# Patient Record
Sex: Female | Born: 1960 | Hispanic: Yes | Marital: Married | Smoking: Never smoker
Health system: Southern US, Community
[De-identification: ages and names within clinical notes are randomized; demographics above are authoritative.]

## PROBLEM LIST (undated history)

## (undated) DIAGNOSIS — I10 Essential (primary) hypertension: Secondary | ICD-10-CM

## (undated) DIAGNOSIS — C801 Malignant (primary) neoplasm, unspecified: Secondary | ICD-10-CM

---

## 2005-07-27 ENCOUNTER — Emergency Department (HOSPITAL_COMMUNITY): Admission: EM | Admit: 2005-07-27 | Discharge: 2005-07-27 | Payer: Self-pay | Admitting: Family Medicine

## 2006-09-06 ENCOUNTER — Emergency Department (HOSPITAL_COMMUNITY): Admission: EM | Admit: 2006-09-06 | Discharge: 2006-09-06 | Payer: Self-pay | Admitting: Family Medicine

## 2006-12-05 ENCOUNTER — Emergency Department (HOSPITAL_COMMUNITY): Admission: EM | Admit: 2006-12-05 | Discharge: 2006-12-05 | Payer: Self-pay | Admitting: Family Medicine

## 2010-02-27 ENCOUNTER — Ambulatory Visit: Payer: Self-pay | Admitting: Diagnostic Radiology

## 2010-02-27 ENCOUNTER — Emergency Department (HOSPITAL_BASED_OUTPATIENT_CLINIC_OR_DEPARTMENT_OTHER): Admission: EM | Admit: 2010-02-27 | Discharge: 2010-02-27 | Payer: Self-pay | Admitting: Emergency Medicine

## 2011-06-29 ENCOUNTER — Other Ambulatory Visit (HOSPITAL_COMMUNITY): Payer: Self-pay | Admitting: Family Medicine

## 2011-06-29 DIAGNOSIS — Z139 Encounter for screening, unspecified: Secondary | ICD-10-CM

## 2011-07-01 ENCOUNTER — Ambulatory Visit (HOSPITAL_COMMUNITY)
Admission: RE | Admit: 2011-07-01 | Discharge: 2011-07-01 | Disposition: A | Discharge status: Home / Self Care | Payer: Self-pay | Source: Ambulatory Visit | Attending: Family Medicine | Admitting: Family Medicine

## 2011-07-01 DIAGNOSIS — Z139 Encounter for screening, unspecified: Secondary | ICD-10-CM

## 2015-01-21 ENCOUNTER — Other Ambulatory Visit (HOSPITAL_COMMUNITY): Payer: Self-pay | Admitting: Family

## 2015-01-21 DIAGNOSIS — Z1231 Encounter for screening mammogram for malignant neoplasm of breast: Secondary | ICD-10-CM

## 2015-01-27 ENCOUNTER — Ambulatory Visit (HOSPITAL_COMMUNITY)
Admission: RE | Admit: 2015-01-27 | Discharge: 2015-01-27 | Disposition: A | Discharge status: Home / Self Care | Payer: Self-pay | Source: Ambulatory Visit | Attending: Family | Admitting: Family

## 2015-01-27 DIAGNOSIS — Z1231 Encounter for screening mammogram for malignant neoplasm of breast: Secondary | ICD-10-CM

## 2018-03-15 ENCOUNTER — Ambulatory Visit (INDEPENDENT_AMBULATORY_CARE_PROVIDER_SITE_OTHER): Payer: Worker's Compensation

## 2018-03-15 ENCOUNTER — Ambulatory Visit (INDEPENDENT_AMBULATORY_CARE_PROVIDER_SITE_OTHER): Payer: Worker's Compensation | Admitting: Orthopaedic Surgery

## 2018-03-15 ENCOUNTER — Encounter (INDEPENDENT_AMBULATORY_CARE_PROVIDER_SITE_OTHER): Payer: Self-pay | Admitting: Orthopaedic Surgery

## 2018-03-15 VITALS — BP 136/81 | HR 95 | Ht 61.0 in | Wt 164.0 lb

## 2018-03-15 DIAGNOSIS — M25511 Pain in right shoulder: Secondary | ICD-10-CM | POA: Diagnosis not present

## 2018-03-15 DIAGNOSIS — M545 Low back pain: Secondary | ICD-10-CM

## 2018-03-15 DIAGNOSIS — M25561 Pain in right knee: Secondary | ICD-10-CM

## 2018-03-15 DIAGNOSIS — M41126 Adolescent idiopathic scoliosis, lumbar region: Secondary | ICD-10-CM

## 2018-03-15 MED ORDER — DICLOFENAC SODIUM 75 MG PO TBEC: 75.0000 mg | DELAYED_RELEASE_TABLET | Freq: Two times a day (BID) | ORAL | 0 refills | Status: DC

## 2018-03-15 NOTE — Progress Notes (Signed)
Office Visit Note/Orthopedic consultation   Patient: Michele Arnold           Date of Birth: 14-Oct-1961           MRN: [id number] Visit Date: 03/15/2018              Requested by: Royce Macadamia D., PA-C [address] [place], [place] [address] PCP: Raiford Simmonds., PA-C   Assessment & Plan: Visit Diagnoses:  1. Acute pain of right shoulder   2. Acute pain of right knee   3. Acute right-sided low back pain, with sciatica presence unspecified   4. Adolescent idiopathic scoliosis of lumbar region     Plan: Patient can continue working as she was with some sitting and some standing.  I plan to check her back again in 1 month.  Voltaren 75 mg 1 p.o. twice daily with food prescribed.  She can stop the Flexeril.  She can continue to use some heat as she has been doing after work. Thank you for the opportunity to see her in consultation. I plan to recheck her in 1 month. We discussed through the interpreter that her pre-existing scoliosis and lumbar facet arthritis may take longer for her symptoms to resolve.   Follow-Up Instructions: Return in about 1 month (around 04/15/2018).   Orders:  Orders Placed This Encounter  Procedures  . XR Lumbar Spine 2-3 Views  . XR Knee 1-2 Views Right  . XR Shoulder Right   Meds ordered this encounter  Medications  . diclofenac (VOLTAREN) 75 MG EC tablet    Sig: Take 1 tablet (75 mg total) by mouth 2 (two) times daily.    Dispense:  60 tablet    Refill:  0    Workers comp injury      Procedures: No procedures performed   Clinical Data: No additional findings.   Subjective: Chief Complaint  Patient presents with  . Right Knee - Pain  . Spine - Pain    HPI 57 year old female injured on the job when she was hit by a pallet jack hitting her right knee causing her to fall backwards.  She states she has had pain all along the entire right side since the injury on 02/04/2018.  She has had pain in her right shoulder pain  anterolaterally over her right knee also pain in the lower back where she landed.  She denies any problems with any of these 3 areas before the accident.  [facility] anti-inflammatories.  She has been back at work and she works as a Radiation protection practitioner.  She states when she works she has increased pain.  She is applied some heat to her knee night.  She had ecchymosis over her knee but this is resolved.  She states the Flexeril made her sleepy.  She also has some Tylenol.  Review of Systems 14 point review of systems updated.  Negative as it pertains HPI.   Objective: Vital Signs: BP 136/81   Pulse 95   Ht 5\' 1"  (1.549 m)   Wt 164 lb (74.4 kg)   BMI 30.99 kg/m   Physical Exam  Constitutional: She is oriented to person, place, and time. She appears well-developed.  HENT:  Head: Normocephalic.  Right Ear: External ear normal.  Left Ear: External ear normal.  Eyes: Pupils are equal, round, and reactive to light.  Neck: No tracheal deviation present. No thyromegaly present.  Cardiovascular: Normal rate.  Pulmonary/Chest: Effort normal.  Abdominal: Soft.  Neurological: She is alert and oriented to person, place, and time.  Skin: Skin is warm and dry.  Psychiatric: She has a normal mood and affect. Her behavior is normal.    Ortho Exam patient can get a right arm overhead negative impingement.  No atrophy epic extremities.  Normal hip range of motion negative straight leg raising 90 degrees.  She is tender over [name]'s tubercle which is slightly more prominent on the right knee than left knee.  Normal patellar tracking knee reaches full extension good flexion no knee effusion.  ACL PCL collateral ligament exam is normal.  No rash over exposed skin.  She has tenderness with palpation of the lumbar spine no sciatic notch tenderness.  When she ambulates she tends to put her hand on her mid lumbar region and states this makes her feel better.  Lower extremity reflexes  are 2+ and symmetrical.  No isolated motor weakness of the lower extremities.  Specialty Comments:  No specialty comments available.  Imaging: Xr Knee 1-2 Views Right  Result Date: 03/15/2018 Standing AP x-rays both knees lateral right knee obtained and reviewed.  This shows minimal joint narrowing minimal spurring negative for acute changes. Impression right knee negative for acute changes post injury with mild joint narrowing.  Xr Lumbar Spine 2-3 Views  Result Date: 03/15/2018 AP lateral lumbar spine x-rays obtained and reviewed.  This shows significant scoliosis with approximately 40 degrees curve.  Multilevel endplate spurs.  Negative for acute fracture. Impression: Scoliosis with facet arthropathy and disc space narrowing.  Negative for acute fracture.  Xr Shoulder Right  Result Date: 03/15/2018 AP lateral right shoulder x-rays obtained and reviewed negative for acute changes.  Ribs are normal .  Negative for acute changes. Impression: Normal right shoulder x-rays.    PMFS History: There are no active problems to display for this patient.  History reviewed. No pertinent past medical history.  History reviewed. No pertinent family history.  History reviewed. No pertinent surgical history. Social History   Occupational History  . Not on file  Tobacco Use  . Smoking status: Never Smoker  . Smokeless tobacco: Never Used  Substance and Sexual Activity  . Alcohol use: Not Currently  . Drug use: Not on file  . Sexual activity: Not on file

## 2018-04-18 ENCOUNTER — Telehealth (INDEPENDENT_AMBULATORY_CARE_PROVIDER_SITE_OTHER): Payer: Self-pay

## 2018-04-18 ENCOUNTER — Encounter (INDEPENDENT_AMBULATORY_CARE_PROVIDER_SITE_OTHER): Payer: Self-pay | Admitting: Orthopaedic Surgery

## 2018-04-18 ENCOUNTER — Ambulatory Visit (INDEPENDENT_AMBULATORY_CARE_PROVIDER_SITE_OTHER): Payer: Worker's Compensation | Admitting: Orthopaedic Surgery

## 2018-04-18 VITALS — BP 173/94 | HR 84 | Ht 61.0 in | Wt 167.0 lb

## 2018-04-18 DIAGNOSIS — S8001XS Contusion of right knee, sequela: Secondary | ICD-10-CM | POA: Diagnosis not present

## 2018-04-18 DIAGNOSIS — M4156 Other secondary scoliosis, lumbar region: Secondary | ICD-10-CM | POA: Diagnosis not present

## 2018-04-18 NOTE — Telephone Encounter (Signed)
-----   Message from Marybelle Killings, MD sent at 04/18/2018  9:40 AM EDT ----- Cc to W/C

## 2018-04-18 NOTE — Progress Notes (Signed)
Office Visit Note   Patient: Michele Arnold           Date of Birth: [birth year]           MRN: [id number] Visit Date: 04/18/2018              Requested by: Michele Macadamia D., PA-C [address] [place], [place] [address] PCP: Michele Simmonds., PA-C   Assessment & Plan: Visit Diagnoses:  1. Contusion of right knee, sequela   2. 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  She is worked there for several years.  We will schedule her for some physical therapy and I plan to recheck her back in 8 weeks.  We discussed the enlargement of [name]'s tubercle where she fell and I discussed with her that x-rays show no fracture she does not have any evidence of ligament or meniscal injury to her knee.  She continues to have problems with her back where she has scoliosis, facet degenerative changes and endplate spurring and narrowing negative for acute fracture.  Recheck in 8 weeks.  She states the Voltaren has not really helped her.  Will try some ibuprofen 800 mg p.o. twice daily with meals.  60 tablets prescribed with refill x2.  Patient mentioned that she had seen a chiropractor on her own.  Work slip given for continue some sitting some standing work.  I will check her again in 8 weeks.  I discussed with interpreter the x-ray results for her back her knee and her shoulder which we reviewed again with her today.  Follow-Up Instructions: Return in about 8 weeks (around 06/13/2018).   Orders:  No orders of the defined types were placed in this encounter.  No orders of the defined types were placed in this encounter.     Procedures: No procedures performed   Clinical Data: No additional findings.   Subjective: Chief Complaint  Patient presents with  . Right Shoulder - Follow-up  . Lower Back - Follow-up  . Right Knee - Follow-up    HPI 57 year old  female returns post on-the-job injury on 02/04/2018 when she was hit by a pallet jack hitting her right knee causing her to fall backwards.  She had some pain in her shoulder particularly with outstretched reaching although she is able to get her arm up overhead.  She states she also has some pain in her neck.  She had been on some Voltaren and has 2 tablets left she states she is not sure it really helped her.  She has been doing the packing job since 2014.  Review of Systems updated unchanged from 03/15/2018.   Objective: Vital Signs: BP (!) 173/94   Pulse 84   Ht 5\' 1"  (1.549 m)   Wt 167 lb (75.8 kg)   BMI 31.55 kg/m   Physical Exam  Constitutional: She is oriented to person, place, and time. She appears well-developed.  HENT:  Head: Normocephalic.  Right Ear: External ear normal.  Left Ear: External ear normal.  Eyes: Pupils are equal, round, and reactive to light.  Neck: No tracheal deviation present. No thyromegaly present.  Cardiovascular: Normal rate.  Pulmonary/Chest: Effort normal.  Abdominal: Soft.  Neurological: She is alert and oriented to person, place, and time.  Skin: Skin is warm and dry.  Psychiatric: She has a normal mood and  affect. Her behavior is normal.    Ortho Exam patient complains of some pain with cervical compression she also complains of pain with cervical distraction no brachial plexus tenderness negative Spurling.  She can reach arm up overhead negative drop arm test negative impingement upper extremity reflexes are 2+.  She still has tenderness over the right [name]'s tubercle at her knee no knee effusion collateral ligaments are stable minimal crepitus with knee range of motion.  She has a slow stride gait and has slight right knee limp with ambulation.  She has tenderness over the lumbosacral junction also of the sacrum.  Patient states she is noticed the [name]'s tubercle and the right knee is more prominent than the left.  Iliotibial band is normal at  the femoral condyle.  Specialty Comments:  No specialty comments available.  Imaging: No results found.   PMFS History: There are no active problems to display for this patient.  No past medical history on file.  No family history on file.  No past surgical history on file. Social History   Occupational History  . Not on file  Tobacco Use  . Smoking status: Never Smoker  . Smokeless tobacco: Never Used  Substance and Sexual Activity  . Alcohol use: Not Currently  . Drug use: Not on file  . Sexual activity: Not on file

## 2018-04-18 NOTE — Telephone Encounter (Signed)
Office note faxed to [facility] @ [phone]

## 2018-05-31 ENCOUNTER — Telehealth (INDEPENDENT_AMBULATORY_CARE_PROVIDER_SITE_OTHER): Payer: Self-pay | Admitting: Orthopaedic Surgery

## 2018-05-31 NOTE — Telephone Encounter (Signed)
[facility] would like patients referral for physical therapy faxed to them so they can get her scheduled. Fax to [phone].  Reference # [id number]

## 2018-05-31 NOTE — Telephone Encounter (Signed)
faxed

## 2018-06-27 ENCOUNTER — Encounter (INDEPENDENT_AMBULATORY_CARE_PROVIDER_SITE_OTHER): Payer: Self-pay | Admitting: Orthopaedic Surgery

## 2018-06-27 ENCOUNTER — Ambulatory Visit (INDEPENDENT_AMBULATORY_CARE_PROVIDER_SITE_OTHER): Payer: Worker's Compensation | Admitting: Orthopaedic Surgery

## 2018-06-27 VITALS — BP 153/90 | HR 82 | Ht 61.0 in | Wt 167.0 lb

## 2018-06-27 DIAGNOSIS — S8002XD Contusion of left knee, subsequent encounter: Secondary | ICD-10-CM

## 2018-06-27 NOTE — Progress Notes (Signed)
Office Visit Note   Patient: Michele Arnold           Date of Birth: 1961-05-01           MRN: [id number] Visit Date: 06/27/2018              Requested by: Royce Macadamia D., PA-C [address] [place], [place] [address] PCP: Raiford Simmonds., PA-C   Assessment & Plan: Visit Diagnoses:  1. 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  With a standing job she is gained some weight.  She still has some symptoms in her knee from the knee contusion and I plan to recheck her back again in 4 weeks.  If she still has slight swelling in her lab work from her doctor comes back normal then we can consider intra-articular cortisone injection in her right knee.  We discussed working on dieting and awake loss plan to lose some weight which also should help her with some of her knee symptoms.  She has lab work coming up at her annual physical in 2 weeks and will make sure before she returns in 4 weeks that her sugar test was normal.  Follow-Up Instructions: Return in about 4 weeks (around 07/25/2018).   Orders:  No orders of the defined types were placed in this encounter.  No orders of the defined types were placed in this encounter.     Procedures: No procedures performed   Clinical Data: No additional findings.   Subjective: Chief Complaint  Patient presents with  . Right Knee - Follow-up    OTJI 02/04/18  . Lower Back - Follow-up    OTJI 02/04/18    HPI 57 year old female returns post on-the-job injury in March she is here with an interpreter.  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  She is made some improvement has been going to physical therapy is been taking ibuprofen.  Voltaren really did not work as well.  She is  noticed some swelling in her right knee at the end of the day no swelling in the left knee.  She states that she has upcoming appointment for yearly physical and for lab work coming up in a couple weeks.  She denies chills or fever no bowel bladder symptoms.  Review of Systems reviewed updated unchanged from last office visit 14 point systems updated.   Objective: Vital Signs: BP (!) 153/90   Pulse 82   Ht 5\' 1"  (1.549 m)   Wt 167 lb (75.8 kg)   BMI 31.55 kg/m   Physical Exam  Constitutional: She is oriented to person, place, and time. She appears well-developed.  HENT:  Head: Normocephalic.  Right Ear: External ear normal.  Left Ear: External ear normal.  Eyes: Pupils are equal, round, and reactive to light.  Neck: No tracheal deviation present. No thyromegaly present.  Cardiovascular: Normal rate.  Pulmonary/Chest: Effort normal.  Abdominal: Soft.  Neurological: She is alert and oriented to person, place, and time.  Skin: Skin is warm and dry.  Psychiatric: She has a normal mood and affect. Her behavior is normal.    Ortho Exam patient has trace knee effusion on the right slight crepitus with knee flexion extension  opposite left knee shows no swelling no instability.  She still has slight tenderness anteriorly.  Specialty Comments:  No specialty comments available.  Imaging: No results found.   PMFS History: There are no active problems to display for this patient.  No past medical history on file.  No family history on file.  No past surgical history on file. Social History   Occupational History  . Not on file  Tobacco Use  . Smoking status: Never Smoker  . Smokeless tobacco: Never Used  Substance and Sexual Activity  . Alcohol use: Not Currently  . Drug use: Not on file  . Sexual activity: Not on file

## 2018-06-29 ENCOUNTER — Telehealth (INDEPENDENT_AMBULATORY_CARE_PROVIDER_SITE_OTHER): Payer: Self-pay

## 2018-06-29 NOTE — Telephone Encounter (Signed)
Faxed the 06/27/18 office note and work note to [facility] per her request

## 2018-07-26 ENCOUNTER — Encounter (INDEPENDENT_AMBULATORY_CARE_PROVIDER_SITE_OTHER): Payer: Self-pay | Admitting: Orthopaedic Surgery

## 2018-07-26 ENCOUNTER — Ambulatory Visit (INDEPENDENT_AMBULATORY_CARE_PROVIDER_SITE_OTHER): Payer: Worker's Compensation | Admitting: Orthopaedic Surgery

## 2018-07-26 VITALS — BP 163/91 | HR 72 | Ht 61.0 in | Wt 167.0 lb

## 2018-07-26 DIAGNOSIS — S8002XD Contusion of left knee, subsequent encounter: Secondary | ICD-10-CM

## 2018-07-26 DIAGNOSIS — M545 Low back pain: Secondary | ICD-10-CM | POA: Diagnosis not present

## 2018-07-26 NOTE — Progress Notes (Signed)
Office Visit Note   Patient: Michele Arnold           Date of Birth: 1961-02-25           MRN: [id number] Visit Date: 07/26/2018              Requested by: Royce Macadamia D., PA-C [address] [place], [place] [address] PCP: Raiford Simmonds., PA-C   Assessment & Plan: Visit Diagnoses:  1. Contusion of left knee, subsequent encounter   2. Low back pain, unspecified back pain laterality, unspecified chronicity, with sciatica presence unspecified     Plan: She is back at work her knee x-rays demonstrate some mild degenerative changes.  She might get some improvement with intra-articular cortisone injection but we need to see her PCP first and she has reported history of diabetes with no treatment.  We reviewed previous x-rays of her knee as well as lumbar spine that shows the lumbar scoliosis with facet and endplate degenerative changes.  She understands this is a chronic problem that she will work on a walking program dieting, weight loss and control of diabetes.  Once her diabetes are evaluated and controlled she can return for an intra-articular injection if she so desired.  Otherwise office follow-up PRN.  Follow-Up Instructions: Return if symptoms worsen or fail to improve.   Orders:  No orders of the defined types were placed in this encounter.  No orders of the defined types were placed in this encounter.     Procedures: No procedures performed   Clinical Data: No additional findings.   Subjective: Chief Complaint  Patient presents with  . Left Knee - Follow-up    OTJI 02/04/18  . Lower Back - Follow-up    HPI 57 year old female returns for ongoing problems with her right knee and also pain in her back.  She had an on-the-job injury 02/04/2018.  She is used ibuprofen 800 mg twice daily has some discomfort after walking more than 5 minutes.  She has not been able to go see a doctor since she checked the payment was excessive.  We mentioned other providers  available to help check her blood sugar and make recommendations for care.  She asked about intra-articular injection today in her knee and we reviewed that she needs to make sure that her blood sugar is stable and needs to see a primary care provider first.  Review of Systems 14 point update unchanged from last office visit other than as mentioned in HPI.   Objective: Vital Signs: BP (!) 163/91   Pulse 72   Ht 5\' 1"  (1.549 m)   Wt 167 lb (75.8 kg)   BMI 31.55 kg/m   Physical Exam  Constitutional: She is oriented to person, place, and time. She appears well-developed.  HENT:  Head: Normocephalic.  Right Ear: External ear normal.  Left Ear: External ear normal.  Eyes: Pupils are equal, round, and reactive to light.  Neck: No tracheal deviation present. No thyromegaly present.  Cardiovascular: Normal rate.  Pulmonary/Chest: Effort normal.  Abdominal: Soft.  Neurological: She is alert and oriented to person, place, and time.  Skin: Skin is warm and dry.  Psychiatric: She has a normal mood and affect. Her behavior is normal.    Ortho Exam lumbar curvature patient ambulates without a limp.  She has mild crepitus with knee extension collateral ligaments are stable no pitting edema.  Pedal pulses are intact no cellulitis.  Normal hip range of motion.  Specialty  Comments:  No specialty comments available.  Imaging: No results found.   PMFS History: There are no active problems to display for this patient.  No past medical history on file.  No family history on file.  No past surgical history on file. Social History   Occupational History  . Not on file  Tobacco Use  . Smoking status: Never Smoker  . Smokeless tobacco: Never Used  Substance and Sexual Activity  . Alcohol use: Not Currently  . Drug use: Not on file  . Sexual activity: Not on file

## 2018-07-27 ENCOUNTER — Telehealth (INDEPENDENT_AMBULATORY_CARE_PROVIDER_SITE_OTHER): Payer: Self-pay

## 2018-07-27 NOTE — Telephone Encounter (Signed)
Faxed office note to [facility] @ [employer] [phone]

## 2018-07-27 NOTE — Telephone Encounter (Signed)
-----   Message from Marybelle Killings, MD sent at 07/26/2018  9:52 AM EDT ----- Cc to [employer]  Thank you

## 2018-07-28 ENCOUNTER — Telehealth (INDEPENDENT_AMBULATORY_CARE_PROVIDER_SITE_OTHER): Payer: Self-pay

## 2018-07-28 NOTE — Telephone Encounter (Signed)
Faxed the 07/25/18 office note to adj per her request [phone]

## 2018-08-01 ENCOUNTER — Telehealth (INDEPENDENT_AMBULATORY_CARE_PROVIDER_SITE_OTHER): Payer: Self-pay

## 2018-08-01 NOTE — Telephone Encounter (Signed)
Received another vm requesting the 07/25/18 office note which I faxed on 07/28/18 and got confirmation that it went through, Refaxed

## 2021-05-09 ENCOUNTER — Ambulatory Visit
Admission: EM | Admit: 2021-05-09 | Discharge: 2021-05-09 | Disposition: A | Discharge status: Home / Self Care | Payer: Self-pay | Attending: Emergency Medicine | Admitting: Emergency Medicine

## 2021-05-09 ENCOUNTER — Other Ambulatory Visit: Payer: Self-pay

## 2021-05-09 ENCOUNTER — Encounter: Payer: Self-pay | Admitting: Emergency Medicine

## 2021-05-09 DIAGNOSIS — M5441 Lumbago with sciatica, right side: Secondary | ICD-10-CM

## 2021-05-09 MED ORDER — KETOROLAC TROMETHAMINE 30 MG/ML IJ SOLN
30.0000 mg | Freq: Once | INTRAMUSCULAR | Status: AC
  Administered 2021-05-09: 30 mg via INTRAMUSCULAR

## 2021-05-09 MED ORDER — TIZANIDINE HCL 2 MG PO TABS: 2.0000 mg | ORAL_TABLET | Freq: Four times a day (QID) | ORAL | 0 refills | Status: DC | PRN

## 2021-05-09 MED ORDER — PREDNISONE 10 MG PO TABS: ORAL_TABLET | ORAL | 0 refills | Status: DC

## 2021-05-09 NOTE — Discharge Instructions (Addendum)
Da Ardelia Mems inyeccion de toradol Empice prednisone en la manana con comida- toma 6 pastillas dia 1, 5 pastillas dia 2, 4 pastillas dia 3, 3 pastillas dia 4, 2 pastillas dia 5, 1 pastilla dia 6 [place] tizanidine en la noche- es una relajante de musculos, causa suenos Continue a movar Regrese si no mejoran

## 2021-05-09 NOTE — ED Provider Notes (Signed)
[facility]    CSN: [id number] Arrival date & time: 05/09/21  1423      History   Chief Complaint Chief Complaint  Patient presents with   Back Pain    HPI Michele Arnold is a 60 y.o. female presenting today for evaluation of back pain.  Reports back pain x2 weeks.  Denies injury or trauma.  Pain is radiating into right groin and leg with a hot/burning sensation.  She reports remote falls, but no recent injury.  Was seen previously and given Soma and gabapentin without relief of symptoms.  Denies urinary symptoms.  HPI  History reviewed. No pertinent past medical history.  There are no problems to display for this patient.   History reviewed. No pertinent surgical history.  OB History   No obstetric history on file.      Home Medications    Prior to Admission medications   Medication Sig Start Date End Date Taking? Authorizing Provider  predniSONE (DELTASONE) 10 MG tablet Begin with 6 tabs on day 1, 5 tab on day 2, 4 tab on day 3, 3 tab on day 4, 2 tab on day 5, 1 tab on day 6-take with food 05/09/21  Yes Neesa Knapik C, PA-C  tiZANidine (ZANAFLEX) 2 MG tablet Take 1-2 tablets (2-4 mg total) by mouth every 6 (six) hours as needed for muscle spasms. 05/09/21  Yes Mane Consolo C, PA-C  ibuprofen (ADVIL,MOTRIN) 800 MG tablet Take 800 mg by mouth 2 (two) times daily with a meal. 06/22/18   [provider]    Family History History reviewed. No pertinent family history.  Social History Social History   Tobacco Use   Smoking status: Never   Smokeless tobacco: Never  Substance Use Topics   Alcohol use: Not Currently     Allergies   Patient has no known allergies.   Review of Systems Review of Systems  Constitutional:  Negative for fatigue and fever.  HENT:  Negative for mouth sores.   Eyes:  Negative for visual disturbance.  Respiratory:  Negative for shortness of breath.   Cardiovascular:  Negative for chest pain.   Gastrointestinal:  Negative for abdominal pain, nausea and vomiting.  Genitourinary:  Negative for genital sores.  Musculoskeletal:  Positive for back pain and myalgias. Negative for arthralgias and joint swelling.  Skin:  Negative for color change, rash and wound.  Neurological:  Negative for dizziness, weakness, light-headedness and headaches.    Physical Exam Triage Vital Signs ED Triage Vitals  Enc Vitals Group     BP      Pulse      Resp      Temp      Temp src      SpO2      Weight      Height      Head Circumference      Peak Flow      Pain Score      Pain Loc      Pain Edu?      Excl. in [place]?    No data found.  Updated Vital Signs BP 127/61 (BP Location: Left Arm)   Pulse 81   Temp 98.8 F (37.1 C) (Oral)   Resp 18   SpO2 96%   Visual Acuity Right Eye Distance:   Left Eye Distance:   Bilateral Distance:    Right Eye Near:   Left Eye Near:    Bilateral Near:     Physical Exam Vitals  and nursing note reviewed.  Constitutional:      Appearance: She is well-developed.     Comments: No acute distress  HENT:     Head: Normocephalic and atraumatic.     Nose: Nose normal.  Eyes:     Conjunctiva/sclera: Conjunctivae normal.  Cardiovascular:     Rate and Rhythm: Normal rate.  Pulmonary:     Effort: Pulmonary effort is normal. No respiratory distress.  Abdominal:     General: There is no distension.  Musculoskeletal:        General: Normal range of motion.     Cervical back: Neck supple.     Comments: Back: Tender to palpation to lower back, increased tenderness throughout right lumbar musculature extending into right glutes and, strength at hips and knees 5/5 and equal bilaterally, patellar reflex 2+ bilaterally  Skin:    General: Skin is warm and dry.  Neurological:     Mental Status: She is alert and oriented to person, place, and time.     UC Treatments / Results  Labs (all labs ordered are listed, but only abnormal results are  displayed) Labs Reviewed - No data to display  EKG   Radiology No results found.  Procedures Procedures (including critical care time)  Medications Ordered in UC Medications  ketorolac (TORADOL) 30 MG/ML injection 30 mg (30 mg Intramuscular Given 05/09/21 1512)    Initial Impression / Assessment and Plan / UC Course  I have reviewed the triage vital signs and the nursing notes.  Pertinent labs & imaging results that were available during my care of the patient were reviewed by me and considered in my medical decision making (see chart for details).     Right-sided lower back pain with right-sided radicular distribution-providing Toradol prior to discharge, prednisone taper x6 days, supplement tizanidine at home and bedtime, encourage patient to continue to move and gentle stretching, avoid heavy lifting, ice and heat.  Sports medicine follow-up if not improving.  Discussed strict return precautions. Patient verbalized understanding and is agreeable with plan.  Final Clinical Impressions(s) / UC Diagnoses   Final diagnoses:  Acute right-sided low back pain with right-sided sciatica     Discharge Instructions      Da Ardelia Mems inyeccion de toradol [place] prednisone en la manana con comida- toma 6 pastillas dia 1, 5 pastillas dia 2, 4 pastillas dia 3, 3 pastillas dia 4, 2 pastillas dia 5, 1 pastilla dia 6 [place] tizanidine en la noche- es una relajante de musculos, causa suenos Continue a movar Regrese si no mejoran     ED Prescriptions     Medication Sig Dispense Auth. Provider   predniSONE (DELTASONE) 10 MG tablet Begin with 6 tabs on day 1, 5 tab on day 2, 4 tab on day 3, 3 tab on day 4, 2 tab on day 5, 1 tab on day 6-take with food 21 tablet Viraaj Vorndran C, PA-C   tiZANidine (ZANAFLEX) 2 MG tablet Take 1-2 tablets (2-4 mg total) by mouth every 6 (six) hours as needed for muscle spasms. 30 tablet Adalia Pettis, [place] C, PA-C      PDMP not reviewed this encounter.    Janith Lima, [place] 05/09/21 1517

## 2021-05-09 NOTE — ED Triage Notes (Signed)
Pt here for lower right sided back pain with radiation down right leg x 2 weeks

## 2021-07-21 ENCOUNTER — Inpatient Hospital Stay (HOSPITAL_COMMUNITY): Payer: Self-pay

## 2021-07-21 ENCOUNTER — Emergency Department (HOSPITAL_COMMUNITY): Payer: Self-pay

## 2021-07-21 ENCOUNTER — Inpatient Hospital Stay (HOSPITAL_COMMUNITY)
Admission: EM | Admit: 2021-07-21 | Discharge: 2021-07-24 | DRG: 418 | Disposition: A | Discharge status: Home / Self Care | Payer: Self-pay | Attending: Internal Medicine | Admitting: Internal Medicine

## 2021-07-21 ENCOUNTER — Other Ambulatory Visit: Payer: Self-pay

## 2021-07-21 ENCOUNTER — Encounter (HOSPITAL_COMMUNITY): Payer: Self-pay

## 2021-07-21 DIAGNOSIS — K8050 Calculus of bile duct without cholangitis or cholecystitis without obstruction: Secondary | ICD-10-CM

## 2021-07-21 DIAGNOSIS — R1010 Upper abdominal pain, unspecified: Secondary | ICD-10-CM

## 2021-07-21 DIAGNOSIS — Z20822 Contact with and (suspected) exposure to covid-19: Secondary | ICD-10-CM | POA: Diagnosis present

## 2021-07-21 DIAGNOSIS — E876 Hypokalemia: Secondary | ICD-10-CM | POA: Diagnosis present

## 2021-07-21 DIAGNOSIS — Z6831 Body mass index (BMI) 31.0-31.9, adult: Secondary | ICD-10-CM

## 2021-07-21 DIAGNOSIS — R310 Gross hematuria: Secondary | ICD-10-CM

## 2021-07-21 DIAGNOSIS — K8001 Calculus of gallbladder with acute cholecystitis with obstruction: Secondary | ICD-10-CM | POA: Diagnosis present

## 2021-07-21 DIAGNOSIS — K851 Biliary acute pancreatitis without necrosis or infection: Principal | ICD-10-CM | POA: Diagnosis present

## 2021-07-21 DIAGNOSIS — R739 Hyperglycemia, unspecified: Secondary | ICD-10-CM | POA: Diagnosis present

## 2021-07-21 DIAGNOSIS — K8020 Calculus of gallbladder without cholecystitis without obstruction: Secondary | ICD-10-CM

## 2021-07-21 DIAGNOSIS — R7989 Other specified abnormal findings of blood chemistry: Secondary | ICD-10-CM

## 2021-07-21 DIAGNOSIS — R109 Unspecified abdominal pain: Secondary | ICD-10-CM | POA: Diagnosis present

## 2021-07-21 DIAGNOSIS — I10 Essential (primary) hypertension: Secondary | ICD-10-CM | POA: Diagnosis present

## 2021-07-21 DIAGNOSIS — E669 Obesity, unspecified: Secondary | ICD-10-CM | POA: Diagnosis present

## 2021-07-21 LAB — URINALYSIS, ROUTINE W REFLEX MICROSCOPIC
Glucose, UA: NEGATIVE mg/dL
Ketones, ur: 5 mg/dL — AB
Nitrite: NEGATIVE
Protein, ur: 30 mg/dL — AB
RBC / HPF: >50 RBC/hpf — ABNORMAL HIGH (ref 0–5)
Specific Gravity, Urine: 1.025 (ref 1.005–1.030)
pH: 6 (ref 5.0–8.0)

## 2021-07-21 LAB — HEMOGLOBIN A1C
Hgb A1c MFr Bld: 5.6 % (ref 4.8–5.6)
Mean Plasma Glucose: 114.02 mg/dL

## 2021-07-21 LAB — CBC WITH DIFFERENTIAL/PLATELET
Abs Immature Granulocytes: 0.01 10*3/uL (ref 0.00–0.07)
Basophils Absolute: 0 10*3/uL (ref 0.0–0.1)
Basophils Relative: 0 %
Eosinophils Absolute: 0.1 10*3/uL (ref 0.0–0.5)
Eosinophils Relative: 1 %
HCT: 43.2 % (ref 36.0–46.0)
Hemoglobin: 14.3 g/dL (ref 12.0–15.0)
Immature Granulocytes: 0 %
Lymphocytes Relative: 34 %
Lymphs Abs: 2.1 10*3/uL (ref 0.7–4.0)
MCH: 31.6 pg (ref 26.0–34.0)
MCHC: 33.1 g/dL (ref 30.0–36.0)
MCV: 95.6 fL (ref 80.0–100.0)
Monocytes Absolute: 0.6 10*3/uL (ref 0.1–1.0)
Monocytes Relative: 9 %
Neutro Abs: 3.4 10*3/uL (ref 1.7–7.7)
Neutrophils Relative %: 56 %
Platelets: 446 10*3/uL — ABNORMAL HIGH (ref 150–400)
RBC: 4.52 MIL/uL (ref 3.87–5.11)
RDW: 12.6 % (ref 11.5–15.5)
WBC: 6.1 10*3/uL (ref 4.0–10.5)
nRBC: 0 % (ref 0.0–0.2)

## 2021-07-21 LAB — LIPASE, BLOOD: Lipase: 33 U/L (ref 11–51)

## 2021-07-21 LAB — COMPREHENSIVE METABOLIC PANEL
ALT: 530 U/L — ABNORMAL HIGH (ref 0–44)
AST: 1006 U/L — ABNORMAL HIGH (ref 15–41)
Albumin: 4.3 g/dL (ref 3.5–5.0)
Alkaline Phosphatase: 138 U/L — ABNORMAL HIGH (ref 38–126)
Anion gap: 10 (ref 5–15)
BUN: 14 mg/dL (ref 6–20)
CO2: 25 mmol/L (ref 22–32)
Calcium: 9.5 mg/dL (ref 8.9–10.3)
Chloride: 102 mmol/L (ref 98–111)
Creatinine, Ser: 0.61 mg/dL (ref 0.44–1.00)
GFR, Estimated: >60 mL/min (ref >60)
Glucose, Bld: 179 mg/dL — ABNORMAL HIGH (ref 70–99)
Potassium: 3.4 mmol/L — ABNORMAL LOW (ref 3.5–5.1)
Sodium: 137 mmol/L (ref 135–145)
Total Bilirubin: 3.4 mg/dL — ABNORMAL HIGH (ref 0.3–1.2)
Total Protein: 8 g/dL (ref 6.5–8.1)

## 2021-07-21 LAB — IRON AND TIBC
Iron: 197 ug/dL — ABNORMAL HIGH (ref 28–170)
Saturation Ratios: 52 % — ABNORMAL HIGH (ref 10.4–31.8)
TIBC: 378 ug/dL (ref 250–450)
UIBC: 181 ug/dL

## 2021-07-21 LAB — HEPATITIS PANEL, ACUTE
HCV Ab: NONREACTIVE
Hep A IgM: NONREACTIVE
Hep B C IgM: NONREACTIVE
Hepatitis B Surface Ag: NONREACTIVE

## 2021-07-21 LAB — MAGNESIUM: Magnesium: 2.3 mg/dL (ref 1.7–2.4)

## 2021-07-21 LAB — RESP PANEL BY RT-PCR (FLU A&B, COVID) ARPGX2
Influenza A by PCR: NEGATIVE
Influenza B by PCR: NEGATIVE
SARS Coronavirus 2 by RT PCR: NEGATIVE

## 2021-07-21 LAB — FERRITIN: Ferritin: 461 ng/mL — ABNORMAL HIGH (ref 11–307)

## 2021-07-21 LAB — ACETAMINOPHEN LEVEL: Acetaminophen (Tylenol), Serum: <10 ug/mL — ABNORMAL LOW (ref 10–30)

## 2021-07-21 MED ORDER — POTASSIUM CHLORIDE 10 MEQ/100ML IV SOLN
10.0000 meq | INTRAVENOUS | Status: AC
  Administered 2021-07-21 (×3): 10 meq via INTRAVENOUS
  Filled 2021-07-21 (×3): qty 100

## 2021-07-21 MED ORDER — FENTANYL CITRATE PF 50 MCG/ML IJ SOSY
100.0000 ug | PREFILLED_SYRINGE | Freq: Once | INTRAMUSCULAR | Status: AC
  Administered 2021-07-21: 100 ug via INTRAVENOUS
  Filled 2021-07-21: qty 2

## 2021-07-21 MED ORDER — GADOBUTROL 1 MMOL/ML IV SOLN
8.0000 mL | Freq: Once | INTRAVENOUS | Status: AC | PRN
  Administered 2021-07-21: 8 mL via INTRAVENOUS

## 2021-07-21 MED ORDER — ONDANSETRON HCL 4 MG/2ML IJ SOLN
4.0000 mg | Freq: Once | INTRAMUSCULAR | Status: AC
  Administered 2021-07-21: 4 mg via INTRAVENOUS
  Filled 2021-07-21: qty 2

## 2021-07-21 MED ORDER — IOHEXOL 350 MG/ML SOLN
80.0000 mL | Freq: Once | INTRAVENOUS | Status: AC | PRN
  Administered 2021-07-21: 80 mL via INTRAVENOUS

## 2021-07-21 MED ORDER — SODIUM CHLORIDE 0.9 % IV SOLN: Freq: Once | INTRAVENOUS | Status: AC

## 2021-07-21 MED ORDER — ONDANSETRON HCL 4 MG/2ML IJ SOLN
4.0000 mg | Freq: Four times a day (QID) | INTRAMUSCULAR | Status: DC | PRN
  Administered 2021-07-21 – 2021-07-23 (×2): 4 mg via INTRAVENOUS
  Filled 2021-07-21 (×3): qty 2

## 2021-07-21 MED ORDER — LISINOPRIL 10 MG PO TABS
10.0000 mg | ORAL_TABLET | Freq: Every day | ORAL | Status: DC
  Administered 2021-07-22: 10 mg via ORAL
  Filled 2021-07-21: qty 1

## 2021-07-21 MED ORDER — FENTANYL CITRATE PF 50 MCG/ML IJ SOSY
25.0000 ug | PREFILLED_SYRINGE | Freq: Once | INTRAMUSCULAR | Status: AC
  Administered 2021-07-21: 25 ug via INTRAVENOUS
  Filled 2021-07-21: qty 1

## 2021-07-21 MED ORDER — ONDANSETRON HCL 4 MG PO TABS
4.0000 mg | ORAL_TABLET | Freq: Four times a day (QID) | ORAL | Status: DC | PRN
  Administered 2021-07-23: 4 mg via ORAL

## 2021-07-21 MED ORDER — SODIUM CHLORIDE 0.9 % IV SOLN
Freq: Once | INTRAVENOUS | Status: AC
Start: 2021-07-21 — End: 2021-07-21

## 2021-07-21 MED ORDER — OXYCODONE HCL 5 MG PO TABS
5.0000 mg | ORAL_TABLET | Freq: Once | ORAL | Status: AC
  Administered 2021-07-21: 5 mg via ORAL
  Filled 2021-07-21: qty 1

## 2021-07-21 NOTE — ED Triage Notes (Signed)
Per pt use family member to translate.   Reports generalized upper abdominal pain since 1700 last night after eating dinner. Pt guarded. Attempted to force vomit with no improvement.

## 2021-07-21 NOTE — Consult Note (Addendum)
Referring Provider: ED Primary Care Physician:  Pcp, No Primary Gastroenterologist:  Althia Forts  Reason for Consultation:  Abnormal LFTs, abdominal pain  HPI: Michele Arnold is a 60 y.o. female presenting for consultation of epigastric abdominal pain and abnormal LFTs.  Patient states after eating dinner last night she started having upper abdominal pain.  She felt nauseated and bloated and felt like she needed to vomit, though she could not.  She states she then used her fingers to force herself to vomit.  Denies any hematemesis or coffee-ground emesis.  Reports that she has had upper abdominal pain intermittently after eating for the past 5 years.  No changes in stool.  No recent illness of fevers.  She denies any skin tattoos, new medications including OTC and herbal medications, or any recent alcohol use.  States she has not had alcohol in over 1 month.  Last drink was approximately 3 months ago, and she only has 1 or 2 drinks on occasion.  Takes ibuprofen as needed.  Takes Alka-Seltzer (aspirin-containing) as needed. No blood thinner use.  Denies family history of liver disease, colon cancer, or other gastrointestinal disorders or malignancy.  CT 07/21/21: Cholelithiasis with distended gallbladder. No CT evidence of acute cholecystitis. Fundal adenomyomatosis. No biliary dilatation.  [place] 07/21/21: Cholelithiasis without evidence of acute cholecystitis. Mild hepatic steatosis.  [place] (phone [phone]) utilized for this encounter, Spanish interpreter Barry Brunner [phone]  History reviewed. No pertinent past medical history.  History reviewed. No pertinent surgical history.  Prior to Admission medications   Medication Sig Start Date End Date Taking? Authorizing Provider  ibuprofen (ADVIL,MOTRIN) 800 MG tablet Take 800 mg by mouth 2 (two) times daily with a meal. 06/22/18   [provider]  predniSONE (DELTASONE) 10 MG tablet Begin with 6 tabs on day 1, 5 tab  on day 2, 4 tab on day 3, 3 tab on day 4, 2 tab on day 5, 1 tab on day 6-take with food 05/09/21   Wieters, Hallie C, PA-C  tiZANidine (ZANAFLEX) 2 MG tablet Take 1-2 tablets (2-4 mg total) by mouth every 6 (six) hours as needed for muscle spasms. 05/09/21   Wieters, Hallie C, PA-C    Scheduled Meds: Continuous Infusions: PRN Meds:.  Allergies as of 07/21/2021   (No Known Allergies)    No family history on file.  Social History   Socioeconomic History   Marital status: Single    Spouse name: Not on file   Number of children: Not on file   Years of education: Not on file   Highest education level: Not on file  Occupational History   Not on file  Tobacco Use   Smoking status: Never   Smokeless tobacco: Never  Substance and Sexual Activity   Alcohol use: Not Currently   Drug use: Not on file   Sexual activity: Not on file  Other Topics Concern   Not on file  Social History Narrative   Not on file   Social Determinants of Health   Financial Resource Strain: Not on file  Food Insecurity: Not on file  Transportation Needs: Not on file  Physical Activity: Not on file  Stress: Not on file  Social Connections: Not on file  Intimate Partner Violence: Not on file    Review of Systems: Review of Systems  Constitutional:  Negative for chills and fever.  HENT:  Negative for hearing loss and tinnitus.   Eyes:  Negative for pain and redness.  Respiratory:  Negative for cough and  shortness of breath.   Cardiovascular:  Negative for chest pain and palpitations.  Genitourinary:  Positive for hematuria. Negative for dysuria.  Skin:  Negative for itching and rash.  Neurological:  Negative for seizures and loss of consciousness.  Endo/Heme/Allergies:  Negative for polydipsia. Does not bruise/bleed easily.  Psychiatric/Behavioral:  Negative for substance abuse. The patient is not nervous/anxious.     Physical Exam: Vital signs: Vitals:   07/21/21 1100 07/21/21 1230  BP: 115/74  (!) 129/108  Pulse: 63 74  Resp: 18 18  Temp:    SpO2: 98% 99%     Physical Exam Vitals reviewed.  Constitutional:      General: She is not in acute distress. HENT:     Head: Normocephalic and atraumatic.     Nose: Nose normal. No congestion.     Mouth/Throat:     Mouth: Mucous membranes are moist.     Pharynx: Oropharynx is clear.  Eyes:     General: Scleral icterus present.     Extraocular Movements: Extraocular movements intact.  Cardiovascular:     Rate and Rhythm: Normal rate and regular rhythm.  Pulmonary:     Effort: Pulmonary effort is normal. No respiratory distress.  Abdominal:     General: Bowel sounds are normal. There is no distension.     Palpations: Abdomen is soft. There is no mass.     Tenderness: There is no abdominal tenderness. There is no guarding or rebound.  Musculoskeletal:        General: No swelling or tenderness.     Cervical back: Normal range of motion and neck supple.  Skin:    General: Skin is warm and dry.  Neurological:     General: No focal deficit present.     Mental Status: She is oriented to person, place, and time. She is lethargic.  Psychiatric:        Mood and Affect: Mood normal.        Behavior: Behavior normal. Behavior is cooperative.     GI:  Lab Results: Recent Labs    07/21/21 0532  WBC 6.1  HGB 14.3  HCT 43.2  PLT 446*   BMET Recent Labs    07/21/21 0531  NA 137  K 3.4*  CL 102  CO2 25  GLUCOSE 179*  BUN 14  CREATININE 0.61  CALCIUM 9.5   LFT Recent Labs    07/21/21 0531  PROT 8.0  ALBUMIN 4.3  AST 1,006*  ALT 530*  ALKPHOS 138*  BILITOT 3.4*   PT/INR No results for input(s): LABPROT, INR in the last 72 hours.   Studies/Results: CT ABDOMEN PELVIS W CONTRAST  Result Date: 07/21/2021 CLINICAL DATA:  Epigastric pain. EXAM: CT ABDOMEN AND PELVIS WITH CONTRAST TECHNIQUE: Multidetector CT imaging of the abdomen and pelvis was performed using the standard protocol following bolus administration  of intravenous contrast. CONTRAST:  44m OMNIPAQUE IOHEXOL 350 MG/ML SOLN COMPARISON:  None. FINDINGS: Lower chest: No acute abnormality. Subsegmental atelectasis at the lung bases. Hepatobiliary: No focal liver abnormality. Distended gallbladder with multiple gallstones. No gallbladder wall thickening or pericholecystic inflammatory change. Fundal adenomyomatosis. No biliary dilatation. Pancreas: Unremarkable. No pancreatic ductal dilatation or surrounding inflammatory changes. Spleen: Normal in size without focal abnormality. Adrenals/Urinary Tract: The adrenal glands are unremarkable. 2.2 cm simple cyst in the upper pole of the right kidney. Few punctate left renal calculi. No hydronephrosis. Bladder is decompressed. Stomach/Bowel: Stomach is within normal limits. Appendix appears normal. No evidence of bowel wall thickening,  distention, or inflammatory changes. Moderate left-sided colonic diverticulosis. Vascular/Lymphatic: Aortic atherosclerosis. No enlarged abdominal or pelvic lymph nodes. Reproductive: Uterus and bilateral adnexa are unremarkable. Other: Small fat containing umbilical hernia. No free fluid or pneumoperitoneum. Musculoskeletal: No acute or significant osseous findings. IMPRESSION: 1. Cholelithiasis with distended gallbladder. No CT evidence of acute cholecystitis. Consider further evaluation with right upper quadrant ultrasound as clinically indicated. 2. Punctate nonobstructive left nephrolithiasis. 3. Aortic Atherosclerosis (ICD10-I70.0). Electronically Signed   By: Titus Dubin M.D.   On: 07/21/2021 07:19   US Abdomen Limited RUQ (LIVER/GB)  Result Date: 07/21/2021 CLINICAL DATA:  Gallstones EXAM: ULTRASOUND ABDOMEN LIMITED RIGHT UPPER QUADRANT COMPARISON:  Same day CT abdomen/pelvis FINDINGS: Gallbladder: There are shadowing stones in the gallbladder measuring up to 1.1 cm. There is no gallbladder wall thickening or pericholecystic fluid. There was no sonographic Murphy's sign  reported. Common bile duct: Diameter: 4 mm Liver: No focal lesion identified. Parenchymal echogenicity is mildly increased. Portal vein is patent on color Doppler imaging with normal direction of blood flow towards the liver. Other: None. IMPRESSION: 1. Cholelithiasis without evidence of acute cholecystitis. 2. Mild hepatic steatosis. Electronically Signed   By: Valetta Mole M.D.   On: 07/21/2021 08:23    Impression: Abnormal LFTs, abdominal pain: Imaging showed cholelithiasis without cholecystitis, no biliary dilation. -T. Bili 3.4/ AST 1006/ ALT 530/ ALP 138 -Negative acute hepatitis panel -No leukocytosis -Platelets 446K/uL -Acetaminophen level <10 -Normal lipase  Plan: EBV, HSV, and CMV ordered to rule out infectious etiology of transaminitis.  AMA, ASMA, ANA, ceruloplasmin, ferritin/iron panel, and alpha-1 anti-trypsin ordered to rule out underlying chronic liver disease.  MRI/MRCP to rule out CBD stones, given elevated bilirubin.  NPO until post-MRCP, then clear liquids OK.  Continue supportive care. Continue to trend LFTs.  Patient also needs outpatient screening colonoscopy once acute issues have resolved.  [facility] will follow.    LOS: 0 days   [name]  PA-C 07/21/2021, 12:58 PM  Contact #  [phone]

## 2021-07-21 NOTE — H&P (Addendum)
History and Physical    Michele Arnold [id number] DOB: 09-Jul-1961 DOA: 07/21/2021  PCP: Pcp, No  Patient coming from: Home  Chief Complaint: stomach pain  HPI: Michele Arnold is a 60 y.o. female with past medical history significant for HTN. She presents with upper abdominal pain. It started yesterday. Her pain was sharp in the RU/LUQs. It came in waves. It was accompanied by nausea. She was initially unable to vomit, but she gagged herself to try to provide some relief from the bloating and pain. It did not work. Her symptoms continued through this morning, so she decided to come to the ED. She denies any other aggravating or alleviating factors.   ED Course: Her LFTs were elevated. CT ab/pelvis showed gallstones. RUQ [place] was negative for obstruction, acute chole. [facility] was consulted. [facility] was called for admission.   Review of Systems:  Denies CP, dyspnea, palpitations, diarrhea, fevers. Reports some lightheadedness, N/V, ab pain. Review of systems is otherwise negative for all not mentioned in HPI.   PMHx HTN  PSHx History reviewed. No pertinent surgical history.  SocHx  reports that she has never smoked. She has never used smokeless tobacco. She reports that she does not currently use alcohol. No history on file for drug use.  No Known Allergies  FamHx Reviewed. Non-contributory  Prior to Admission medications   Medication Sig Start Date End Date Taking? Authorizing Provider  aspirin-sod bicarb-citric acid (ALKA-SELTZER) 325 MG TBEF tablet Take 325 mg by mouth every 6 (six) hours as needed (indigestion).   Yes [provider]  bismuth subsalicylate (PEPTO BISMOL) 262 MG/15ML suspension Take 30 mLs by mouth every 6 (six) hours as needed for indigestion.   Yes [provider]  calcium carbonate (TUMS - DOSED IN MG ELEMENTAL CALCIUM) 500 MG chewable tablet Chew 2 tablets by mouth daily as needed for indigestion or heartburn.   Yes [provider]  hydrochlorothiazide (HYDRODIURIL) 12.5 MG tablet Take 12.5 mg by mouth daily.   Yes [provider]  ibuprofen (ADVIL) 200 MG tablet Take 800 mg by mouth every 8 (eight) hours as needed for mild pain. 06/22/18  Yes [provider]  lisinopril (ZESTRIL) 10 MG tablet Take 10 mg by mouth daily.   Yes [provider]  tiZANidine (ZANAFLEX) 2 MG tablet Take 1-2 tablets (2-4 mg total) by mouth every 6 (six) hours as needed for muscle spasms. Patient not taking: No sig reported 05/09/21   Janith Lima, PA-C    Physical Exam: Vitals:   07/21/21 1015 07/21/21 1100 07/21/21 1230 07/21/21 1330  BP: 122/70 115/74 (!) 129/108 139/81  Pulse: 69 63 74 65  Resp: '18 18 18 18  '$ Temp:      TempSrc:      SpO2: 99% 98% 99% 96%  Weight:      Height:        General: 60 y.o. female resting in bed in NAD Eyes: PERRL, sclera icteric ENMT: Nares patent w/o discharge, orophaynx clear, dentition normal, ears w/o discharge/lesions/ulcers Neck: Supple, trachea midline Cardiovascular: RRR, +S1, S2, no m/g/r, equal pulses throughout Respiratory: CTABL, no w/r/r, normal WOB GI: BS+, ND, RUQ/LUQ TTP, no masses noted, no organomegaly noted MSK: No e/c/c Skin: No rashes, bruises, ulcerations noted Neuro: A&O x 3, no focal deficits Psyc: Appropriate interaction and affect, calm/cooperative  Labs on Admission: I have personally reviewed following labs and imaging studies  CBC: Recent Labs  Lab 07/21/21 0532  WBC 6.1  NEUTROABS 3.4  HGB  14.3  HCT 43.2  MCV 95.6  PLT 123XX123*   Basic Metabolic Panel: Recent Labs  Lab 07/21/21 0531  NA 137  K 3.4*  CL 102  CO2 25  GLUCOSE 179*  BUN 14  CREATININE 0.61  CALCIUM 9.5   GFR: Estimated Creatinine Clearance: 74 mL/min (by C-G formula based on SCr of 0.61 mg/dL). Liver Function Tests: Recent Labs  Lab 07/21/21 0531  AST 1,006*  ALT 530*  ALKPHOS 138*  BILITOT 3.4*  PROT 8.0  ALBUMIN 4.3   Recent Labs   Lab 07/21/21 0531  LIPASE 33   No results for input(s): AMMONIA in the last 168 hours. Coagulation Profile: No results for input(s): INR, PROTIME in the last 168 hours. Cardiac Enzymes: No results for input(s): CKTOTAL, CKMB, CKMBINDEX, TROPONINI in the last 168 hours. BNP (last 3 results) No results for input(s): PROBNP in the last 8760 hours. HbA1C: No results for input(s): HGBA1C in the last 72 hours. CBG: No results for input(s): GLUCAP in the last 168 hours. Lipid Profile: No results for input(s): CHOL, HDL, LDLCALC, TRIG, CHOLHDL, LDLDIRECT in the last 72 hours. Thyroid Function Tests: No results for input(s): TSH, T4TOTAL, FREET4, T3FREE, THYROIDAB in the last 72 hours. Anemia Panel: No results for input(s): VITAMINB12, FOLATE, FERRITIN, TIBC, IRON, RETICCTPCT in the last 72 hours. Urine analysis:    Component Value Date/Time   COLORURINE AMBER (A) 07/21/2021 0531   APPEARANCEUR CLOUDY (A) 07/21/2021 0531   LABSPEC 1.025 07/21/2021 0531   PHURINE 6.0 07/21/2021 0531   GLUCOSEU NEGATIVE 07/21/2021 0531   HGBUR LARGE (A) 07/21/2021 0531   BILIRUBINUR MODERATE (A) 07/21/2021 0531   KETONESUR 5 (A) 07/21/2021 0531   PROTEINUR 30 (A) 07/21/2021 0531   NITRITE NEGATIVE 07/21/2021 0531   LEUKOCYTESUR TRACE (A) 07/21/2021 0531    Radiological Exams on Admission: CT ABDOMEN PELVIS W CONTRAST  Result Date: 07/21/2021 CLINICAL DATA:  Epigastric pain. EXAM: CT ABDOMEN AND PELVIS WITH CONTRAST TECHNIQUE: Multidetector CT imaging of the abdomen and pelvis was performed using the standard protocol following bolus administration of intravenous contrast. CONTRAST:  58m OMNIPAQUE IOHEXOL 350 MG/ML SOLN COMPARISON:  None. FINDINGS: Lower chest: No acute abnormality. Subsegmental atelectasis at the lung bases. Hepatobiliary: No focal liver abnormality. Distended gallbladder with multiple gallstones. No gallbladder wall thickening or pericholecystic inflammatory change. Fundal  adenomyomatosis. No biliary dilatation. Pancreas: Unremarkable. No pancreatic ductal dilatation or surrounding inflammatory changes. Spleen: Normal in size without focal abnormality. Adrenals/Urinary Tract: The adrenal glands are unremarkable. 2.2 cm simple cyst in the upper pole of the right kidney. Few punctate left renal calculi. No hydronephrosis. Bladder is decompressed. Stomach/Bowel: Stomach is within normal limits. Appendix appears normal. No evidence of bowel wall thickening, distention, or inflammatory changes. Moderate left-sided colonic diverticulosis. Vascular/Lymphatic: Aortic atherosclerosis. No enlarged abdominal or pelvic lymph nodes. Reproductive: Uterus and bilateral adnexa are unremarkable. Other: Small fat containing umbilical hernia. No free fluid or pneumoperitoneum. Musculoskeletal: No acute or significant osseous findings. IMPRESSION: 1. Cholelithiasis with distended gallbladder. No CT evidence of acute cholecystitis. Consider further evaluation with right upper quadrant ultrasound as clinically indicated. 2. Punctate nonobstructive left nephrolithiasis. 3. Aortic Atherosclerosis (ICD10-I70.0). Electronically Signed   By: WTitus DubinM.D.   On: 07/21/2021 07:19   [place]Abdomen Limited RUQ (LIVER/GB)  Result Date: 07/21/2021 CLINICAL DATA:  Gallstones EXAM: ULTRASOUND ABDOMEN LIMITED RIGHT UPPER QUADRANT COMPARISON:  Same day CT abdomen/pelvis FINDINGS: Gallbladder: There are shadowing stones in the gallbladder measuring up to 1.1 cm. There is no  gallbladder wall thickening or pericholecystic fluid. There was no sonographic Murphy's sign reported. Common bile duct: Diameter: 4 mm Liver: No focal lesion identified. Parenchymal echogenicity is mildly increased. Portal vein is patent on color Doppler imaging with normal direction of blood flow towards the liver. Other: None. IMPRESSION: 1. Cholelithiasis without evidence of acute cholecystitis. 2. Mild hepatic steatosis. Electronically  Signed   By: Valetta Mole M.D.   On: 07/21/2021 08:23    EKG: None obtained in ED.  Assessment/Plan Abdominal pain Elevated LFTs     - admit to inpt, tele     - [facility] onboard; appreciate assistance     - hepatitis panel is negative     - MRCP ordered     - checking APAP lvl, anti smooth muscle ab, mitochondiral ab, alpha-1-antitrypsin, ANA, iron studies, HSV, CMV, EBV, ceruloplasmin  Hypokalemia     - replace K+, check Mg2+  Hematuria     - rpt UA     - she denies any dysuria, change in urine  Hx of HTN     - she is not complaint on her medications     - resume lisinopril  Hyperglycemia     - check A1c; no history of DM  DVT prophylaxis: SCDs  Code Status: FULL  Family Communication: w/ son at bedside  Consults called: [facility]   Status is: Inpatient  Remains inpatient appropriate because:Inpatient level of care appropriate due to severity of illness  Dispo: The patient is from: Home              Anticipated d/c is to: Home              Patient currently is not medically stable to d/c.   Difficult to place patient No  Time spent coordinating admission: 70 minutes  [facility]  If 7PM-7AM, please contact night-coverage [email or web address]  07/21/2021, 3:12 PM

## 2021-07-21 NOTE — ED Provider Notes (Signed)
[facility] DEPT Provider Note: Georgena Spurling, MD, FACEP  CSN: [id number] MRN: [id number] ARRIVAL: 07/21/21 at [place] ROOM: [place]  Abdominal Pain  Patient's son used as interpreter at patient's request. HISTORY OF PRESENT ILLNESS  07/21/21 5:40 AM Michele Arnold is a 60 y.o. female who had the sudden onset of epigastric pain yesterday evening about 5 PM.  This was about an hour after eating.  The pain radiates around to the left into the right into the flanks bilaterally.  The pain is severe and worse with palpation or movement.  She tried to get relief by forcing herself to vomit but this did not help.  She has not had vomiting otherwise.  She has not had diarrhea with this.  She has had no shortness of breath and the pain is not worse with breathing.  She has not had a fever.  She does not drink alcohol.  She has never had pain like this in the past.   History reviewed. No pertinent past medical history.  History reviewed. No pertinent surgical history.  No family history on file.  Social History   Tobacco Use   Smoking status: Never   Smokeless tobacco: Never  Substance Use Topics   Alcohol use: Not Currently    Prior to Admission medications   Medication Sig Start Date End Date Taking? Authorizing Provider  ibuprofen (ADVIL,MOTRIN) 800 MG tablet Take 800 mg by mouth 2 (two) times daily with a meal. 06/22/18   [provider]  predniSONE (DELTASONE) 10 MG tablet Begin with 6 tabs on day 1, 5 tab on day 2, 4 tab on day 3, 3 tab on day 4, 2 tab on day 5, 1 tab on day 6-take with food 05/09/21   Wieters, Hallie C, PA-C  tiZANidine (ZANAFLEX) 2 MG tablet Take 1-2 tablets (2-4 mg total) by mouth every 6 (six) hours as needed for muscle spasms. 05/09/21   Wieters, Hallie C, PA-C    Allergies Patient has no known allergies.   REVIEW OF SYSTEMS  Negative except as noted here or in the History of Present Illness.   PHYSICAL EXAMINATION   Initial Vital Signs Blood pressure 120/66, pulse 88, temperature 97.6 F (36.4 C), temperature source Oral, resp. rate 16, height '5\' 1"'$  (1.549 m), weight 79.4 kg, SpO2 100 %.  Examination General: Well-developed, well-nourished female in no acute distress; appearance consistent with age of record HENT: normocephalic; atraumatic Eyes: pupils equal, round and reactive to light; extraocular muscles intact Neck: supple Heart: regular rate and rhythm Lungs: clear to auscultation bilaterally Abdomen: soft; nondistended; epigastric tenderness with lesser right upper quadrant and left upper quadrant tenderness; bowel sounds present Extremities: No deformity; full range of motion; pulses normal Neurologic: Awake, alert; motor function intact in all extremities and symmetric; no facial droop Skin: Warm and dry Psychiatric: Normal mood and affect   RESULTS  Summary of this visit's results, reviewed and interpreted by myself:   EKG Interpretation  Date/Time:    Ventricular Rate:    PR Interval:    QRS Duration:   QT Interval:    QTC Calculation:   R Axis:     Text Interpretation:         Laboratory Studies: Results for orders placed or performed during the hospital encounter of 07/21/21 (from the past 24 hour(s))  Lipase, blood     Status: None   Collection Time: 07/21/21  5:31 AM  Result Value Ref Range   Lipase 33  11 - 51 U/L  Comprehensive metabolic panel     Status: Abnormal   Collection Time: 07/21/21  5:31 AM  Result Value Ref Range   Sodium 137 135 - 145 mmol/L   Potassium 3.4 (L) 3.5 - 5.1 mmol/L   Chloride 102 98 - 111 mmol/L   CO2 25 22 - 32 mmol/L   Glucose, Bld 179 (H) 70 - 99 mg/dL   BUN 14 6 - 20 mg/dL   Creatinine, Ser 0.61 0.44 - 1.00 mg/dL   Calcium 9.5 8.9 - 10.3 mg/dL   Total Protein 8.0 6.5 - 8.1 g/dL   Albumin 4.3 3.5 - 5.0 g/dL   AST 1,006 (H) 15 - 41 U/L   ALT 530 (H) 0 - 44 U/L   Alkaline Phosphatase 138 (H) 38 - 126 U/L   Total Bilirubin 3.4 (H)  0.3 - 1.2 mg/dL   GFR, Estimated >60 >60 mL/min   Anion gap 10 5 - 15  Urinalysis, Routine w reflex microscopic     Status: Abnormal   Collection Time: 07/21/21  5:31 AM  Result Value Ref Range   Color, Urine AMBER (A) YELLOW   APPearance CLOUDY (A) CLEAR   Specific Gravity, Urine 1.025 1.005 - 1.030   pH 6.0 5.0 - 8.0   Glucose, UA NEGATIVE NEGATIVE mg/dL   Hgb urine dipstick LARGE (A) NEGATIVE   Bilirubin Urine MODERATE (A) NEGATIVE   Ketones, ur 5 (A) NEGATIVE mg/dL   Protein, ur 30 (A) NEGATIVE mg/dL   Nitrite NEGATIVE NEGATIVE   Leukocytes,Ua TRACE (A) NEGATIVE   RBC / HPF >50 (H) 0 - 5 RBC/hpf   WBC, UA 11-20 0 - 5 WBC/hpf   Bacteria, UA FEW (A) NONE SEEN   Squamous Epithelial / LPF 21-50 0 - 5   Mucus PRESENT   CBC with Differential     Status: Abnormal   Collection Time: 07/21/21  5:32 AM  Result Value Ref Range   WBC 6.1 4.0 - 10.5 K/uL   RBC 4.52 3.87 - 5.11 MIL/uL   Hemoglobin 14.3 12.0 - 15.0 g/dL   HCT 43.2 36.0 - 46.0 %   MCV 95.6 80.0 - 100.0 fL   MCH 31.6 26.0 - 34.0 pg   MCHC 33.1 30.0 - 36.0 g/dL   RDW 12.6 11.5 - 15.5 %   Platelets 446 (H) 150 - 400 K/uL   nRBC 0.0 0.0 - 0.2 %   Neutrophils Relative % 56 %   Neutro Abs 3.4 1.7 - 7.7 K/uL   Lymphocytes Relative 34 %   Lymphs Abs 2.1 0.7 - 4.0 K/uL   Monocytes Relative 9 %   Monocytes Absolute 0.6 0.1 - 1.0 K/uL   Eosinophils Relative 1 %   Eosinophils Absolute 0.1 0.0 - 0.5 K/uL   Basophils Relative 0 %   Basophils Absolute 0.0 0.0 - 0.1 K/uL   Immature Granulocytes 0 %   Abs Immature Granulocytes 0.01 0.00 - 0.07 K/uL  Resp Panel by RT-PCR (Flu A&B, Covid) Nasopharyngeal Swab     Status: None   Collection Time: 07/21/21  6:36 AM   Specimen: Nasopharyngeal Swab; Nasopharyngeal(NP) swabs in vial transport medium  Result Value Ref Range   SARS Coronavirus 2 by RT PCR NEGATIVE NEGATIVE   Influenza A by PCR NEGATIVE NEGATIVE   Influenza B by PCR NEGATIVE NEGATIVE  Acetaminophen level     Status:  Abnormal   Collection Time: 07/21/21  7:44 AM  Result Value Ref Range   Acetaminophen (  Tylenol), Serum <10 (L) 10 - 30 ug/mL   Imaging Studies: CT ABDOMEN PELVIS W CONTRAST  Result Date: 07/21/2021 CLINICAL DATA:  Epigastric pain. EXAM: CT ABDOMEN AND PELVIS WITH CONTRAST TECHNIQUE: Multidetector CT imaging of the abdomen and pelvis was performed using the standard protocol following bolus administration of intravenous contrast. CONTRAST:  75m OMNIPAQUE IOHEXOL 350 MG/ML SOLN COMPARISON:  None. FINDINGS: Lower chest: No acute abnormality. Subsegmental atelectasis at the lung bases. Hepatobiliary: No focal liver abnormality. Distended gallbladder with multiple gallstones. No gallbladder wall thickening or pericholecystic inflammatory change. Fundal adenomyomatosis. No biliary dilatation. Pancreas: Unremarkable. No pancreatic ductal dilatation or surrounding inflammatory changes. Spleen: Normal in size without focal abnormality. Adrenals/Urinary Tract: The adrenal glands are unremarkable. 2.2 cm simple cyst in the upper pole of the right kidney. Few punctate left renal calculi. No hydronephrosis. Bladder is decompressed. Stomach/Bowel: Stomach is within normal limits. Appendix appears normal. No evidence of bowel wall thickening, distention, or inflammatory changes. Moderate left-sided colonic diverticulosis. Vascular/Lymphatic: Aortic atherosclerosis. No enlarged abdominal or pelvic lymph nodes. Reproductive: Uterus and bilateral adnexa are unremarkable. Other: Small fat containing umbilical hernia. No free fluid or pneumoperitoneum. Musculoskeletal: No acute or significant osseous findings. IMPRESSION: 1. Cholelithiasis with distended gallbladder. No CT evidence of acute cholecystitis. Consider further evaluation with right upper quadrant ultrasound as clinically indicated. 2. Punctate nonobstructive left nephrolithiasis. 3. Aortic Atherosclerosis (ICD10-I70.0). Electronically Signed   By: WTitus Dubin M.D.   On: 07/21/2021 07:19   [place]domen Limited RUQ (LIVER/GB)  Result Date: 07/21/2021 CLINICAL DATA:  Gallstones EXAM: ULTRASOUND ABDOMEN LIMITED RIGHT UPPER QUADRANT COMPARISON:  Same day CT abdomen/pelvis FINDINGS: Gallbladder: There are shadowing stones in the gallbladder measuring up to 1.1 cm. There is no gallbladder wall thickening or pericholecystic fluid. There was no sonographic Murphy's sign reported. Common bile duct: Diameter: 4 mm Liver: No focal lesion identified. Parenchymal echogenicity is mildly increased. Portal vein is patent on color Doppler imaging with normal direction of blood flow towards the liver. Other: None. IMPRESSION: 1. Cholelithiasis without evidence of acute cholecystitis. 2. Mild hepatic steatosis. Electronically Signed   By: PValetta MoleM.D.   On: 07/21/2021 08:23    ED COURSE and MDM  Nursing notes, initial and subsequent vitals signs, including pulse oximetry, reviewed and interpreted by myself.  Vitals:   07/21/21 0528 07/21/21 0530 07/21/21 0615 07/21/21 0630  BP: 120/66  132/66 119/65  Pulse: 88  (!) 57 62  Resp: 16  (!) 23 16  Temp: 97.6 F (36.4 C)     TempSrc: Oral     SpO2: 100%  96% 98%  Weight:  79.4 kg    Height:  '5\' 1"'$  (1.549 m)     Medications  ondansetron (ZOFRAN) injection 4 mg (4 mg Intravenous Given 07/21/21 0604)  fentaNYL (SUBLIMAZE) injection 100 mcg (100 mcg Intravenous Given 07/21/21 0559)  0.9 %  sodium chloride infusion ( Intravenous New Bag/Given 07/21/21 0604)  iohexol (OMNIPAQUE) 350 MG/ML injection 80 mL (80 mLs Intravenous Contrast Given 07/21/21 0646)   7:24 AM Given equivocal results on CT scan we will obtain a right upper quadrant ultrasound.  I suspect an impacted stone in the gallbladder outlet. Signed out to Dr. DDoren Custard   PROCEDURES  Procedures   ED DIAGNOSES     ICD-10-CM   1. Biliary colic  [id number]    2. Gallstones  K80.20 [place]oreaAbdomen Limited RUQ (LIVER/GB)    [place]domen Limited RUQ (LIVER/GB)    3. Gross  hematuria  R31.0     4. Elevated LFTs  R79.89          Trevonn Hallum, Jenny Reichmann, MD 07/21/21 [phone]

## 2021-07-21 NOTE — ED Provider Notes (Signed)
60 year old female presenting for epigastric pain since yesterday evening.  Work-up notable for elevation in bilirubin, transaminases.  CT scan shows distended gallbladder with presence of stones.  Currently waiting on right upper quadrant ultrasound.  Consult surgery as needed.  She also has unexplained hematuria. Physical Exam  BP 119/65   Pulse 62   Temp 97.6 F (36.4 C) (Oral)   Resp 16   Ht '5\' 1"'$  (1.549 m)   Wt 79.4 kg   SpO2 98%   BMI 33.07 kg/m   Physical Exam Constitutional:      General: She is not in acute distress.    Appearance: She is not ill-appearing, toxic-appearing or diaphoretic.  HENT:     Head: Normocephalic and atraumatic.  Abdominal:     Palpations: Abdomen is soft.  Neurological:     General: No focal deficit present.     Mental Status: She is alert and oriented to person, place, and time.  Psychiatric:        Mood and Affect: Mood normal.        Behavior: Behavior normal.    ED Course/Procedures     Procedures  MDM  Similar to CT scan, ultrasound showed cholelithiasis without evidence of cholecystitis.  Liver echotexture was consistent with mild hepatic steatosis.  On assessment, patient is well-appearing.  She does endorse continued right upper quadrant abdominal pain.  Repeat dose of fentanyl was ordered.  She denies any current nausea.  On review of lab work, patient's presentation is consistent with a hepatitis of unknown etiology.  Hepatobiliary enzymes consistent with a hepatocellular process.  Hepatitis panel and Tylenol level were ordered.  Both were unremarkable.  When speaking with the patient, she denies any history of known liver disease.  She states that she does not drink alcohol.  She has not been utilizing any new nutritional supplements.  She states that her onset of symptoms was postprandial and occurred yesterday evening.  She states that she has had similar right upper quadrant/epigastric pain in the past following spicy meals.  Patient  does not have a primary care doctor.  Limited history is available in EMR.  No prior lab work is available.  I spoke with GI regarding the patient's unexplained hepatitis.  On-call GI provider did come in evaluate the patient in the ED.  Recommendations included further laboratory analysis, MRCP, and trending of hepatobiliary enzymes.  Patient was admitted to hospitalist for ongoing care.       Godfrey Pick, MD 07/21/21 1728

## 2021-07-21 NOTE — Plan of Care (Signed)
  Problem: Health Behavior/Discharge Planning: Goal: Ability to manage health-related needs will improve Outcome: Progressing   Problem: Clinical Measurements: Goal: Will remain free from infection Outcome: Progressing Goal: Respiratory complications will improve Outcome: Progressing Goal: Cardiovascular complication will be avoided Outcome: Progressing   Problem: Activity: Goal: Risk for activity intolerance will decrease Outcome: Progressing

## 2021-07-22 ENCOUNTER — Inpatient Hospital Stay (HOSPITAL_COMMUNITY): Payer: Self-pay

## 2021-07-22 DIAGNOSIS — R7989 Other specified abnormal findings of blood chemistry: Secondary | ICD-10-CM

## 2021-07-22 DIAGNOSIS — R310 Gross hematuria: Secondary | ICD-10-CM

## 2021-07-22 LAB — CBC
HCT: 38.3 % (ref 36.0–46.0)
Hemoglobin: 12.8 g/dL (ref 12.0–15.0)
MCH: 32.2 pg (ref 26.0–34.0)
MCHC: 33.4 g/dL (ref 30.0–36.0)
MCV: 96.2 fL (ref 80.0–100.0)
Platelets: 355 10*3/uL (ref 150–400)
RBC: 3.98 MIL/uL (ref 3.87–5.11)
RDW: 13 % (ref 11.5–15.5)
WBC: 5.9 10*3/uL (ref 4.0–10.5)
nRBC: 0 % (ref 0.0–0.2)

## 2021-07-22 LAB — COMPREHENSIVE METABOLIC PANEL
ALT: 423 U/L — ABNORMAL HIGH (ref 0–44)
AST: 336 U/L — ABNORMAL HIGH (ref 15–41)
Albumin: 3.7 g/dL (ref 3.5–5.0)
Alkaline Phosphatase: 155 U/L — ABNORMAL HIGH (ref 38–126)
Anion gap: 5 (ref 5–15)
BUN: 11 mg/dL (ref 6–20)
CO2: 23 mmol/L (ref 22–32)
Calcium: 9 mg/dL (ref 8.9–10.3)
Chloride: 114 mmol/L — ABNORMAL HIGH (ref 98–111)
Creatinine, Ser: 0.74 mg/dL (ref 0.44–1.00)
GFR, Estimated: >60 mL/min (ref >60)
Glucose, Bld: 98 mg/dL (ref 70–99)
Potassium: 3.8 mmol/L (ref 3.5–5.1)
Sodium: 142 mmol/L (ref 135–145)
Total Bilirubin: 3.5 mg/dL — ABNORMAL HIGH (ref 0.3–1.2)
Total Protein: 7.1 g/dL (ref 6.5–8.1)

## 2021-07-22 LAB — CMV IGM: CMV IgM: <30 AU/mL (ref 0.0–29.9)

## 2021-07-22 LAB — HSV(HERPES SIMPLEX VRS) I + II AB-IGM: HSVI/II Comb IgM: <0.91 Ratio (ref 0.00–0.90)

## 2021-07-22 LAB — ALPHA-1-ANTITRYPSIN: A-1 Antitrypsin, Ser: 139 mg/dL (ref 101–187)

## 2021-07-22 LAB — PROTIME-INR
INR: 0.9 (ref 0.8–1.2)
Prothrombin Time: 12.4 seconds (ref 11.4–15.2)

## 2021-07-22 LAB — EPSTEIN-BARR VIRUS VCA, IGM: EBV VCA IgM: <36 U/mL (ref 0.0–35.9)

## 2021-07-22 LAB — MITOCHONDRIAL ANTIBODIES: Mitochondrial M2 Ab, IgG: <20 Units (ref 0.0–20.0)

## 2021-07-22 LAB — ANTI-SMOOTH MUSCLE ANTIBODY, IGG: F-Actin IgG: 14 Units (ref 0–19)

## 2021-07-22 LAB — HIV ANTIBODY (ROUTINE TESTING W REFLEX): HIV Screen 4th Generation wRfx: NONREACTIVE

## 2021-07-22 LAB — CERULOPLASMIN: Ceruloplasmin: 26.8 mg/dL (ref 19.0–39.0)

## 2021-07-22 MED ORDER — MORPHINE SULFATE (PF) 4 MG/ML IV SOLN: 3.0000 mg | Freq: Once | INTRAVENOUS | Status: AC

## 2021-07-22 MED ORDER — TRAMADOL HCL 50 MG PO TABS: 50.0000 mg | ORAL_TABLET | Freq: Four times a day (QID) | ORAL | Status: DC | PRN

## 2021-07-22 MED ORDER — MORPHINE SULFATE (PF) 4 MG/ML IV SOLN
INTRAVENOUS | Status: AC
  Administered 2021-07-22: 3 mg via INTRAVENOUS
  Filled 2021-07-22: qty 1

## 2021-07-22 MED ORDER — MORPHINE SULFATE (PF) 2 MG/ML IV SOLN: 2.0000 mg | INTRAVENOUS | Status: DC | PRN

## 2021-07-22 MED ORDER — SODIUM CHLORIDE 0.9 % IV SOLN
2.0000 g | INTRAVENOUS | Status: DC
  Administered 2021-07-22 – 2021-07-23 (×2): 2 g via INTRAVENOUS
  Filled 2021-07-22 (×3): qty 20

## 2021-07-22 MED ORDER — ACETAMINOPHEN 325 MG PO TABS: 650.0000 mg | ORAL_TABLET | Freq: Four times a day (QID) | ORAL | Status: DC | PRN

## 2021-07-22 MED ORDER — MORPHINE BOLUS VIA INFUSION
3.0000 mg | Freq: Once | INTRAVENOUS | Status: DC
Start: 2021-07-22 — End: 2021-07-22

## 2021-07-22 MED ORDER — TECHNETIUM TC 99M MEBROFENIN IV KIT
7.6000 | PACK | Freq: Once | INTRAVENOUS | Status: AC
  Administered 2021-07-22: 7.6 via INTRAVENOUS

## 2021-07-22 NOTE — Consult Note (Signed)
Consult Note  Michele Arnold Dec 12, 1960  [id number].    Requesting MD: Dr. Eulogio Bear Chief Complaint/Reason for Consult: Cholecystitis  HPI:  Patient is a 60 year old female who presented to [facility] 9/6 with abdominal pain since around 5PM 9/5. Patient reports abdominal pain was sudden onset in epigastrium  with radiation across upper abdomen and to right back. Pain was severe and she tried to relieve it by forcing herself to vomit but this did not help. She reports darkened urine as well. She denies fever, chills, chest pain, SOB, diarrhea. She reports currently the pain in epigastrium is some better but pain in right flank is worse. She denies nausea or vomiting today. She otherwise has PMH significant for HTN, and obesity. She has had a cesarean section previously. NKDA and no blood thinning medications. 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 Her husband and son were at bedside.   Patient is spanish speaking and video interpreter was used for history and physical exam.   ROS: Review of Systems  Constitutional:  Negative for chills and fever.  Respiratory:  Negative for shortness of breath and wheezing.   Cardiovascular:  Negative for chest pain and palpitations.  Gastrointestinal:  Positive for abdominal pain, nausea and vomiting. Negative for blood in stool, constipation and diarrhea.  Genitourinary:  Positive for hematuria.  Musculoskeletal:  Positive for back pain.  All other systems reviewed and are negative.  No family history on file.  History reviewed. No pertinent past medical history.  History reviewed. No pertinent surgical history.  Social History:  reports that she has never smoked. She has never used smokeless tobacco. She reports that she does not currently use alcohol. No history on file for drug use.  Allergies: No Known Allergies  Medications Prior to Admission  Medication Sig Dispense Refill   aspirin-sod bicarb-citric  acid (ALKA-SELTZER) 325 MG TBEF tablet Take 325 mg by mouth every 6 (six) hours as needed (indigestion).     bismuth subsalicylate (PEPTO BISMOL) 262 MG/15ML suspension Take 30 mLs by mouth every 6 (six) hours as needed for indigestion.     calcium carbonate (TUMS - DOSED IN MG ELEMENTAL CALCIUM) 500 MG chewable tablet Chew 2 tablets by mouth daily as needed for indigestion or heartburn.     hydrochlorothiazide (HYDRODIURIL) 12.5 MG tablet Take 12.5 mg by mouth daily.     ibuprofen (ADVIL) 200 MG tablet Take 800 mg by mouth every 8 (eight) hours as needed for mild pain.  2   lisinopril (ZESTRIL) 10 MG tablet Take 10 mg by mouth daily.     tiZANidine (ZANAFLEX) 2 MG tablet Take 1-2 tablets (2-4 mg total) by mouth every 6 (six) hours as needed for muscle spasms. (Patient not taking: No sig reported) 30 tablet 0    Blood pressure (!) 159/66, pulse 68, temperature 97.9 F (36.6 C), temperature source Oral, resp. rate 18, height '5\' 1"'$  (1.549 m), weight 76.8 kg, SpO2 96 %. Physical Exam:  General: pleasant, WD, obese female who is laying in bed in NAD HEENT: head is normocephalic, atraumatic.  Sclera are anicteric. Ears and nose without any masses or lesions.  Mouth is pink and moist Heart: regular, rate, and rhythm.  Normal s1,s2. No obvious murmurs, gallops, or rubs noted.  Palpable radial and pedal pulses bilaterally Lungs: CTAB, no wheezes, rhonchi, or rales noted.  Respiratory effort nonlabored Abd: soft, ttp in RUQ with positive murphy sign, ND, +BS, no masses, hernias, or  organomegaly MS: all 4 extremities are symmetrical with no cyanosis, clubbing, or edema. Skin: warm and dry with no masses, lesions, or rashes Neuro: Cranial nerves 2-12 grossly intact, sensation is normal throughout Psych: A&Ox3 with an appropriate affect.   Results for orders placed or performed during the hospital encounter of 07/21/21 (from the past 48 hour(s))  Lipase, blood     Status: None   Collection Time:  07/21/21  5:31 AM  Result Value Ref Range   Lipase 33 11 - 51 U/L    Comment: Performed at [facility], [place] [address], [place], [place] [address]  Comprehensive metabolic panel     Status: Abnormal   Collection Time: 07/21/21  5:31 AM  Result Value Ref Range   Sodium 137 135 - 145 mmol/L   Potassium 3.4 (L) 3.5 - 5.1 mmol/L   Chloride 102 98 - 111 mmol/L   CO2 25 22 - 32 mmol/L   Glucose, Bld 179 (H) 70 - 99 mg/dL    Comment: Glucose reference range applies only to samples taken after fasting for at least 8 hours.   BUN 14 6 - 20 mg/dL   Creatinine, Ser 0.61 0.44 - 1.00 mg/dL   Calcium 9.5 8.9 - 10.3 mg/dL   Total Protein 8.0 6.5 - 8.1 g/dL   Albumin 4.3 3.5 - 5.0 g/dL   AST 1,006 (H) 15 - 41 U/L   ALT 530 (H) 0 - 44 U/L   Alkaline Phosphatase 138 (H) 38 - 126 U/L   Total Bilirubin 3.4 (H) 0.3 - 1.2 mg/dL   GFR, Estimated >60 >60 mL/min    Comment: (NOTE) Calculated using the CKD-EPI Creatinine Equation (2021)    Anion gap 10 5 - 15    Comment: Performed at [facility], [place] [address] [place], [place] [address]  Urinalysis, Routine w reflex microscopic     Status: Abnormal   Collection Time: 07/21/21  5:31 AM  Result Value Ref Range   Color, Urine AMBER (A) YELLOW    Comment: BIOCHEMICALS MAY BE AFFECTED BY COLOR   APPearance CLOUDY (A) CLEAR   Specific Gravity, Urine 1.025 1.005 - 1.030   pH 6.0 5.0 - 8.0   Glucose, UA NEGATIVE NEGATIVE mg/dL   Hgb urine dipstick LARGE (A) NEGATIVE   Bilirubin Urine MODERATE (A) NEGATIVE   Ketones, ur 5 (A) NEGATIVE mg/dL   Protein, ur 30 (A) NEGATIVE mg/dL   Nitrite NEGATIVE NEGATIVE   Leukocytes,Ua TRACE (A) NEGATIVE   RBC / HPF >50 (H) 0 - 5 RBC/hpf   WBC, UA 11-20 0 - 5 WBC/hpf   Bacteria, UA FEW (A) NONE SEEN   Squamous Epithelial / LPF 21-50 0 - 5   Mucus PRESENT     Comment: Performed at [facility], [place] [address], [place], [place] [address]  CBC with Differential      Status: Abnormal   Collection Time: 07/21/21  5:32 AM  Result Value Ref Range   WBC 6.1 4.0 - 10.5 K/uL   RBC 4.52 3.87 - 5.11 MIL/uL   Hemoglobin 14.3 12.0 - 15.0 g/dL   HCT 43.2 36.0 - 46.0 %   MCV 95.6 80.0 - 100.0 fL   MCH 31.6 26.0 - 34.0 pg   MCHC 33.1 30.0 - 36.0 g/dL   RDW 12.6 11.5 - 15.5 %   Platelets 446 (H) 150 - 400 K/uL   nRBC 0.0 0.0 - 0.2 %   Neutrophils Relative % 56 %  Neutro Abs 3.4 1.7 - 7.7 K/uL   Lymphocytes Relative 34 %   Lymphs Abs 2.1 0.7 - 4.0 K/uL   Monocytes Relative 9 %   Monocytes Absolute 0.6 0.1 - 1.0 K/uL   Eosinophils Relative 1 %   Eosinophils Absolute 0.1 0.0 - 0.5 K/uL   Basophils Relative 0 %   Basophils Absolute 0.0 0.0 - 0.1 K/uL   Immature Granulocytes 0 %   Abs Immature Granulocytes 0.01 0.00 - 0.07 K/uL    Comment: Performed at [facility], [place] [address], [place], [place] [address]  Resp Panel by RT-PCR (Flu A&B, Covid) Nasopharyngeal Swab     Status: None   Collection Time: 07/21/21  6:36 AM   Specimen: Nasopharyngeal Swab; Nasopharyngeal(NP) swabs in vial transport medium  Result Value Ref Range   SARS Coronavirus 2 by RT PCR NEGATIVE NEGATIVE    Comment: (NOTE) SARS-CoV-2 target nucleic acids are NOT DETECTED.  The SARS-CoV-2 RNA is generally detectable in upper respiratory specimens during the acute phase of infection. The lowest concentration of SARS-CoV-2 viral copies this assay can detect is 138 copies/mL. A negative result does not preclude SARS-Cov-2 infection and should not be used as the sole basis for treatment or other patient management decisions. A negative result may occur with  improper specimen collection/handling, submission of specimen other than nasopharyngeal swab, presence of viral mutation(s) within the areas targeted by this assay, and inadequate number of viral copies(<138 copies/mL). A negative result must be combined with clinical observations, patient history, and  epidemiological information. The expected result is Negative.  Fact Sheet for Patients:  EntrepreneurPulse.com.au  Fact Sheet for Healthcare Providers:  IncredibleEmployment.be  This test is no t yet approved or cleared by the [place] FDA and  has been authorized for detection and/or diagnosis of SARS-CoV-2 by FDA under an Emergency Use Authorization (EUA). This EUA will remain  in effect (meaning this test can be used) for the duration of the COVID-19 declaration under Section 564(b)(1) of the Act, 21 U.S.C.section 360bbb-3(b)(1), unless the authorization is terminated  or revoked sooner.       Influenza A by PCR NEGATIVE NEGATIVE   Influenza B by PCR NEGATIVE NEGATIVE    Comment: (NOTE) The Xpert Xpress SARS-CoV-2/FLU/RSV plus assay is intended as an aid in the diagnosis of influenza from Nasopharyngeal swab specimens and should not be used as a sole basis for treatment. Nasal washings and aspirates are unacceptable for Xpert Xpress SARS-CoV-2/FLU/RSV testing.  Fact Sheet for Patients: EntrepreneurPulse.com.au  Fact Sheet for Healthcare Providers: IncredibleEmployment.be  This test is not yet approved or cleared by the [place] FDA and has been authorized for detection and/or diagnosis of SARS-CoV-2 by FDA under an Emergency Use Authorization (EUA). This EUA will remain in effect (meaning this test can be used) for the duration of the COVID-19 declaration under Section 564(b)(1) of the Act, 21 U.S.C. section 360bbb-3(b)(1), unless the authorization is terminated or revoked.  Performed at [facility], [place] [address] [place], [place] [address]   Hepatitis panel, acute     Status: None   Collection Time: 07/21/21  7:44 AM  Result Value Ref Range   Hepatitis B Surface Ag NON REACTIVE NON REACTIVE   HCV Ab NON REACTIVE NON REACTIVE    Comment: (NOTE) Nonreactive HCV  antibody screen is consistent with no HCV infections,  unless recent infection is suspected or other evidence exists to indicate HCV infection.     Hep A IgM  NON REACTIVE NON REACTIVE   Hep B C IgM NON REACTIVE NON REACTIVE    Comment: Performed at [facility], [place] [address], [place], [place] [address]  Acetaminophen level     Status: Abnormal   Collection Time: 07/21/21  7:44 AM  Result Value Ref Range   Acetaminophen (Tylenol), Serum <10 (L) 10 - 30 ug/mL    Comment: (NOTE) Therapeutic concentrations vary significantly. A range of 10-30 ug/mL  may be an effective concentration for many patients. However, some  are best treated at concentrations outside of this range. Acetaminophen concentrations >150 ug/mL at 4 hours after ingestion  and >50 ug/mL at 12 hours after ingestion are often associated with  toxic reactions.  Performed at [facility], [place] [address] [place], [place] [address]   Ferritin     Status: Abnormal   Collection Time: 07/21/21  1:55 PM  Result Value Ref Range   Ferritin 461 (H) 11 - 307 ng/mL    Comment: Performed at [facility], [place] [address] [place], [place] [address]  Iron and TIBC     Status: Abnormal   Collection Time: 07/21/21  1:55 PM  Result Value Ref Range   Iron 197 (H) 28 - 170 ug/dL   TIBC 378 250 - 450 ug/dL   Saturation Ratios 52 (H) 10.4 - 31.8 %   UIBC 181 ug/dL    Comment: Performed at [facility], [place] [address], [place], [place] [address]  CMV IgM     Status: None   Collection Time: 07/21/21  1:55 PM  Result Value Ref Range   CMV IgM <30.0 0.0 - 29.9 AU/mL    Comment: (NOTE)                                Negative         <30.0                                Equivocal  30.0 - 34.9                                Positive         >34.9 A positive result is generally indicative of acute infection, reactivation or persistent IgM production. Performed At: [facility] [place], [place] [id number] Rush Farmer MD [id number]   HIV Antibody (routine testing w rflx)     Status: None   Collection Time: 07/21/21  5:23 PM  Result Value Ref Range   HIV Screen 4th Generation wRfx Non Reactive Non Reactive    Comment: Performed at [facility], [place] [address] [place], [place] [address]  Magnesium     Status: None   Collection Time: 07/21/21  5:23 PM  Result Value Ref Range   Magnesium 2.3 1.7 - 2.4 mg/dL    Comment: Performed at [facility], [place] [address], [place], [place] [address]  Hemoglobin A1c     Status: None   Collection Time: 07/21/21  5:23 PM  Result Value Ref Range   Hgb A1c MFr Bld 5.6 4.8 - 5.6 %    Comment: (NOTE) Pre diabetes:          5.7%-6.4%  Diabetes:              >  6.4%  Glycemic control for   <7.0% adults with diabetes    Mean Plasma Glucose 114.02 mg/dL    Comment: Performed at [place] [address], [place], [place] [address]  Comprehensive metabolic panel     Status: Abnormal   Collection Time: 07/22/21  5:11 AM  Result Value Ref Range   Sodium 142 135 - 145 mmol/L   Potassium 3.8 3.5 - 5.1 mmol/L   Chloride 114 (H) 98 - 111 mmol/L   CO2 23 22 - 32 mmol/L   Glucose, Bld 98 70 - 99 mg/dL    Comment: Glucose reference range applies only to samples taken after fasting for at least 8 hours.   BUN 11 6 - 20 mg/dL   Creatinine, Ser 0.74 0.44 - 1.00 mg/dL   Calcium 9.0 8.9 - 10.3 mg/dL   Total Protein 7.1 6.5 - 8.1 g/dL   Albumin 3.7 3.5 - 5.0 g/dL   AST 336 (H) 15 - 41 U/L   ALT 423 (H) 0 - 44 U/L   Alkaline Phosphatase 155 (H) 38 - 126 U/L   Total Bilirubin 3.5 (H) 0.3 - 1.2 mg/dL   GFR, Estimated >60 >60 mL/min    Comment: (NOTE) Calculated using the CKD-EPI Creatinine Equation (2021)    Anion gap 5 5 - 15    Comment: Performed at [facility], [place] [address] [place], [place] [address]  CBC     Status: None   Collection Time:  07/22/21  5:11 AM  Result Value Ref Range   WBC 5.9 4.0 - 10.5 K/uL   RBC 3.98 3.87 - 5.11 MIL/uL   Hemoglobin 12.8 12.0 - 15.0 g/dL   HCT 38.3 36.0 - 46.0 %   MCV 96.2 80.0 - 100.0 fL   MCH 32.2 26.0 - 34.0 pg   MCHC 33.4 30.0 - 36.0 g/dL   RDW 13.0 11.5 - 15.5 %   Platelets 355 150 - 400 K/uL   nRBC 0.0 0.0 - 0.2 %    Comment: Performed at [facility], [place] [address] [place], [place] [address]  Protime-INR     Status: None   Collection Time: 07/22/21  5:11 AM  Result Value Ref Range   Prothrombin Time 12.4 11.4 - 15.2 seconds   INR 0.9 0.8 - 1.2    Comment: (NOTE) INR goal varies based on device and disease states. Performed at [facility], [place] [address], [place], [place] [address]    NM Hepatobiliary Liver Func  Result Date: 07/22/2021 CLINICAL DATA:  Epigastric pain, abnormal imaging of the gallbladder EXAM: NUCLEAR MEDICINE HEPATOBILIARY IMAGING TECHNIQUE: Sequential images of the abdomen were obtained out to 60 minutes following intravenous administration of radiopharmaceutical. Additional 30 minutes of scintigraphic imaging was obtained post administration of 3 mg of morphine. RADIOPHARMACEUTICALS:  7.6 mCi Tc-78m Choletec IV COMPARISON:  MRI, CT and ultrasound July 21, 2021 FINDINGS: Prompt uptake and biliary excretion of activity by the liver is seen. Gallbladder activity is not visualized before or after the administration of morphine. Additionally, there is a subtle rim of increased radiotracer activity in the hepatic parenchyma along the gallbladder fossa. Biliary activity passes into small bowel, consistent with patent common bile duct. IMPRESSION: Scintigraphic findings most consistent with acute cholecystitis. These results will be called to the ordering clinician or representative by the Radiologist Assistant, and communication documented in the PACS or [employer] Electronically Signed   By: JAndree MoroD.  On:  07/22/2021 15:22   CT ABDOMEN PELVIS W CONTRAST  Result Date: 07/21/2021 CLINICAL DATA:  Epigastric pain. EXAM: CT ABDOMEN AND PELVIS WITH CONTRAST TECHNIQUE: Multidetector CT imaging of the abdomen and pelvis was performed using the standard protocol following bolus administration of intravenous contrast. CONTRAST:  53m OMNIPAQUE IOHEXOL 350 MG/ML SOLN COMPARISON:  None. FINDINGS: Lower chest: No acute abnormality. Subsegmental atelectasis at the lung bases. Hepatobiliary: No focal liver abnormality. Distended gallbladder with multiple gallstones. No gallbladder wall thickening or pericholecystic inflammatory change. Fundal adenomyomatosis. No biliary dilatation. Pancreas: Unremarkable. No pancreatic ductal dilatation or surrounding inflammatory changes. Spleen: Normal in size without focal abnormality. Adrenals/Urinary Tract: The adrenal glands are unremarkable. 2.2 cm simple cyst in the upper pole of the right kidney. Few punctate left renal calculi. No hydronephrosis. Bladder is decompressed. Stomach/Bowel: Stomach is within normal limits. Appendix appears normal. No evidence of bowel wall thickening, distention, or inflammatory changes. Moderate left-sided colonic diverticulosis. Vascular/Lymphatic: Aortic atherosclerosis. No enlarged abdominal or pelvic lymph nodes. Reproductive: Uterus and bilateral adnexa are unremarkable. Other: Small fat containing umbilical hernia. No free fluid or pneumoperitoneum. Musculoskeletal: No acute or significant osseous findings. IMPRESSION: 1. Cholelithiasis with distended gallbladder. No CT evidence of acute cholecystitis. Consider further evaluation with right upper quadrant ultrasound as clinically indicated. 2. Punctate nonobstructive left nephrolithiasis. 3. Aortic Atherosclerosis (ICD10-I70.0). Electronically Signed   By: WTitus DubinM.D.   On: 07/21/2021 07:19   MR 3D Recon At Scanner  Result Date: 07/22/2021 CLINICAL DATA:  Cholelithiasis, jaundice EXAM: MRI  ABDOMEN WITHOUT AND WITH CONTRAST (INCLUDING MRCP) TECHNIQUE: Multiplanar multisequence MR imaging of the abdomen was performed both before and after the administration of intravenous contrast. Heavily T2-weighted images of the biliary and pancreatic ducts were obtained, and three-dimensional MRCP images were rendered by post processing. CONTRAST:  826mGADAVIST GADOBUTROL 1 MMOL/ML IV SOLN COMPARISON:  CT abdomen pelvis and abdominal ultrasound July 21, 2021. FINDINGS: Lower chest: Eventration of the right hemidiaphragm. No acute abnormality. Hepatobiliary: Mild loss of signal in the hepatic parenchyma on out of phase imaging, consistent with hepatic steatosis. No suspicious hepatic lesion. Periportal edema. Cholelithiasis with dilation of the gallbladder and trace pericholecystic fluid. No gallbladder wall thickening or abnormal wall enhancement. No biliary ductal dilation.  No choledocholithiasis. Pancreas: No pancreatic divisum. No evidence of acute inflammation. No pancreatic ductal dilation. No cystic or arterially enhancing pancreatic lesions. Spleen:  Unremarkable. Adrenals/Urinary Tract: Bilateral adrenal glands are unremarkable. 2.1 cm right upper pole renal cyst. No solid enhancing renal masses. Stomach/Bowel: Stomach is unremarkable. No pathologic dilation of small or large bowel in the abdomen. Colonic diverticulosis without findings of acute diverticulitis. Vascular/Lymphatic: No abdominal aortic aneurysm. The portal, splenic and superior mesenteric veins are patent. No pathologically enlarged abdominal lymph nodes. Other:  No abdominal ascites. Musculoskeletal: No suspicious bone lesions identified. Levoconvex curvature of the lumbar spine. Multilevel degenerative changes spine. IMPRESSION: 1. No biliary ductal dilation. No choledocholithiasis. 2. Mild hepatic steatosis. 3. Cholelithiasis with dilation of the gallbladder and trace pericholecystic fluid. No gallbladder wall thickening or abnormal  wall enhancement. Findings which are equivocal for acute cholecystitis. Consider further evaluation with nuclear medicine HIDA scan to assess cystic duct patency if clinically indicated. Electronically Signed   By: JeDahlia Bailiff.D.   On: 07/21/2021 21:38   MR ABDOMEN MRCP W WO CONTAST  Result Date: 07/21/2021 CLINICAL DATA:  Cholelithiasis, jaundice EXAM: MRI ABDOMEN WITHOUT AND WITH CONTRAST (INCLUDING MRCP) TECHNIQUE: Multiplanar multisequence MR imaging of the abdomen was performed both  before and after the administration of intravenous contrast. Heavily T2-weighted images of the biliary and pancreatic ducts were obtained, and three-dimensional MRCP images were rendered by post processing. CONTRAST:  70m GADAVIST GADOBUTROL 1 MMOL/ML IV SOLN COMPARISON:  CT abdomen pelvis and abdominal ultrasound July 21, 2021. FINDINGS: Lower chest: Eventration of the right hemidiaphragm. No acute abnormality. Hepatobiliary: Mild loss of signal in the hepatic parenchyma on out of phase imaging, consistent with hepatic steatosis. No suspicious hepatic lesion. Periportal edema. Cholelithiasis with dilation of the gallbladder and trace pericholecystic fluid. No gallbladder wall thickening or abnormal wall enhancement. No biliary ductal dilation.  No choledocholithiasis. Pancreas: No pancreatic divisum. No evidence of acute inflammation. No pancreatic ductal dilation. No cystic or arterially enhancing pancreatic lesions. Spleen:  Unremarkable. Adrenals/Urinary Tract: Bilateral adrenal glands are unremarkable. 2.1 cm right upper pole renal cyst. No solid enhancing renal masses. Stomach/Bowel: Stomach is unremarkable. No pathologic dilation of small or large bowel in the abdomen. Colonic diverticulosis without findings of acute diverticulitis. Vascular/Lymphatic: No abdominal aortic aneurysm. The portal, splenic and superior mesenteric veins are patent. No pathologically enlarged abdominal lymph nodes. Other:  No abdominal  ascites. Musculoskeletal: No suspicious bone lesions identified. Levoconvex curvature of the lumbar spine. Multilevel degenerative changes spine. IMPRESSION: 1. No biliary ductal dilation. No choledocholithiasis. 2. Mild hepatic steatosis. 3. Cholelithiasis with dilation of the gallbladder and trace pericholecystic fluid. No gallbladder wall thickening or abnormal wall enhancement. Findings which are equivocal for acute cholecystitis. Consider further evaluation with nuclear medicine HIDA scan to assess cystic duct patency if clinically indicated. Electronically Signed   By: JDahlia BailiffM.D.   On: 07/21/2021 21:38   UKoreaAbdomen Limited RUQ (LIVER/GB)  Result Date: 07/21/2021 CLINICAL DATA:  Gallstones EXAM: ULTRASOUND ABDOMEN LIMITED RIGHT UPPER QUADRANT COMPARISON:  Same day CT abdomen/pelvis FINDINGS: Gallbladder: There are shadowing stones in the gallbladder measuring up to 1.1 cm. There is no gallbladder wall thickening or pericholecystic fluid. There was no sonographic Murphy's sign reported. Common bile duct: Diameter: 4 mm Liver: No focal lesion identified. Parenchymal echogenicity is mildly increased. Portal vein is patent on color Doppler imaging with normal direction of blood flow towards the liver. Other: None. IMPRESSION: 1. Cholelithiasis without evidence of acute cholecystitis. 2. Mild hepatic steatosis. Electronically Signed   By: PValetta MoleM.D.   On: 07/21/2021 08:23      Assessment/Plan Acute cholecystitis  Possible choledocholithiasis - cholelithiasis noted on imaging and US/CT were not convincing for cholecystitis - MRCP suggestive of possible cholecystitis and negative for choledocholithiasis - HIDA positive for cystic duct occlusion consistent with cholecystitis - no leukocytosis and afebrile, but recommend IV abx given positive HIDA and pain on exam  - LFTs trending down but Tbili stable around 3.5 - Hep panel negative and CMV negative; EBV, ANA, HSV and some other labs  still pending  - recommend repeating LFTs in AM - if Tbili <4.0 then will likely plan for laparoscopic cholecystectomy with IOC tomorrow. If Tbili > 4.0 tomorrow would recommend consideration of ERCP per ACS guidelines  FEN: ok to have CLD this evening but will make NPO after MN, IVF per TRH VTE: SCDs ID: [email or web address] ordered   KNorm Parcel [facility] 07/22/2021, 3:39 PM Please see Amion for pager number during day hours 7:00am-4:30pm

## 2021-07-22 NOTE — Progress Notes (Signed)
Progress Note    Sarath Talavera  [id number] DOB: 09/27/1961  DOA: 07/21/2021 PCP: Pcp, No    Brief Narrative:     Medical records reviewed and are as summarized below:  Michele Arnold is an 60 y.o. female with past medical history significant for HTN. She presents with upper abdominal pain.  Assessment/Plan:   Active Problems:   Abdominal pain    Cholelithiasis with distended gallbladder     - [facility] onboard; appreciate assistance     - hepatitis panel is negative -MRCP inconclusive     - HIDA pending    Hypokalemia     - replete   Hematuria     - outpatient follow up   Hx of HTN     - resume lisinopril   Hyperglycemia     - HgbA1c: 5.6  obesity Body mass index is 31.99 kg/m.   Family Communication/Anticipated D/C date and plan/Code Status   DVT prophylaxis: Lovenox ordered. Code Status: Full Code.  Disposition Plan: Status is: Inpatient  Remains inpatient appropriate because:Inpatient level of care appropriate due to severity of illness  Dispo: The patient is from: Home              Anticipated d/c is to: Home              Patient currently is not medically stable to d/c.   Difficult to place patient No         Medical Consultants:   GI  Subjective:   In HIDA  Objective:    Vitals:   07/21/21 1945 07/21/21 2305 07/22/21 0352 07/22/21 1301  BP: 137/68 (!) 149/77 116/60 (!) 159/66  Pulse: 68 66 67 68  Resp: '20 20 18 18  '$ Temp: 98.9 F (37.2 C) 98.7 F (37.1 C) 98.6 F (37 C) 97.9 F (36.6 C)  TempSrc: Oral Oral Oral Oral  SpO2: 97% 97% 93% 96%  Weight:      Height:        Intake/Output Summary (Last 24 hours) at 07/22/2021 1328 Last data filed at 07/21/2021 1600 Gross per 24 hour  Intake 0 ml  Output --  Net 0 ml   Filed Weights   07/21/21 0530 07/21/21 1611  Weight: 79.4 kg 76.8 kg    Exam:     Data Reviewed:   I have personally reviewed following labs and imaging studies:  Labs: Labs  show the following:   Basic Metabolic Panel: Recent Labs  Lab 07/21/21 0531 07/21/21 1723 07/22/21 0511  NA 137  --  142  K 3.4*  --  3.8  CL 102  --  114*  CO2 25  --  23  GLUCOSE 179*  --  98  BUN 14  --  11  CREATININE 0.61  --  0.74  CALCIUM 9.5  --  9.0  MG  --  2.3  --    GFR Estimated Creatinine Clearance: 72.8 mL/min (by C-G formula based on SCr of 0.74 mg/dL). Liver Function Tests: Recent Labs  Lab 07/21/21 0531 07/22/21 0511  AST 1,006* 336*  ALT 530* 423*  ALKPHOS 138* 155*  BILITOT 3.4* 3.5*  PROT 8.0 7.1  ALBUMIN 4.3 3.7   Recent Labs  Lab 07/21/21 0531  LIPASE 33   No results for input(s): AMMONIA in the last 168 hours. Coagulation profile Recent Labs  Lab 07/22/21 0511  INR 0.9    CBC: Recent Labs  Lab 07/21/21 0532 07/22/21 0511  WBC 6.1 5.9  NEUTROABS 3.4  --   HGB 14.3 12.8  HCT 43.2 38.3  MCV 95.6 96.2  PLT 446* 355   Cardiac Enzymes: No results for input(s): CKTOTAL, CKMB, CKMBINDEX, TROPONINI in the last 168 hours. BNP (last 3 results) No results for input(s): PROBNP in the last 8760 hours. CBG: No results for input(s): GLUCAP in the last 168 hours. D-Dimer: No results for input(s): DDIMER in the last 72 hours. Hgb A1c: Recent Labs    07/21/21 1723  HGBA1C 5.6   Lipid Profile: No results for input(s): CHOL, HDL, LDLCALC, TRIG, CHOLHDL, LDLDIRECT in the last 72 hours. Thyroid function studies: No results for input(s): TSH, T4TOTAL, T3FREE, THYROIDAB in the last 72 hours.  Invalid input(s): FREET3 Anemia work up: Recent Labs    07/21/21 1355  FERRITIN 461*  TIBC 378  IRON 197*   Sepsis Labs: Recent Labs  Lab 07/21/21 0532 07/22/21 0511  WBC 6.1 5.9    Microbiology Recent Results (from the past 240 hour(s))  Resp Panel by RT-PCR (Flu A&B, Covid) Nasopharyngeal Swab     Status: None   Collection Time: 07/21/21  6:36 AM   Specimen: Nasopharyngeal Swab; Nasopharyngeal(NP) swabs in vial transport medium   Result Value Ref Range Status   SARS Coronavirus 2 by RT PCR NEGATIVE NEGATIVE Final    Comment: (NOTE) SARS-CoV-2 target nucleic acids are NOT DETECTED.  The SARS-CoV-2 RNA is generally detectable in upper respiratory specimens during the acute phase of infection. The lowest concentration of SARS-CoV-2 viral copies this assay can detect is 138 copies/mL. A negative result does not preclude SARS-Cov-2 infection and should not be used as the sole basis for treatment or other patient management decisions. A negative result may occur with  improper specimen collection/handling, submission of specimen other than nasopharyngeal swab, presence of viral mutation(s) within the areas targeted by this assay, and inadequate number of viral copies(<138 copies/mL). A negative result must be combined with clinical observations, patient history, and epidemiological information. The expected result is Negative.  Fact Sheet for Patients:  EntrepreneurPulse.com.au  Fact Sheet for Healthcare Providers:  IncredibleEmployment.be  This test is no t yet approved or cleared by the [place] FDA and  has been authorized for detection and/or diagnosis of SARS-CoV-2 by FDA under an Emergency Use Authorization (EUA). This EUA will remain  in effect (meaning this test can be used) for the duration of the COVID-19 declaration under Section 564(b)(1) of the Act, 21 U.S.C.section 360bbb-3(b)(1), unless the authorization is terminated  or revoked sooner.       Influenza A by PCR NEGATIVE NEGATIVE Final   Influenza B by PCR NEGATIVE NEGATIVE Final    Comment: (NOTE) The Xpert Xpress SARS-CoV-2/FLU/RSV plus assay is intended as an aid in the diagnosis of influenza from Nasopharyngeal swab specimens and should not be used as a sole basis for treatment. Nasal washings and aspirates are unacceptable for Xpert Xpress SARS-CoV-2/FLU/RSV testing.  Fact Sheet for  Patients: EntrepreneurPulse.com.au  Fact Sheet for Healthcare Providers: IncredibleEmployment.be  This test is not yet approved or cleared by the [place] FDA and has been authorized for detection and/or diagnosis of SARS-CoV-2 by FDA under an Emergency Use Authorization (EUA). This EUA will remain in effect (meaning this test can be used) for the duration of the COVID-19 declaration under Section 564(b)(1) of the Act, 21 U.S.C. section 360bbb-3(b)(1), unless the authorization is terminated or revoked.  Performed at [facility], [place] [address], [place], [place] [address]  Procedures and diagnostic studies:  CT ABDOMEN PELVIS W CONTRAST  Result Date: 07/21/2021 CLINICAL DATA:  Epigastric pain. EXAM: CT ABDOMEN AND PELVIS WITH CONTRAST TECHNIQUE: Multidetector CT imaging of the abdomen and pelvis was performed using the standard protocol following bolus administration of intravenous contrast. CONTRAST:  85m OMNIPAQUE IOHEXOL 350 MG/ML SOLN COMPARISON:  None. FINDINGS: Lower chest: No acute abnormality. Subsegmental atelectasis at the lung bases. Hepatobiliary: No focal liver abnormality. Distended gallbladder with multiple gallstones. No gallbladder wall thickening or pericholecystic inflammatory change. Fundal adenomyomatosis. No biliary dilatation. Pancreas: Unremarkable. No pancreatic ductal dilatation or surrounding inflammatory changes. Spleen: Normal in size without focal abnormality. Adrenals/Urinary Tract: The adrenal glands are unremarkable. 2.2 cm simple cyst in the upper pole of the right kidney. Few punctate left renal calculi. No hydronephrosis. Bladder is decompressed. Stomach/Bowel: Stomach is within normal limits. Appendix appears normal. No evidence of bowel wall thickening, distention, or inflammatory changes. Moderate left-sided colonic diverticulosis. Vascular/Lymphatic: Aortic atherosclerosis. No enlarged  abdominal or pelvic lymph nodes. Reproductive: Uterus and bilateral adnexa are unremarkable. Other: Small fat containing umbilical hernia. No free fluid or pneumoperitoneum. Musculoskeletal: No acute or significant osseous findings. IMPRESSION: 1. Cholelithiasis with distended gallbladder. No CT evidence of acute cholecystitis. Consider further evaluation with right upper quadrant ultrasound as clinically indicated. 2. Punctate nonobstructive left nephrolithiasis. 3. Aortic Atherosclerosis (ICD10-I70.0). Electronically Signed   By: WTitus DubinM.D.   On: 07/21/2021 07:19   MR 3D Recon At Scanner  Result Date: 07/22/2021 CLINICAL DATA:  Cholelithiasis, jaundice EXAM: MRI ABDOMEN WITHOUT AND WITH CONTRAST (INCLUDING MRCP) TECHNIQUE: Multiplanar multisequence MR imaging of the abdomen was performed both before and after the administration of intravenous contrast. Heavily T2-weighted images of the biliary and pancreatic ducts were obtained, and three-dimensional MRCP images were rendered by post processing. CONTRAST:  851mGADAVIST GADOBUTROL 1 MMOL/ML IV SOLN COMPARISON:  CT abdomen pelvis and abdominal ultrasound July 21, 2021. FINDINGS: Lower chest: Eventration of the right hemidiaphragm. No acute abnormality. Hepatobiliary: Mild loss of signal in the hepatic parenchyma on out of phase imaging, consistent with hepatic steatosis. No suspicious hepatic lesion. Periportal edema. Cholelithiasis with dilation of the gallbladder and trace pericholecystic fluid. No gallbladder wall thickening or abnormal wall enhancement. No biliary ductal dilation.  No choledocholithiasis. Pancreas: No pancreatic divisum. No evidence of acute inflammation. No pancreatic ductal dilation. No cystic or arterially enhancing pancreatic lesions. Spleen:  Unremarkable. Adrenals/Urinary Tract: Bilateral adrenal glands are unremarkable. 2.1 cm right upper pole renal cyst. No solid enhancing renal masses. Stomach/Bowel: Stomach is  unremarkable. No pathologic dilation of small or large bowel in the abdomen. Colonic diverticulosis without findings of acute diverticulitis. Vascular/Lymphatic: No abdominal aortic aneurysm. The portal, splenic and superior mesenteric veins are patent. No pathologically enlarged abdominal lymph nodes. Other:  No abdominal ascites. Musculoskeletal: No suspicious bone lesions identified. Levoconvex curvature of the lumbar spine. Multilevel degenerative changes spine. IMPRESSION: 1. No biliary ductal dilation. No choledocholithiasis. 2. Mild hepatic steatosis. 3. Cholelithiasis with dilation of the gallbladder and trace pericholecystic fluid. No gallbladder wall thickening or abnormal wall enhancement. Findings which are equivocal for acute cholecystitis. Consider further evaluation with nuclear medicine HIDA scan to assess cystic duct patency if clinically indicated. Electronically Signed   By: JeDahlia Bailiff.D.   On: 07/21/2021 21:38   MR ABDOMEN MRCP W WO CONTAST  Result Date: 07/21/2021 CLINICAL DATA:  Cholelithiasis, jaundice EXAM: MRI ABDOMEN WITHOUT AND WITH CONTRAST (INCLUDING MRCP) TECHNIQUE: Multiplanar multisequence MR imaging of the abdomen was performed both before  and after the administration of intravenous contrast. Heavily T2-weighted images of the biliary and pancreatic ducts were obtained, and three-dimensional MRCP images were rendered by post processing. CONTRAST:  54m GADAVIST GADOBUTROL 1 MMOL/ML IV SOLN COMPARISON:  CT abdomen pelvis and abdominal ultrasound July 21, 2021. FINDINGS: Lower chest: Eventration of the right hemidiaphragm. No acute abnormality. Hepatobiliary: Mild loss of signal in the hepatic parenchyma on out of phase imaging, consistent with hepatic steatosis. No suspicious hepatic lesion. Periportal edema. Cholelithiasis with dilation of the gallbladder and trace pericholecystic fluid. No gallbladder wall thickening or abnormal wall enhancement. No biliary ductal  dilation.  No choledocholithiasis. Pancreas: No pancreatic divisum. No evidence of acute inflammation. No pancreatic ductal dilation. No cystic or arterially enhancing pancreatic lesions. Spleen:  Unremarkable. Adrenals/Urinary Tract: Bilateral adrenal glands are unremarkable. 2.1 cm right upper pole renal cyst. No solid enhancing renal masses. Stomach/Bowel: Stomach is unremarkable. No pathologic dilation of small or large bowel in the abdomen. Colonic diverticulosis without findings of acute diverticulitis. Vascular/Lymphatic: No abdominal aortic aneurysm. The portal, splenic and superior mesenteric veins are patent. No pathologically enlarged abdominal lymph nodes. Other:  No abdominal ascites. Musculoskeletal: No suspicious bone lesions identified. Levoconvex curvature of the lumbar spine. Multilevel degenerative changes spine. IMPRESSION: 1. No biliary ductal dilation. No choledocholithiasis. 2. Mild hepatic steatosis. 3. Cholelithiasis with dilation of the gallbladder and trace pericholecystic fluid. No gallbladder wall thickening or abnormal wall enhancement. Findings which are equivocal for acute cholecystitis. Consider further evaluation with nuclear medicine HIDA scan to assess cystic duct patency if clinically indicated. Electronically Signed   By: JDahlia BailiffM.D.   On: 07/21/2021 21:38   [place]Abdomen Limited RUQ (LIVER/GB)  Result Date: 07/21/2021 CLINICAL DATA:  Gallstones EXAM: ULTRASOUND ABDOMEN LIMITED RIGHT UPPER QUADRANT COMPARISON:  Same day CT abdomen/pelvis FINDINGS: Gallbladder: There are shadowing stones in the gallbladder measuring up to 1.1 cm. There is no gallbladder wall thickening or pericholecystic fluid. There was no sonographic Murphy's sign reported. Common bile duct: Diameter: 4 mm Liver: No focal lesion identified. Parenchymal echogenicity is mildly increased. Portal vein is patent on color Doppler imaging with normal direction of blood flow towards the liver. Other: None.  IMPRESSION: 1. Cholelithiasis without evidence of acute cholecystitis. 2. Mild hepatic steatosis. Electronically Signed   By: PValetta MoleM.D.   On: 07/21/2021 08:23    Medications:    lisinopril  10 mg Oral Daily   Continuous Infusions:   LOS: 1 day   JGeradine Girt [facility]   How to contact the TMarshall County HospitalAttending or Consulting provider [place] covering provider during after hours [place] for this patient?  Check the care team in [facility]and look for a) attending/consulting [facility] provider listed and b) the TMethod[facility] Hospital-Southlaketeam listed Log into [email or web address] and use [facility]'s universal password to access. If you do not have the password, please contact the hospital operator. Locate the TRe[facility], LLCprovider you are looking for under [facility] and page to a number that you can be directly reached. If you still have difficulty reaching the provider, please page the [facility](Director on Call) for the Hospitalists listed on amion for assistance.  07/22/2021, 1:28 PM

## 2021-07-22 NOTE — Progress Notes (Signed)
Discussed with RN. Patient has only had clear liquids today and thus will remain on clear liquid diet while awaiting surgical consultation.

## 2021-07-22 NOTE — Progress Notes (Signed)
Interpreter Darrel Reach [phone]  MD order for consent. Will have laparoscopic procedure done.   Reviewed informed consent for procedure via interpreter with pt, husband, and son. She went onto state risks were discussed with doctor who had visited room but was unsure of name.  Pt verbalizes understanding of signing consent and okay with signing.  Only question pt had was when would she be discharged after procedure but husband states previous MD told them if procedure early in AM then she could d/c later in day or next day.

## 2021-07-22 NOTE — Progress Notes (Signed)
HIDA consistent with acute cholecystitis.   CCS consult placed.   De-escalate diet to clear liquids.

## 2021-07-22 NOTE — Progress Notes (Signed)
[facility] Gastroenterology Progress Note  Michele Arnold 60 y.o. 11-Jun-1961  CC:  Abnormal LFTs, abdominal pain  Subjective: Patient states pain is currently well-controlled on pain regimen. Denies fever nausea, vomiting, melena, or hematochezia.  ROS : Review of Systems  Cardiovascular:  Negative for chest pain and palpitations.  Gastrointestinal:  Positive for abdominal pain. Negative for blood in stool, constipation, diarrhea, heartburn, melena, nausea and vomiting.   Objective: Vital signs in last 24 hours: Vitals:   07/21/21 2305 07/22/21 0352  BP: (!) 149/77 116/60  Pulse: 66 67  Resp: 20 18  Temp: 98.7 F (37.1 C) 98.6 F (37 C)  SpO2: 97% 93%    Physical Exam:  General:  Alert, cooperative, no distress  Head:  Normocephalic, without obvious abnormality, atraumatic  Eyes:  Scleral icterus, EOMs intact  Lungs:   Clear to auscultation bilaterally, respirations unlabored  Heart:  Regular rate and rhythm, S1, S2 normal  Abdomen:   Soft, non-tender, and non-distended; normoactive bowel sounds      Lab Results: Recent Labs    07/21/21 0531 07/21/21 1723 07/22/21 0511  NA 137  --  142  K 3.4*  --  3.8  CL 102  --  114*  CO2 25  --  23  GLUCOSE 179*  --  98  BUN 14  --  11  CREATININE 0.61  --  0.74  CALCIUM 9.5  --  9.0  MG  --  2.3  --    Recent Labs    07/21/21 0531 07/22/21 0511  AST 1,006* 336*  ALT 530* 423*  ALKPHOS 138* 155*  BILITOT 3.4* 3.5*  PROT 8.0 7.1  ALBUMIN 4.3 3.7   Recent Labs    07/21/21 0532 07/22/21 0511  WBC 6.1 5.9  NEUTROABS 3.4  --   HGB 14.3 12.8  HCT 43.2 38.3  MCV 95.6 96.2  PLT 446* 355   Recent Labs    07/22/21 0511  LABPROT 12.4  INR 0.9     Assessment: Abnormal LFTs, abdominal pain: suspicious for infectious etiology (EBV, HSV, CMV). -MRI/MRCP showed no biliary dilation or CBD stones.  Cholelithiasis with dilation of gallbladder and trace pericholecystic fluid, equivocal for CBD stones. -T. Bili  3.5, stable -AST 336/ ALT 423/ ALP 155, improved from yesterday AST 1006/ ALT 530/ ALP 138 -CMV IgM negative, HSV and EBV IgM pending -Normal PT/INR  -Negative acute hepatitis panel -No leukocytosis -Normal platelets 355K/uL -Acetaminophen level <10 -Normal lipase -Elevated ferritin (461), iron (197) and iron saturation (52), which could be reactive, but recommend proceeding with hemochromatosis DNA testing -ANA, AMA, ASMA, alpha-1 anti-trypsin, and ceruloplasmin pending    Plan: Due to abdominal pain, HIDA was ordered to evaluate cystic duct patency and further evaluate for acute cholecystitis.  Awaiting results.  Continue supportive care. Continue to trend LFTs.  Hemochromatosis DNA testing ordered, given elevated ferritin, serum iron, and iron saturation.  Trial of soft diet.  [facility] will follow.   [name] PA-C 07/22/2021, 10:43 AM  Contact #  [phone]

## 2021-07-23 ENCOUNTER — Inpatient Hospital Stay (HOSPITAL_COMMUNITY): Payer: Self-pay | Admitting: Certified Registered Nurse Anesthetist

## 2021-07-23 ENCOUNTER — Encounter (HOSPITAL_COMMUNITY): Payer: Self-pay | Admitting: Internal Medicine

## 2021-07-23 ENCOUNTER — Encounter (HOSPITAL_COMMUNITY)
Admission: EM | Disposition: A | Discharge status: Home / Self Care | Payer: Self-pay | Source: Home / Self Care | Attending: Internal Medicine

## 2021-07-23 HISTORY — PX: CHOLECYSTECTOMY: SHX55

## 2021-07-23 LAB — ANA W/REFLEX IF POSITIVE: Anti Nuclear Antibody (ANA): NEGATIVE

## 2021-07-23 LAB — COMPREHENSIVE METABOLIC PANEL
ALT: 265 U/L — ABNORMAL HIGH (ref 0–44)
AST: 111 U/L — ABNORMAL HIGH (ref 15–41)
Albumin: 3.6 g/dL (ref 3.5–5.0)
Alkaline Phosphatase: 132 U/L — ABNORMAL HIGH (ref 38–126)
Anion gap: 8 (ref 5–15)
BUN: 10 mg/dL (ref 6–20)
CO2: 22 mmol/L (ref 22–32)
Calcium: 9.1 mg/dL (ref 8.9–10.3)
Chloride: 113 mmol/L — ABNORMAL HIGH (ref 98–111)
Creatinine, Ser: 0.67 mg/dL (ref 0.44–1.00)
GFR, Estimated: >60 mL/min (ref >60)
Glucose, Bld: 99 mg/dL (ref 70–99)
Potassium: 3.9 mmol/L (ref 3.5–5.1)
Sodium: 143 mmol/L (ref 135–145)
Total Bilirubin: 1.5 mg/dL — ABNORMAL HIGH (ref 0.3–1.2)
Total Protein: 6.9 g/dL (ref 6.5–8.1)

## 2021-07-23 LAB — CBC
HCT: 38.1 % (ref 36.0–46.0)
Hemoglobin: 12.8 g/dL (ref 12.0–15.0)
MCH: 32.5 pg (ref 26.0–34.0)
MCHC: 33.6 g/dL (ref 30.0–36.0)
MCV: 96.7 fL (ref 80.0–100.0)
Platelets: 351 10*3/uL (ref 150–400)
RBC: 3.94 MIL/uL (ref 3.87–5.11)
RDW: 13 % (ref 11.5–15.5)
WBC: 6.2 10*3/uL (ref 4.0–10.5)
nRBC: 0 % (ref 0.0–0.2)

## 2021-07-23 SURGERY — CHOLECYSTECTOMY, ROBOT-ASSISTED, LAPAROSCOPIC
Anesthesia: General | Site: Abdomen

## 2021-07-23 MED ORDER — LACTATED RINGERS IV SOLN: INTRAVENOUS | Status: DC | PRN

## 2021-07-23 MED ORDER — ROCURONIUM BROMIDE 10 MG/ML (PF) SYRINGE
PREFILLED_SYRINGE | INTRAVENOUS | Status: AC
  Filled 2021-07-23: qty 10

## 2021-07-23 MED ORDER — MIDAZOLAM HCL 5 MG/5ML IJ SOLN
INTRAMUSCULAR | Status: DC | PRN
  Administered 2021-07-23: 2 mg via INTRAVENOUS

## 2021-07-23 MED ORDER — PROPOFOL 10 MG/ML IV BOLUS
INTRAVENOUS | Status: DC | PRN
  Administered 2021-07-23: 170 mg via INTRAVENOUS

## 2021-07-23 MED ORDER — DEXAMETHASONE SODIUM PHOSPHATE 10 MG/ML IJ SOLN
INTRAMUSCULAR | Status: AC
  Filled 2021-07-23: qty 1

## 2021-07-23 MED ORDER — SUGAMMADEX SODIUM 200 MG/2ML IV SOLN
INTRAVENOUS | Status: DC | PRN
  Administered 2021-07-23: 200 mg via INTRAVENOUS

## 2021-07-23 MED ORDER — DEXAMETHASONE SODIUM PHOSPHATE 10 MG/ML IJ SOLN
INTRAMUSCULAR | Status: DC | PRN
  Administered 2021-07-23: 10 mg via INTRAVENOUS

## 2021-07-23 MED ORDER — ONDANSETRON HCL 4 MG/2ML IJ SOLN
INTRAMUSCULAR | Status: AC
  Filled 2021-07-23: qty 2

## 2021-07-23 MED ORDER — FENTANYL CITRATE (PF) 250 MCG/5ML IJ SOLN
INTRAMUSCULAR | Status: AC
  Filled 2021-07-23: qty 5

## 2021-07-23 MED ORDER — HYDROMORPHONE HCL 1 MG/ML IJ SOLN: 0.2500 mg | INTRAMUSCULAR | Status: DC | PRN

## 2021-07-23 MED ORDER — FENTANYL CITRATE (PF) 100 MCG/2ML IJ SOLN
INTRAMUSCULAR | Status: DC | PRN
  Administered 2021-07-23 (×5): 50 ug via INTRAVENOUS
  Administered 2021-07-23: 100 ug via INTRAVENOUS

## 2021-07-23 MED ORDER — SCOPOLAMINE 1 MG/3DAYS TD PT72
MEDICATED_PATCH | TRANSDERMAL | Status: AC
  Filled 2021-07-23: qty 1

## 2021-07-23 MED ORDER — BUPIVACAINE LIPOSOME 1.3 % IJ SUSP
INTRAMUSCULAR | Status: AC
  Filled 2021-07-23: qty 20

## 2021-07-23 MED ORDER — SODIUM CHLORIDE (PF) 0.9 % IJ SOLN
INTRAMUSCULAR | Status: DC | PRN
  Administered 2021-07-23: 10 mL

## 2021-07-23 MED ORDER — OXYCODONE HCL 5 MG PO TABS: 5.0000 mg | ORAL_TABLET | Freq: Once | ORAL | Status: DC | PRN

## 2021-07-23 MED ORDER — IBUPROFEN 200 MG PO TABS: 600.0000 mg | ORAL_TABLET | Freq: Four times a day (QID) | ORAL | Status: DC | PRN

## 2021-07-23 MED ORDER — PHENYLEPHRINE 40 MCG/ML (10ML) SYRINGE FOR IV PUSH (FOR BLOOD PRESSURE SUPPORT)
PREFILLED_SYRINGE | INTRAVENOUS | Status: DC | PRN
  Administered 2021-07-23: 80 ug via INTRAVENOUS
  Administered 2021-07-23: 120 ug via INTRAVENOUS
  Administered 2021-07-23: 80 ug via INTRAVENOUS

## 2021-07-23 MED ORDER — POTASSIUM CHLORIDE IN NACL 20-0.9 MEQ/L-% IV SOLN
INTRAVENOUS | Status: DC
  Filled 2021-07-23 (×3): qty 1000

## 2021-07-23 MED ORDER — LIDOCAINE 2% (20 MG/ML) 5 ML SYRINGE
INTRAMUSCULAR | Status: AC
  Filled 2021-07-23: qty 5

## 2021-07-23 MED ORDER — LACTATED RINGERS IV SOLN: INTRAVENOUS | Status: DC

## 2021-07-23 MED ORDER — SODIUM CHLORIDE (PF) 0.9 % IJ SOLN
INTRAMUSCULAR | Status: AC
  Filled 2021-07-23: qty 10

## 2021-07-23 MED ORDER — LACTATED RINGERS IR SOLN
Status: DC | PRN
  Administered 2021-07-23: 1000 mL

## 2021-07-23 MED ORDER — ONDANSETRON HCL 4 MG/2ML IJ SOLN: 4.0000 mg | Freq: Once | INTRAMUSCULAR | Status: DC | PRN

## 2021-07-23 MED ORDER — INDOCYANINE GREEN 25 MG IV SOLR
2.5000 mg | INTRAVENOUS | Status: AC
  Administered 2021-07-23: 2.5 mg via INTRAVENOUS
  Filled 2021-07-23: qty 1

## 2021-07-23 MED ORDER — ROCURONIUM BROMIDE 10 MG/ML (PF) SYRINGE
PREFILLED_SYRINGE | INTRAVENOUS | Status: DC | PRN
  Administered 2021-07-23 (×2): 20 mg via INTRAVENOUS
  Administered 2021-07-23: 10 mg via INTRAVENOUS
  Administered 2021-07-23: 60 mg via INTRAVENOUS

## 2021-07-23 MED ORDER — PROPOFOL 10 MG/ML IV BOLUS
INTRAVENOUS | Status: AC
  Filled 2021-07-23: qty 40

## 2021-07-23 MED ORDER — LIDOCAINE 2% (20 MG/ML) 5 ML SYRINGE
INTRAMUSCULAR | Status: DC | PRN
  Administered 2021-07-23: 100 mg via INTRAVENOUS

## 2021-07-23 MED ORDER — OXYCODONE HCL 5 MG/5ML PO SOLN
5.0000 mg | Freq: Once | ORAL | Status: DC | PRN
Start: 2021-07-23 — End: 2021-07-23

## 2021-07-23 MED ORDER — OXYCODONE HCL 5 MG PO TABS: 5.0000 mg | ORAL_TABLET | ORAL | Status: DC | PRN

## 2021-07-23 MED ORDER — 0.9 % SODIUM CHLORIDE (POUR BTL) OPTIME
TOPICAL | Status: DC | PRN
  Administered 2021-07-23: 1000 mL

## 2021-07-23 MED ORDER — EPHEDRINE SULFATE-NACL 50-0.9 MG/10ML-% IV SOSY
PREFILLED_SYRINGE | INTRAVENOUS | Status: DC | PRN
  Administered 2021-07-23: 10 mg via INTRAVENOUS
  Administered 2021-07-23: 5 mg via INTRAVENOUS

## 2021-07-23 MED ORDER — SCOPOLAMINE 1 MG/3DAYS TD PT72: 1.0000 | MEDICATED_PATCH | TRANSDERMAL | Status: DC

## 2021-07-23 MED ORDER — ACETAMINOPHEN 325 MG PO TABS
650.0000 mg | ORAL_TABLET | Freq: Four times a day (QID) | ORAL | Status: DC
  Administered 2021-07-23 – 2021-07-24 (×4): 650 mg via ORAL
  Filled 2021-07-23 (×4): qty 2

## 2021-07-23 MED ORDER — FENTANYL CITRATE (PF) 100 MCG/2ML IJ SOLN
INTRAMUSCULAR | Status: AC
  Filled 2021-07-23: qty 2

## 2021-07-23 MED ORDER — MIDAZOLAM HCL 2 MG/2ML IJ SOLN
INTRAMUSCULAR | Status: AC
  Filled 2021-07-23: qty 2

## 2021-07-23 MED ORDER — EPHEDRINE 5 MG/ML INJ
INTRAVENOUS | Status: AC
  Filled 2021-07-23: qty 5

## 2021-07-23 MED ORDER — MORPHINE SULFATE (PF) 2 MG/ML IV SOLN
2.0000 mg | INTRAVENOUS | Status: DC | PRN
  Administered 2021-07-23 (×3): 2 mg via INTRAVENOUS
  Filled 2021-07-23 (×3): qty 1

## 2021-07-23 MED ORDER — BUPIVACAINE LIPOSOME 1.3 % IJ SUSP
INTRAMUSCULAR | Status: DC | PRN
  Administered 2021-07-23: 20 mL

## 2021-07-23 SURGICAL SUPPLY — 51 items
ADH SKN CLS APL DERMABOND .7 (GAUZE/BANDAGES/DRESSINGS) ×1
APPLIER CLIP 5 13 M/L LIGAMAX5 (MISCELLANEOUS)
APR CLP MED LRG 5 ANG JAW (MISCELLANEOUS)
BLADE SURG 15 STRL LF DISP TIS (BLADE) ×1 IMPLANT
BLADE SURG 15 STRL SS (BLADE) ×2
CLIP APPLIE 5 13 M/L LIGAMAX5 (MISCELLANEOUS) IMPLANT
CLIP LIGATING HEM O LOK PURPLE (MISCELLANEOUS) IMPLANT
CLIP LIGATING HEMO O LOK GREEN (MISCELLANEOUS) ×1 IMPLANT
COVER SURGICAL LIGHT HANDLE (MISCELLANEOUS) ×2 IMPLANT
COVER TIP SHEARS 8 DVNC (MISCELLANEOUS) ×1 IMPLANT
COVER TIP SHEARS 8MM DA VINCI (MISCELLANEOUS) ×2
DECANTER SPIKE VIAL GLASS SM (MISCELLANEOUS) ×2 IMPLANT
DERMABOND ADVANCED (GAUZE/BANDAGES/DRESSINGS) ×1
DERMABOND ADVANCED .7 DNX12 (GAUZE/BANDAGES/DRESSINGS) ×1 IMPLANT
DRAPE ARM DVNC X/XI (DISPOSABLE) ×4 IMPLANT
DRAPE COLUMN DVNC XI (DISPOSABLE) ×1 IMPLANT
DRAPE DA VINCI XI ARM (DISPOSABLE) ×8
DRAPE DA VINCI XI COLUMN (DISPOSABLE) ×2
ELECT REM PT RETURN 15FT ADLT (MISCELLANEOUS) ×2 IMPLANT
GLOVE SURG ENC TEXT LTX SZ8 (GLOVE) ×4 IMPLANT
GOWN STRL REUS W/TWL XL LVL3 (GOWN DISPOSABLE) ×7 IMPLANT
GRASPER SUT TROCAR 14GX15 (MISCELLANEOUS) IMPLANT
IRRIG SUCT STRYKERFLOW 2 WTIP (MISCELLANEOUS) ×2
IRRIGATION SUCT STRKRFLW 2 WTP (MISCELLANEOUS) IMPLANT
KIT BASIN OR (CUSTOM PROCEDURE TRAY) ×2 IMPLANT
KIT TURNOVER KIT A (KITS) ×2 IMPLANT
MANIFOLD NEPTUNE II (INSTRUMENTS) ×2 IMPLANT
MARKER SKIN DUAL TIP RULER LAB (MISCELLANEOUS) ×2 IMPLANT
NEEDLE HYPO 22GX1.5 SAFETY (NEEDLE) ×2 IMPLANT
OBTURATOR OPTICAL STANDARD 8MM (TROCAR) ×2
OBTURATOR OPTICAL STND 8 DVNC (TROCAR) ×1
OBTURATOR OPTICALSTD 8 DVNC (TROCAR) ×1 IMPLANT
PACK CARDIOVASCULAR III (CUSTOM PROCEDURE TRAY) ×2 IMPLANT
PAD POSITIONING PINK XL (MISCELLANEOUS) ×2 IMPLANT
SEAL CANN UNIV 5-8 DVNC XI (MISCELLANEOUS) ×4 IMPLANT
SEAL XI 5MM-8MM UNIVERSAL (MISCELLANEOUS) ×8
SEALER VESSEL DA VINCI XI (MISCELLANEOUS) ×2
SEALER VESSEL EXT DVNC XI (MISCELLANEOUS) ×1 IMPLANT
SOL ANTI FOG 6CC (MISCELLANEOUS) ×1 IMPLANT
SOLUTION ANTI FOG 6CC (MISCELLANEOUS) ×1
SOLUTION ELECTROLUBE (MISCELLANEOUS) ×2 IMPLANT
SPONGE T-LAP 18X18 ~~LOC~~+RFID (SPONGE) ×2 IMPLANT
SUT MNCRL AB 4-0 PS2 18 (SUTURE) ×3 IMPLANT
SUT VICRYL 0 TIES 12 18 (SUTURE) ×1 IMPLANT
SYR 20ML LL LF (SYRINGE) ×2 IMPLANT
SYS RETRIEVAL 5MM INZII UNIV (BASKET) ×2
SYSTEM RETRIEVL 5MM INZII UNIV (BASKET) IMPLANT
TOWEL OR 17X26 10 PK STRL BLUE (TOWEL DISPOSABLE) ×2 IMPLANT
TOWEL OR NON WOVEN STRL DISP B (DISPOSABLE) ×1 IMPLANT
TROCAR BLADELESS OPT 5 100 (ENDOMECHANICALS) ×2 IMPLANT
TUBING INSUFFLATION 10FT LAP (TUBING) ×2 IMPLANT

## 2021-07-23 NOTE — Progress Notes (Signed)
Progress Note    Michele Arnold  [id number] DOB: June 28, 1961  DOA: 07/21/2021 PCP: Pcp, No    Brief Narrative:     Medical records reviewed and are as summarized below:  Michele Arnold is an 60 y.o. female with past medical history significant for HTN. She presents with upper abdominal pain.  Found to have cholecystitis and is going to the OR on 9/8.    Assessment/Plan:   Active Problems:   Abdominal pain    Cholelithiasis with distended gallbladder     - [facility] consulted     - hepatitis panel is negative -MRCP inconclusive     - HIDA + for cholecystitis     - cholecystectomy on 9/8   Hypokalemia     - repleted   Hematuria     - outpatient follow up   Hx of HTN     - hold lisinopril   Hyperglycemia     - HgbA1c: 5.6  obesity Body mass index is 31.99 kg/m.   Family Communication/Anticipated D/C date and plan/Code Status   DVT prophylaxis: Lovenox ordered. Code Status: Full Code.  Disposition Plan: Status is: Inpatient  Remains inpatient appropriate because:Inpatient level of care appropriate due to severity of illness  Dispo: The patient is from: Home              Anticipated d/c is to: Home              Patient currently is not medically stable to d/c. Home in the AM?   Difficult to place patient No         Medical Consultants:   GI  Subjective:   Going down for surgery  Objective:    Vitals:   07/22/21 0352 07/22/21 1301 07/22/21 2008 07/23/21 0517  BP: 116/60 (!) 159/66 127/61 (!) 115/46  Pulse: 67 68 61 82  Resp: '18 18 20 20  '$ Temp: 98.6 F (37 C) 97.9 F (36.6 C) 98.5 F (36.9 C) 97.9 F (36.6 C)  TempSrc: Oral Oral Oral Oral  SpO2: 93% 96% 93% 97%  Weight:      Height:        Intake/Output Summary (Last 24 hours) at 07/23/2021 1143 Last data filed at 07/22/2021 1800 Gross per 24 hour  Intake 0 ml  Output --  Net 0 ml   Filed Weights   07/21/21 0530 07/21/21 1611  Weight: 79.4 kg 76.8 kg     Exam:   In bed, NAD  Data Reviewed:   I have personally reviewed following labs and imaging studies:  Labs: Labs show the following:   Basic Metabolic Panel: Recent Labs  Lab 07/21/21 0531 07/21/21 1723 07/22/21 0511 07/23/21 0453  NA 137  --  142 143  K 3.4*  --  3.8 3.9  CL 102  --  114* 113*  CO2 25  --  23 22  GLUCOSE 179*  --  98 99  BUN 14  --  11 10  CREATININE 0.61  --  0.74 0.67  CALCIUM 9.5  --  9.0 9.1  MG  --  2.3  --   --    GFR Estimated Creatinine Clearance: 72.8 mL/min (by C-G formula based on SCr of 0.67 mg/dL). Liver Function Tests: Recent Labs  Lab 07/21/21 0531 07/22/21 0511 07/23/21 0453  AST 1,006* 336* 111*  ALT 530* 423* 265*  ALKPHOS 138* 155* 132*  BILITOT 3.4* 3.5* 1.5*  PROT 8.0 7.1 6.9  ALBUMIN 4.3 3.7  3.6   Recent Labs  Lab 07/21/21 0531  LIPASE 33   No results for input(s): AMMONIA in the last 168 hours. Coagulation profile Recent Labs  Lab 07/22/21 0511  INR 0.9    CBC: Recent Labs  Lab 07/21/21 0532 07/22/21 0511 07/23/21 0453  WBC 6.1 5.9 6.2  NEUTROABS 3.4  --   --   HGB 14.3 12.8 12.8  HCT 43.2 38.3 38.1  MCV 95.6 96.2 96.7  PLT 446* 355 351   Cardiac Enzymes: No results for input(s): CKTOTAL, CKMB, CKMBINDEX, TROPONINI in the last 168 hours. BNP (last 3 results) No results for input(s): PROBNP in the last 8760 hours. CBG: No results for input(s): GLUCAP in the last 168 hours. D-Dimer: No results for input(s): DDIMER in the last 72 hours. Hgb A1c: Recent Labs    07/21/21 1723  HGBA1C 5.6   Lipid Profile: No results for input(s): CHOL, HDL, LDLCALC, TRIG, CHOLHDL, LDLDIRECT in the last 72 hours. Thyroid function studies: No results for input(s): TSH, T4TOTAL, T3FREE, THYROIDAB in the last 72 hours.  Invalid input(s): FREET3 Anemia work up: Recent Labs    07/21/21 1355  FERRITIN 461*  TIBC 378  IRON 197*   Sepsis Labs: Recent Labs  Lab 07/21/21 0532 07/22/21 0511 07/23/21 0453   WBC 6.1 5.9 6.2    Microbiology Recent Results (from the past 240 hour(s))  Resp Panel by RT-PCR (Flu A&B, Covid) Nasopharyngeal Swab     Status: None   Collection Time: 07/21/21  6:36 AM   Specimen: Nasopharyngeal Swab; Nasopharyngeal(NP) swabs in vial transport medium  Result Value Ref Range Status   SARS Coronavirus 2 by RT PCR NEGATIVE NEGATIVE Final    Comment: (NOTE) SARS-CoV-2 target nucleic acids are NOT DETECTED.  The SARS-CoV-2 RNA is generally detectable in upper respiratory specimens during the acute phase of infection. The lowest concentration of SARS-CoV-2 viral copies this assay can detect is 138 copies/mL. A negative result does not preclude SARS-Cov-2 infection and should not be used as the sole basis for treatment or other patient management decisions. A negative result may occur with  improper specimen collection/handling, submission of specimen other than nasopharyngeal swab, presence of viral mutation(s) within the areas targeted by this assay, and inadequate number of viral copies(<138 copies/mL). A negative result must be combined with clinical observations, patient history, and epidemiological information. The expected result is Negative.  Fact Sheet for Patients:  EntrepreneurPulse.com.au  Fact Sheet for Healthcare Providers:  IncredibleEmployment.be  This test is no t yet approved or cleared by the [place] FDA and  has been authorized for detection and/or diagnosis of SARS-CoV-2 by FDA under an Emergency Use Authorization (EUA). This EUA will remain  in effect (meaning this test can be used) for the duration of the COVID-19 declaration under Section 564(b)(1) of the Act, 21 U.S.C.section 360bbb-3(b)(1), unless the authorization is terminated  or revoked sooner.       Influenza A by PCR NEGATIVE NEGATIVE Final   Influenza B by PCR NEGATIVE NEGATIVE Final    Comment: (NOTE) The Xpert Xpress  SARS-CoV-2/FLU/RSV plus assay is intended as an aid in the diagnosis of influenza from Nasopharyngeal swab specimens and should not be used as a sole basis for treatment. Nasal washings and aspirates are unacceptable for Xpert Xpress SARS-CoV-2/FLU/RSV testing.  Fact Sheet for Patients: EntrepreneurPulse.com.au  Fact Sheet for Healthcare Providers: IncredibleEmployment.be  This test is not yet approved or cleared by the [place] FDA and has been authorized for detection  and/or diagnosis of SARS-CoV-2 by FDA under an Emergency Use Authorization (EUA). This EUA will remain in effect (meaning this test can be used) for the duration of the COVID-19 declaration under Section 564(b)(1) of the Act, 21 U.S.C. section 360bbb-3(b)(1), unless the authorization is terminated or revoked.  Performed at [facility], [place] [address], [place],  [address]     Procedures and diagnostic studies:  NM Hepatobiliary Liver Func  Result Date: 07/22/2021 CLINICAL DATA:  Epigastric pain, abnormal imaging of the gallbladder EXAM: NUCLEAR MEDICINE HEPATOBILIARY IMAGING TECHNIQUE: Sequential images of the abdomen were obtained out to 60 minutes following intravenous administration of radiopharmaceutical. Additional 30 minutes of scintigraphic imaging was obtained post administration of 3 mg of morphine. RADIOPHARMACEUTICALS:  7.6 mCi Tc-44m Choletec IV COMPARISON:  MRI, CT and ultrasound July 21, 2021 FINDINGS: Prompt uptake and biliary excretion of activity by the liver is seen. Gallbladder activity is not visualized before or after the administration of morphine. Additionally, there is a subtle rim of increased radiotracer activity in the hepatic parenchyma along the gallbladder fossa. Biliary activity passes into small bowel, consistent with patent common bile duct. IMPRESSION: Scintigraphic findings most consistent with acute cholecystitis.  These results will be called to the ordering clinician or representative by the Radiologist Assistant, and communication documented in the PACS or [employer] Corporation Electronically Signed   By: JDahlia BailiffM.D.   On: 07/22/2021 15:22   MR 3D Recon At Scanner  Result Date: 07/22/2021 CLINICAL DATA:  Cholelithiasis, jaundice EXAM: MRI ABDOMEN WITHOUT AND WITH CONTRAST (INCLUDING MRCP) TECHNIQUE: Multiplanar multisequence MR imaging of the abdomen was performed both before and after the administration of intravenous contrast. Heavily T2-weighted images of the biliary and pancreatic ducts were obtained, and three-dimensional MRCP images were rendered by post processing. CONTRAST:  864mGADAVIST GADOBUTROL 1 MMOL/ML IV SOLN COMPARISON:  CT abdomen pelvis and abdominal ultrasound July 21, 2021. FINDINGS: Lower chest: Eventration of the right hemidiaphragm. No acute abnormality. Hepatobiliary: Mild loss of signal in the hepatic parenchyma on out of phase imaging, consistent with hepatic steatosis. No suspicious hepatic lesion. Periportal edema. Cholelithiasis with dilation of the gallbladder and trace pericholecystic fluid. No gallbladder wall thickening or abnormal wall enhancement. No biliary ductal dilation.  No choledocholithiasis. Pancreas: No pancreatic divisum. No evidence of acute inflammation. No pancreatic ductal dilation. No cystic or arterially enhancing pancreatic lesions. Spleen:  Unremarkable. Adrenals/Urinary Tract: Bilateral adrenal glands are unremarkable. 2.1 cm right upper pole renal cyst. No solid enhancing renal masses. Stomach/Bowel: Stomach is unremarkable. No pathologic dilation of small or large bowel in the abdomen. Colonic diverticulosis without findings of acute diverticulitis. Vascular/Lymphatic: No abdominal aortic aneurysm. The portal, splenic and superior mesenteric veins are patent. No pathologically enlarged abdominal lymph nodes. Other:  No abdominal ascites. Musculoskeletal: No  suspicious bone lesions identified. Levoconvex curvature of the lumbar spine. Multilevel degenerative changes spine. IMPRESSION: 1. No biliary ductal dilation. No choledocholithiasis. 2. Mild hepatic steatosis. 3. Cholelithiasis with dilation of the gallbladder and trace pericholecystic fluid. No gallbladder wall thickening or abnormal wall enhancement. Findings which are equivocal for acute cholecystitis. Consider further evaluation with nuclear medicine HIDA scan to assess cystic duct patency if clinically indicated. Electronically Signed   By: JeDahlia Bailiff.D.   On: 07/21/2021 21:38   MR ABDOMEN MRCP W WO CONTAST  Result Date: 07/21/2021 CLINICAL DATA:  Cholelithiasis, jaundice EXAM: MRI ABDOMEN WITHOUT AND WITH CONTRAST (INCLUDING MRCP) TECHNIQUE: Multiplanar multisequence MR imaging of the abdomen was performed both  before and after the administration of intravenous contrast. Heavily T2-weighted images of the biliary and pancreatic ducts were obtained, and three-dimensional MRCP images were rendered by post processing. CONTRAST:  32m GADAVIST GADOBUTROL 1 MMOL/ML IV SOLN COMPARISON:  CT abdomen pelvis and abdominal ultrasound July 21, 2021. FINDINGS: Lower chest: Eventration of the right hemidiaphragm. No acute abnormality. Hepatobiliary: Mild loss of signal in the hepatic parenchyma on out of phase imaging, consistent with hepatic steatosis. No suspicious hepatic lesion. Periportal edema. Cholelithiasis with dilation of the gallbladder and trace pericholecystic fluid. No gallbladder wall thickening or abnormal wall enhancement. No biliary ductal dilation.  No choledocholithiasis. Pancreas: No pancreatic divisum. No evidence of acute inflammation. No pancreatic ductal dilation. No cystic or arterially enhancing pancreatic lesions. Spleen:  Unremarkable. Adrenals/Urinary Tract: Bilateral adrenal glands are unremarkable. 2.1 cm right upper pole renal cyst. No solid enhancing renal masses. Stomach/Bowel:  Stomach is unremarkable. No pathologic dilation of small or large bowel in the abdomen. Colonic diverticulosis without findings of acute diverticulitis. Vascular/Lymphatic: No abdominal aortic aneurysm. The portal, splenic and superior mesenteric veins are patent. No pathologically enlarged abdominal lymph nodes. Other:  No abdominal ascites. Musculoskeletal: No suspicious bone lesions identified. Levoconvex curvature of the lumbar spine. Multilevel degenerative changes spine. IMPRESSION: 1. No biliary ductal dilation. No choledocholithiasis. 2. Mild hepatic steatosis. 3. Cholelithiasis with dilation of the gallbladder and trace pericholecystic fluid. No gallbladder wall thickening or abnormal wall enhancement. Findings which are equivocal for acute cholecystitis. Consider further evaluation with nuclear medicine HIDA scan to assess cystic duct patency if clinically indicated. Electronically Signed   By: JDahlia BailiffM.D.   On: 07/21/2021 21:38    Medications:    [MAR Hold] lisinopril  10 mg Oral Daily   scopolamine  1 patch Transdermal Q72H   scopolamine       Continuous Infusions:  [MAR Hold] cefTRIAXone (ROCEPHIN)  IV 2 g (07/22/21 1854)   lactated ringers 20 mL/hr at 07/23/21 1057     LOS: 2 days   JGeradine Girt [facility]   How to contact the TUniversity [facility] CenterAttending or Consulting provider 7[place]or covering provider during after hours [place] for this patient?  Check the care team in [facility] LLCand look for a) attending/consulting TRH provider listed and b) the T[facility] Scsteam listed Log into [email or web address] and use [place]'s universal password to access. If you do not have the password, please contact the hospital operator. Locate the TEnd[facility] Center Of [facility] Dc LPprovider you are looking for under [facility] and page to a number that you can be directly reached. If you still have difficulty reaching the provider, please page the [facility]raham [facility](Director on Call) for the Hospitalists listed on amion for  assistance.  07/23/2021, 11:43 AM

## 2021-07-23 NOTE — Transfer of Care (Signed)
Immediate Anesthesia Transfer of Care Note  Patient: Michele Arnold  Procedure(s) Performed: XI ROBOTIC ASSISTED LAPAROSCOPIC CHOLECYSTECTOMY (Abdomen)  Patient Location: PACU  Anesthesia Type:General  Level of Consciousness: awake, alert  and oriented  Airway & Oxygen Therapy: Patient Spontanous Breathing and Patient connected to face mask  Post-op Assessment: Report given to RN and Post -op Vital signs reviewed and stable  Post vital signs: Reviewed and stable  Last Vitals:  Vitals Value Taken Time  BP    Temp    Pulse    Resp    SpO2      Last Pain:  Vitals:   07/23/21 0730  TempSrc:   PainSc: Asleep      Patients Stated Pain Goal: 0 ([id number])  Complications: No notable events documented.

## 2021-07-23 NOTE — Op Note (Signed)
Michele Arnold  04-01-1961   07/23/2021    PCP:  Pcp, No   Surgeon: Kaylyn Lim, MD, FACS  Asst:  none  Anes:  general  Preop Dx: Acute cholecystitis Postop Dx: Acute cholecystitis with 1 cm stone impacted in the infundibulum  Procedure: Xi robotic cholecystectomy with ICG Location Surgery: [facility] 3 Complications: None noted  EBL:   30 cc  Drains: none  Description of Procedure:  The patient was taken to  OR 3 .  After anesthesia was administered and the patient was prepped  with chloroprep  and a timeout was performed.  Access the abdomen was achieved with a 5 mm Optiview through the left upper quadrant for 4 port number for the robot.  Once this was insufflation occurred I surveyed the abdomen found to have an acute cholecystitis with a markedly distended gallbladder.  I went ahead and placed my for trochars total exchange of 5 for another 48m robotic trocar.  The abdomen once abdomen was insufflated and the ports were and I went ahead and put in 5 mm over below the umbilicus for my assistant nurse.  The robot was brought in and the scope was placed in the telescope was targeted.  The robot was then docked and tip up was then port 1 the fenestrated bipolar and port to the camera port 3 and the hook and port 4.  First up placed a aspiration trocar into the fundus of the gallbladder and sucked out a lot of obstructed material.  This decompressed the gallbladder somewhat.  After retraction I found this 1 cm stone was impacted in the infundibulum making the Calot's triangle difficult and was markedly inflamed and scarred down.  This was a tedious dissection but eventually I did identified an artery going up on the gallbladder which I clipped and dissected and divided.  I opened up the posterior window and could see through the gallbladder and did not see any accessory structures.  So what I thought was the common bile duct.  The ICG would light up the liver but I did not see the that it  had fill the gallbladder.  So the infundibulum was dissected free and I went ahead and did a dome down and removed the gallbladder from the liver bed completely taking this down to the only thing holding it was this stump.  I went ahead and and skinny did cystic duct down and double clipped it and then transected.  Gallbladder was placed in a bag and brought out through the left sided trocar site.  I irrigated and no bleeding was noted no bile blood leaks were noted.  I had placed a tap block bilaterally with 30 cc of Exparel.  The trocar site at on the number for port was then closed with single's simple 0 Vicryl.  Wounds were irrigated and the skin were closed with 4-0 Monocryl and Dermabond.  The patient tolerated the procedure well and was taken to the PACU in stable condition.     Matt B. MHassell Done MAppling FDenton Surgery [facility]entonSurgery, PWashington

## 2021-07-23 NOTE — Anesthesia Procedure Notes (Signed)
Procedure Name: Intubation Date/Time: 07/23/2021 1:27 PM Performed by: Lavina Hamman, CRNA Pre-anesthesia Checklist: Patient identified, Emergency Drugs available, Suction available, Patient being monitored and Timeout performed Patient Re-evaluated:Patient Re-evaluated prior to induction Oxygen Delivery Method: Circle system utilized Preoxygenation: Pre-oxygenation with 100% oxygen Induction Type: IV induction Ventilation: Mask ventilation without difficulty Laryngoscope Size: Mac and 3 Grade View: Grade I Tube type: Oral Tube size: 7.0 mm Number of attempts: 1 Airway Equipment and Method: Stylet Placement Confirmation: ETT inserted through vocal cords under direct vision, positive ETCO2, CO2 detector and breath sounds checked- equal and bilateral Secured at: 21 cm Tube secured with: Tape Dental Injury: Teeth and Oropharynx as per pre-operative assessment  Comments: ATOI

## 2021-07-23 NOTE — Discharge Instructions (Signed)
CIRUGIA LAPAROSCOPICA: INSTRUCCIONES DE POST OPERATORIO.  Revise siempre los documentos que le entreguen en el lugar donde se ha hecho la [place].  SI USTED NECESITA DOCUMENTOS DE INCAPACIDAD (DISABLE) O DE PERMISO FAMILAR (FAMILY LEAVE) NECESITA TRAERLOS A LA OFICINA PARA QUE SEAN PROCESADOS. NO  SE LOS DE A SU DOCTOR. A su alta del hospital se le dara una receta para Financial controller. Tomela como ha sido recetada, si la necesita. Si no la necesita puede tomar, Acetaminofen (Tylenol) o Ibuprofen (Advil) para aliviar dolor moderado. Continue tomando el resto de sus medicinas. Si necesita rellenar la receta, llame a la farmacia. ellos contactan a nuestra oficina pidiendo autorizacion. Este tipo de receta no pueden ser [employer] de las  5pm o [employer] fines de [place]. Con relacion a la dieta: debe ser [employer] primeros dias despues que llege a la casa. Ejemplo: sopas y galleticas. Tome bastante liquido esos dias. La [employer] de los pacientes padecen de inflamacion y cambio de coloracion de la piel alrededor de las incisiones. esto toma dias en resolver.  pnerse una bolsa de hielo en el area affectada ayuda..  Es comun tambien tener un poco de estrenimiento si esta tomado medicinas para Conservation officer, historic buildings. incremente la cantidad de liquidos a tomar y Doctor, hospital (Colace) esto previene el problema. Si ya tiene estrenimiento, es Software engineer no ha defecado en 48 horas, puede tomar un laxativo (Milk of Magnesia or Miralax) uselo como el paquete le explica.  A menos que se le diga algo diferente. Remueva el bendaje a las 24-48 horas despues dela [place]. y puede banarse en la ducha sin ningun problema. usted puede tener steri-strips (pequenas curitas transparentes en la piel puesta encima de la incision)  Estas banditas strips should be left on the skin for 7-10 days.   Si su cirujano puso pegamento encima de la incision usted puede banarse bajo la ducha en 24 horas. Este pegamento empezara a caerse en las  proximas 2-3 semanas. Si le pusieron suturas o presillas (grapos) estos seran quitados en su proxima cita en la oficina. . ACTIVIDADES:  Puede hacer actividad ligera.  Como caminar , subir escaleras y poco a poco irlas incrementando tanto como las [place]. Puede tener relaciones sexuales cuando sea comfortable. No carge objetos pesados o haga esfuerzos que no sean aprovados por su doctor. Puede manejar en cuanto no esta tomando medicamentos fuertes (narcoticos) para Conservation officer, historic buildings, pueda abrochar confortablemente el cinturon de seguridad, y pueda Psychologist, counselling y usar los pedales de su vehiculo con seguridad.  Debe ver a su doctor para una cita de seguimiento en 2-3 semanas despues de la [place].   CUANDO LLAMAR A SU MEDICO: FIEBRE mayor de  101.0 No produccion de [place]. Sangramiento continue de la herida Incremento de Social research officer, government, enrojecimientio o drenaje de la herida (incision) Incremento de dolor abdominal.  The clinic staff is available to answer your questions during regular business hours.  Please don't hesitate to call and ask to speak to one of the nurses for clinical concerns.  If you have a medical emergency, go to the nearest emergency room or call 911.  A surgeon from [facility] is always on call at the hospital. [address], [place], [place], [place]  [address] ? [address], [place], [place]   [address] [phone] ? [phone] ? FAX [phone] Web site: [email or web address]

## 2021-07-23 NOTE — Anesthesia Postprocedure Evaluation (Signed)
Anesthesia Post Note  Patient: Michele Arnold  Procedure(s) Performed: XI ROBOTIC ASSISTED LAPAROSCOPIC CHOLECYSTECTOMY (Abdomen)     Patient location during evaluation: PACU Anesthesia Type: General Level of consciousness: awake and alert Pain management: pain level controlled Vital Signs Assessment: post-procedure vital signs reviewed and stable Respiratory status: spontaneous breathing, nonlabored ventilation and respiratory function stable Cardiovascular status: blood pressure returned to baseline and stable Postop Assessment: no apparent nausea or vomiting Anesthetic complications: no   No notable events documented.  Last Vitals:  Vitals:   07/23/21 1630 07/23/21 1703  BP: 128/67 (!) 143/66  Pulse: 84 78  Resp: 16 19  Temp:  36.5 C  SpO2: 93% 96%    Last Pain:  Vitals:   07/23/21 1722  TempSrc:   PainSc: [place]

## 2021-07-23 NOTE — Progress Notes (Signed)
Day of Surgery    CC: Abdominal pain  Subjective: She is up and prepping for surgery.  She has discussed surgery with Dr. Martin  Objective: Vital signs in last 24 hours: Temp:  [97.9 F (36.6 C)-98.5 F (36.9 C)] 97.9 F (36.6 C) (09/08 0517) Pulse Rate:  [61-82] 82 (09/08 0517) Resp:  [18-20] 20 (09/08 0517) BP: (115-159)/(46-66) 115/46 (09/08 0517) SpO2:  [93 %-97 %] 97 % (09/08 0517) Last BM Date: 07/20/21 Nothing p.o. recorded BM x1 recorded No other intake/output recorded Afebrile vital signs are stable Labs are stable/improving: Alk phos 132, AST 111, ALT 265, total bilirubin 1.5   Intake/Output from previous day: No intake/output data recorded. Intake/Output this shift: No intake/output data recorded.  General appearance: alert, cooperative, no distress, and getting ready for surgery.  Lab Results:  Recent Labs    07/22/21 0511 07/23/21 0453  WBC 5.9 6.2  HGB 12.8 12.8  HCT 38.3 38.1  PLT 355 351    BMET Recent Labs    07/22/21 0511 07/23/21 0453  NA 142 143  K 3.8 3.9  CL 114* 113*  CO2 23 22  GLUCOSE 98 99  BUN 11 10  CREATININE 0.74 0.67  CALCIUM 9.0 9.1   PT/INR Recent Labs    07/22/21 0511  LABPROT 12.4  INR 0.9    Recent Labs  Lab 07/21/21 0531 07/22/21 0511 07/23/21 0453  AST 1,006* 336* 111*  ALT 530* 423* 265*  ALKPHOS 138* 155* 132*  BILITOT 3.4* 3.5* 1.5*  PROT 8.0 7.1 6.9  ALBUMIN 4.3 3.7 3.6     Lipase     Component Value Date/Time   LIPASE 33 07/21/2021 0531     Medications:  indocyanine green  2.5 mg Intravenous To SSTC   lisinopril  10 mg Oral Daily    Assessment/Plan Acute cholecystitis  Possible choledocholithiasis - cholelithiasis noted on imaging and US/CT were not convincing for cholecystitis - MRCP suggestive of possible cholecystitis and negative for choledocholithiasis - HIDA positive for cystic duct occlusion consistent with cholecystitis - no leukocytosis and afebrile, but recommend IV  abx given positive HIDA and pain on exam  - LFTs trending down but Tbili stable around 3.5 - Hep panel negative and CMV negative; EBV, ANA, HSV and some other labs still pending  - recommend repeating LFTs in AM - if Tbili <4.0 then will likely plan for laparoscopic cholecystectomy with IOC tomorrow. If Tbili > 4.0 tomorrow would recommend consideration of ERCP per ACS guidelines   FEN: ok to have CLD this evening but will make NPO after MN, IVF per [facility] VTE: SCDs ID: Rocephin ordered        LOS: 2 days    Michele Arnold 07/23/2021 Please see Amion  

## 2021-07-23 NOTE — Progress Notes (Signed)
Patient received back on unit from PACU. C/o 7/10 pain and nausea. PRN pain and nausea medication given.

## 2021-07-23 NOTE — H&P (View-Only) (Signed)
Day of Surgery    CC: Abdominal pain  Subjective: She is up and prepping for surgery.  She has discussed surgery with Dr. Martin  Objective: Vital signs in last 24 hours: Temp:  [97.9 F (36.6 C)-98.5 F (36.9 C)] 97.9 F (36.6 C) (09/08 0517) Pulse Rate:  [61-82] 82 (09/08 0517) Resp:  [18-20] 20 (09/08 0517) BP: (115-159)/(46-66) 115/46 (09/08 0517) SpO2:  [93 %-97 %] 97 % (09/08 0517) Last BM Date: 07/20/21 Nothing p.o. recorded BM x1 recorded No other intake/output recorded Afebrile vital signs are stable Labs are stable/improving: Alk phos 132, AST 111, ALT 265, total bilirubin 1.5   Intake/Output from previous day: No intake/output data recorded. Intake/Output this shift: No intake/output data recorded.  General appearance: alert, cooperative, no distress, and getting ready for surgery.  Lab Results:  Recent Labs    07/22/21 0511 07/23/21 0453  WBC 5.9 6.2  HGB 12.8 12.8  HCT 38.3 38.1  PLT 355 351    BMET Recent Labs    07/22/21 0511 07/23/21 0453  NA 142 143  K 3.8 3.9  CL 114* 113*  CO2 23 22  GLUCOSE 98 99  BUN 11 10  CREATININE 0.74 0.67  CALCIUM 9.0 9.1   PT/INR Recent Labs    07/22/21 0511  LABPROT 12.4  INR 0.9    Recent Labs  Lab 07/21/21 0531 07/22/21 0511 07/23/21 0453  AST 1,006* 336* 111*  ALT 530* 423* 265*  ALKPHOS 138* 155* 132*  BILITOT 3.4* 3.5* 1.5*  PROT 8.0 7.1 6.9  ALBUMIN 4.3 3.7 3.6     Lipase     Component Value Date/Time   LIPASE 33 07/21/2021 0531     Medications:  indocyanine green  2.5 mg Intravenous To SSTC   lisinopril  10 mg Oral Daily    Assessment/Plan Acute cholecystitis  Possible choledocholithiasis - cholelithiasis noted on imaging and US/CT were not convincing for cholecystitis - MRCP suggestive of possible cholecystitis and negative for choledocholithiasis - HIDA positive for cystic duct occlusion consistent with cholecystitis - no leukocytosis and afebrile, but recommend IV  abx given positive HIDA and pain on exam  - LFTs trending down but Tbili stable around 3.5 - Hep panel negative and CMV negative; EBV, ANA, HSV and some other labs still pending  - recommend repeating LFTs in AM - if Tbili <4.0 then will likely plan for laparoscopic cholecystectomy with IOC tomorrow. If Tbili > 4.0 tomorrow would recommend consideration of ERCP per ACS guidelines   FEN: ok to have CLD this evening but will make NPO after MN, IVF per TRH VTE: SCDs ID: Rocephin ordered        LOS: 2 days    JENNINGS,WILLARD 07/23/2021 Please see Amion  

## 2021-07-23 NOTE — Interval H&P Note (Signed)
History and Physical Interval Note:  07/23/2021 11:58 AM  Michele Arnold  has presented today for surgery, with the diagnosis of CHOLECYSTITIS.  The various methods of treatment have been discussed with the patient and family. After consideration of risks, benefits and other options for treatment, the patient has consented to  Procedure(s): XI ROBOTIC [place] (N/A) as a surgical intervention.  The patient's history has been reviewed, patient examined, no change in status, stable for surgery.  I have reviewed the patient's chart and labs.  Questions were answered to the patient's satisfaction.     Pedro Earls

## 2021-07-23 NOTE — Anesthesia Preprocedure Evaluation (Signed)
Anesthesia Evaluation  Patient identified by MRN, date of birth, ID band Patient awake    Reviewed: Allergy & Precautions, NPO status , Patient's Chart, lab work & pertinent test results  Airway Mallampati: II  TM Distance: >3 FB Neck ROM: Full    Dental no notable dental hx. (+) Teeth Intact, Dental Advisory Given   Pulmonary neg pulmonary ROS,    Pulmonary exam normal breath sounds clear to auscultation       Cardiovascular negative cardio ROS Normal cardiovascular exam Rhythm:Regular Rate:Normal     Neuro/Psych negative neurological ROS  negative psych ROS   GI/Hepatic Elevated LFT's Cholelithiasis with cholecystitis   Endo/Other  Obesity  Renal/GU negative Renal ROS  negative genitourinary   Musculoskeletal negative musculoskeletal ROS (+)   Abdominal (+) + obese,   Peds  Hematology negative hematology ROS (+)   Anesthesia Other Findings   Reproductive/Obstetrics                             Anesthesia Physical Anesthesia Plan  ASA: 2  Anesthesia Plan: General   Post-op Pain Management:    Induction: Intravenous and Cricoid pressure planned  PONV Risk Score and Plan: 4 or greater and Treatment may vary due to age or medical condition, Scopolamine patch - Pre-op, Midazolam, Ondansetron and Dexamethasone  Airway Management Planned: Oral ETT  Additional Equipment: None  Intra-op Plan:   Post-operative Plan: Extubation in OR  Informed Consent: I have reviewed the patients History and Physical, chart, labs and discussed the procedure including the risks, benefits and alternatives for the proposed anesthesia with the patient or authorized representative who has indicated his/her understanding and acceptance.     Dental advisory given  Plan Discussed with: CRNA and Anesthesiologist  Anesthesia Plan Comments:         Anesthesia Quick Evaluation

## 2021-07-23 NOTE — Progress Notes (Addendum)
[facility] Progress Note  Michele Arnold 60 y.o. 10/21/1961  CC:  Abnormal LFTs, acute cholecystitis  Subjective: Patient reports feeling better, though still has some right-sided pain. No nausea/vomiting.    [employer] video interpreting services utilized for this encounter [name] [phone]).  ROS : Review of Systems  Cardiovascular:  Negative for chest pain and palpitations.  Gastrointestinal:  Negative for abdominal pain, blood in stool, constipation, diarrhea, heartburn, melena, nausea and vomiting.     Objective: Vital signs in last 24 hours: Vitals:   07/22/21 2008 07/23/21 0517  BP: 127/61 (!) 115/46  Pulse: 61 82  Resp: 20 20  Temp: 98.5 F (36.9 C) 97.9 F (36.6 C)  SpO2: 93% 97%    Physical Exam:  General:  Alert, cooperative, no distress  Head:  Normocephalic, without obvious abnormality, atraumatic  Eyes:  Anicteric sclera, EOMs intact  Lungs:   Clear to auscultation bilaterally, respirations unlabored  Heart:  Regular rate and rhythm, S1, S2 normal  Abdomen:   Soft and non distended with mild epigastric and RUQ tenderness to palpation, normoactive bowel sounds   Extremities: Extremities normal, atraumatic, no  edema    Lab Results: Recent Labs    07/21/21 1723 07/22/21 0511 07/23/21 0453  NA  --  142 143  K  --  3.8 3.9  CL  --  114* 113*  CO2  --  23 22  GLUCOSE  --  98 99  BUN  --  11 10  CREATININE  --  0.74 0.67  CALCIUM  --  9.0 9.1  MG 2.3  --   --    Recent Labs    07/22/21 0511 07/23/21 0453  AST 336* 111*  ALT 423* 265*  ALKPHOS 155* 132*  BILITOT 3.5* 1.5*  PROT 7.1 6.9  ALBUMIN 3.7 3.6   Recent Labs    07/21/21 0532 07/22/21 0511 07/23/21 0453  WBC 6.1 5.9 6.2  NEUTROABS 3.4  --   --   HGB 14.3 12.8 12.8  HCT 43.2 38.3 38.1  MCV 95.6 96.2 96.7  PLT 446* 355 351   Recent Labs    07/22/21 0511  LABPROT 12.4  INR 0.9      Assessment/Plan: Acute cholecystitis, undergoing surgery today -HIDA  9/7 consistent with acute cholecystitis  Abnormal LFTs, abdominal pain, likely related to cholecystitis. It's possible she passed a CBD stone as well. -MRI/MRCP showed no biliary dilation or CBD stones.  Cholelithiasis with dilation of gallbladder and trace pericholecystic fluid, equivocal for cholecystitis. -T. Bili 1.5, improved from 3.5 yesterday -AST 111/ ALT 265/ ALP 132, down-trending -CMV, EBV, CMV IgM negative -Normal PT/INR  -Negative acute hepatitis panel -No leukocytosis -Normal platelets 351K/uL -Acetaminophen level <10 -Normal lipase -ANA, AMA, ASMA, alpha-1 anti-trypsin, and ceruloplasmin normal -Elevated ferritin (461), iron (197) and iron saturation (52), which could be reactive, but recommend proceeding with hemochromatosis DNA testing -Hemachromatosis DNA pending    Plan: Patient undergoing cholecystectomy today.  Further management as per surgical team.  Trend LFTs to normalization as an outpatient.  [facility] will sign off. Please contact us if we can be of any further assistance during this Arnold stay.   [name] PA-C 07/23/2021, 10:32 AM  Contact #  [phone]

## 2021-07-24 DIAGNOSIS — K8080 Other cholelithiasis without obstruction: Secondary | ICD-10-CM

## 2021-07-24 LAB — COMPREHENSIVE METABOLIC PANEL WITH GFR
ALT: 213 U/L — ABNORMAL HIGH (ref 0–44)
AST: 107 U/L — ABNORMAL HIGH (ref 15–41)
Albumin: 3.7 g/dL (ref 3.5–5.0)
Alkaline Phosphatase: 118 U/L (ref 38–126)
Anion gap: 7 (ref 5–15)
BUN: 11 mg/dL (ref 6–20)
CO2: 23 mmol/L (ref 22–32)
Calcium: 8.8 mg/dL — ABNORMAL LOW (ref 8.9–10.3)
Chloride: 108 mmol/L (ref 98–111)
Creatinine, Ser: 0.7 mg/dL (ref 0.44–1.00)
GFR, Estimated: >60 mL/min
Glucose, Bld: 137 mg/dL — ABNORMAL HIGH (ref 70–99)
Potassium: 3.9 mmol/L (ref 3.5–5.1)
Sodium: 138 mmol/L (ref 135–145)
Total Bilirubin: 1.1 mg/dL (ref 0.3–1.2)
Total Protein: 7.1 g/dL (ref 6.5–8.1)

## 2021-07-24 LAB — CBC
HCT: 36.9 % (ref 36.0–46.0)
Hemoglobin: 12.2 g/dL (ref 12.0–15.0)
MCH: 32.1 pg (ref 26.0–34.0)
MCHC: 33.1 g/dL (ref 30.0–36.0)
MCV: 97.1 fL (ref 80.0–100.0)
Platelets: 357 K/uL (ref 150–400)
RBC: 3.8 MIL/uL — ABNORMAL LOW (ref 3.87–5.11)
RDW: 12.9 % (ref 11.5–15.5)
WBC: 8.8 K/uL (ref 4.0–10.5)
nRBC: 0 % (ref 0.0–0.2)

## 2021-07-24 MED ORDER — ACETAMINOPHEN 325 MG PO TABS: 650.0000 mg | ORAL_TABLET | Freq: Four times a day (QID) | ORAL | Status: DC | PRN

## 2021-07-24 MED ORDER — OXYCODONE HCL 5 MG PO TABS: 5.0000 mg | ORAL_TABLET | Freq: Four times a day (QID) | ORAL | 0 refills | Status: DC | PRN

## 2021-07-24 MED ORDER — SODIUM CHLORIDE 0.9% FLUSH: 3.0000 mL | Freq: Two times a day (BID) | INTRAVENOUS | Status: DC

## 2021-07-24 MED ORDER — LISINOPRIL 10 MG PO TABS: 10.0000 mg | ORAL_TABLET | Freq: Every day | ORAL | 0 refills | Status: DC

## 2021-07-24 MED ORDER — SODIUM CHLORIDE 0.9% FLUSH
3.0000 mL | INTRAVENOUS | Status: DC | PRN
Start: 2021-07-24 — End: 2021-07-24

## 2021-07-24 MED ORDER — SODIUM CHLORIDE 0.9 % IV SOLN: 250.0000 mL | INTRAVENOUS | Status: DC | PRN

## 2021-07-24 NOTE — TOC Transition Note (Signed)
Transition of Care [facility]) - CM/SW Discharge Note   Patient Details  Name: Michele Arnold MRN: [id number] Date of Birth: [birth year]  Transition of Care [facility]) CM/SW Contact:  Trish Mage, LCSW Phone Number: 07/24/2021, 11:08 AM   Clinical Narrative:   Alerted by MD that patient is in need of rolling walker.  Order seen and appreciated.  [place] who will deliver charity walker to patient's room.  No further needs identified.  TOC sign off.    Final next level of care: Home/Self Care Barriers to Discharge: No Barriers Identified   Patient Goals and CMS Choice        Discharge Placement                       Discharge Plan and Services                                     Social Determinants of Health (SDOH) Interventions     Readmission Risk Interventions No flowsheet data found.

## 2021-07-24 NOTE — Progress Notes (Signed)
Progress Note  1 Day Post-Op  Subjective: Patient reports some mild soreness. Denies n/v. Tolerating liquids and passing flatus.   Objective: Vital signs in last 24 hours: Temp:  [97.6 F (36.4 C)-98.5 F (36.9 C)] 98.5 F (36.9 C) (09/09 0453) Pulse Rate:  [65-89] 65 (09/09 0453) Resp:  [16-19] 17 (09/09 0453) BP: (116-144)/(63-67) 144/64 (09/09 0453) SpO2:  [92 %-98 %] 98 % (09/09 0453) Last BM Date: 07/20/21  Intake/Output from previous day: 09/08 0701 - 09/09 0700 In: 2904.7 [I.V.:2904.7] Out: 50 [Blood:50] Intake/Output this shift: No intake/output data recorded.  PE: General: pleasant, WD, overweight female who is laying in bed in NAD Heart: regular, rate, and rhythm.   Lungs: CTAB, no wheezes, rhonchi, or rales noted.  Respiratory effort nonlabored Abd: soft, NT, ND, +BS, incisions c/d/I  MS: all 4 extremities are symmetrical with no cyanosis, clubbing, or edema. Skin: warm and dry with no masses, lesions, or rashes Neuro: Cranial nerves 2-12 grossly intact, sensation is normal throughout Psych: A&Ox3 with an appropriate affect.    Lab Results:  Recent Labs    07/23/21 0453 07/24/21 0502  WBC 6.2 8.8  HGB 12.8 12.2  HCT 38.1 36.9  PLT 351 357   BMET Recent Labs    07/23/21 0453 07/24/21 0502  NA 143 138  K 3.9 3.9  CL 113* 108  CO2 22 23  GLUCOSE 99 137*  BUN 10 11  CREATININE 0.67 0.70  CALCIUM 9.1 8.8*   PT/INR Recent Labs    07/22/21 0511  LABPROT 12.4  INR 0.9   CMP     Component Value Date/Time   NA 138 07/24/2021 0502   K 3.9 07/24/2021 0502   CL 108 07/24/2021 0502   CO2 23 07/24/2021 0502   GLUCOSE 137 (H) 07/24/2021 0502   BUN 11 07/24/2021 0502   CREATININE 0.70 07/24/2021 0502   CALCIUM 8.8 (L) 07/24/2021 0502   PROT 7.1 07/24/2021 0502   ALBUMIN 3.7 07/24/2021 0502   AST 107 (H) 07/24/2021 0502   ALT 213 (H) 07/24/2021 0502   ALKPHOS 118 07/24/2021 0502   BILITOT 1.1 07/24/2021 0502   GFRNONAA >60 07/24/2021  0502   Lipase     Component Value Date/Time   LIPASE 33 07/21/2021 0531       Studies/Results: NM Hepatobiliary Liver Func  Result Date: 07/22/2021 CLINICAL DATA:  Epigastric pain, abnormal imaging of the gallbladder EXAM: NUCLEAR MEDICINE HEPATOBILIARY IMAGING TECHNIQUE: Sequential images of the abdomen were obtained out to 60 minutes following intravenous administration of radiopharmaceutical. Additional 30 minutes of scintigraphic imaging was obtained post administration of 3 mg of morphine. RADIOPHARMACEUTICALS:  7.6 mCi Tc-32m Choletec IV COMPARISON:  MRI, CT and ultrasound July 21, 2021 FINDINGS: Prompt uptake and biliary excretion of activity by the liver is seen. Gallbladder activity is not visualized before or after the administration of morphine. Additionally, there is a subtle rim of increased radiotracer activity in the hepatic parenchyma along the gallbladder fossa. Biliary activity passes into small bowel, consistent with patent common bile duct. IMPRESSION: Scintigraphic findings most consistent with acute cholecystitis. These results will be called to the ordering clinician or representative by the Radiologist Assistant, and communication documented in the PACS or [employer] Electronically Signed   By: JDahlia BailiffM.D.   On: 07/22/2021 15:22    Anti-infectives: Anti-infectives (From admission, onward)    Start     Dose/Rate Route Frequency Ordered Stop   07/22/21 1800  cefTRIAXone (ROCEPHIN) 2 g  in sodium chloride 0.9 % 100 mL IVPB  Status:  Discontinued        2 g 200 mL/hr over 30 Minutes Intravenous Every 24 hours 07/22/21 1633 07/23/21 1705        Assessment/Plan Gallstone pancreatitis Acute cholecystitis  POD1 S/P robotic cholecystectomy 9/8 Dr. Hassell Done  - pt doing well post-operatively  - tolerating liquids and passing flatus - incisions look good - stable for discharge from a surgical standpoint, follow up in AVS, letter provided for work, and Rx  sent for pain medication   FEN: [place] diet  VTE: SCDs ID: rocephin 9/7>>  LOS: 3 days    Norm Parcel, [facility] Surgery 07/24/2021, 11:54 AM Please see Amion for pager number during day hours 7:00am-4:30pm

## 2021-07-24 NOTE — Discharge Summary (Signed)
Physician Discharge Summary  Michele Arnold [id number] DOB: [birth year] DOA: 07/21/2021  PCP: Pcp, No  Admit date: 07/21/2021 Discharge date: 07/24/2021  Admitted From: home Discharge disposition: home   Recommendations for Outpatient Follow-Up:   Note given by GS for work  Discharge Diagnosis:   Active Problems:   Abdominal pain    Discharge Condition: Improved.  Diet recommendation: low residue  Wound care: None.  Code status: Full.   History of Present Illness:   HPI: Michele Arnold is a 60 y.o. female with past medical history significant for HTN. She presents with upper abdominal pain. It started yesterday. Her pain was sharp in the RU/LUQs. It came in waves. It was accompanied by nausea. She was initially unable to vomit, but she gagged herself to try to provide some relief from the bloating and pain. It did not work. Her symptoms continued through this morning, so she decided to come to the ED. She denies any other aggravating or alleviating factors.    ED Course: Her LFTs were elevated. CT ab/pelvis showed gallstones. RUQ [place] was negative for obstruction, acute chole. [facility] was consulted. [facility] was called for admission.      Hospital Course by Problem:   Gallstone pancreatitis Acute cholecystitis  POD1 S/P robotic cholecystectomy 9/8 Dr. Hassell Done  - pt doing well post-operatively  - tolerating liquids and passing flatus - incisions look good - stable for discharge from a surgical standpoint, follow up in AVS, letter provided for work, and Rx sent for pain medication   HTN -resume lisinopril  Ambulated pateint   Medical Consultants:   [place]   Discharge Exam:   Vitals:   07/23/21 2204 07/24/21 0453  BP: 116/63 (!) 144/64  Pulse: 77 65  Resp: 18 17  Temp: 98.5 F (36.9 C) 98.5 F (36.9 C)  SpO2: 96% 98%   Vitals:   07/23/21 1630 07/23/21 1703 07/23/21 2204 07/24/21 0453  BP: 128/67 (!) 143/66 116/63 (!) 144/64  Pulse:  84 78 77 65  Resp: '16 19 18 17  '$ Temp:  97.7 F (36.5 C) 98.5 F (36.9 C) 98.5 F (36.9 C)  TempSrc:  Oral    SpO2: 93% 96% 96% 98%  Weight:      Height:        General exam: Appears calm and comfortable.  .    The results of significant diagnostics from this hospitalization (including imaging, microbiology, ancillary and laboratory) are listed below for reference.     Procedures and Diagnostic Studies:   NM Hepatobiliary Liver Func  Result Date: 07/22/2021 CLINICAL DATA:  Epigastric pain, abnormal imaging of the gallbladder EXAM: NUCLEAR MEDICINE HEPATOBILIARY IMAGING TECHNIQUE: Sequential images of the abdomen were obtained out to 60 minutes following intravenous administration of radiopharmaceutical. Additional 30 minutes of scintigraphic imaging was obtained post administration of 3 mg of morphine. RADIOPHARMACEUTICALS:  7.6 mCi Tc-74m Choletec IV COMPARISON:  MRI, CT and ultrasound July 21, 2021 FINDINGS: Prompt uptake and biliary excretion of activity by the liver is seen. Gallbladder activity is not visualized before or after the administration of morphine. Additionally, there is a subtle rim of increased radiotracer activity in the hepatic parenchyma along the gallbladder fossa. Biliary activity passes into small bowel, consistent with patent common bile duct. IMPRESSION: Scintigraphic findings most consistent with acute cholecystitis. These results will be called to the ordering clinician or representative by the Radiologist Assistant, and communication documented in the PACS or [employer] Electronically Signed   By: JDellis Filbert  Nance Pew M.D.   On: 07/22/2021 15:22   CT ABDOMEN PELVIS W CONTRAST  Result Date: 07/21/2021 CLINICAL DATA:  Epigastric pain. EXAM: CT ABDOMEN AND PELVIS WITH CONTRAST TECHNIQUE: Multidetector CT imaging of the abdomen and pelvis was performed using the standard protocol following bolus administration of intravenous contrast. CONTRAST:  60m OMNIPAQUE  IOHEXOL 350 MG/ML SOLN COMPARISON:  None. FINDINGS: Lower chest: No acute abnormality. Subsegmental atelectasis at the lung bases. Hepatobiliary: No focal liver abnormality. Distended gallbladder with multiple gallstones. No gallbladder wall thickening or pericholecystic inflammatory change. Fundal adenomyomatosis. No biliary dilatation. Pancreas: Unremarkable. No pancreatic ductal dilatation or surrounding inflammatory changes. Spleen: Normal in size without focal abnormality. Adrenals/Urinary Tract: The adrenal glands are unremarkable. 2.2 cm simple cyst in the upper pole of the right kidney. Few punctate left renal calculi. No hydronephrosis. Bladder is decompressed. Stomach/Bowel: Stomach is within normal limits. Appendix appears normal. No evidence of bowel wall thickening, distention, or inflammatory changes. Moderate left-sided colonic diverticulosis. Vascular/Lymphatic: Aortic atherosclerosis. No enlarged abdominal or pelvic lymph nodes. Reproductive: Uterus and bilateral adnexa are unremarkable. Other: Small fat containing umbilical hernia. No free fluid or pneumoperitoneum. Musculoskeletal: No acute or significant osseous findings. IMPRESSION: 1. Cholelithiasis with distended gallbladder. No CT evidence of acute cholecystitis. Consider further evaluation with right upper quadrant ultrasound as clinically indicated. 2. Punctate nonobstructive left nephrolithiasis. 3. Aortic Atherosclerosis (ICD10-I70.0). Electronically Signed   By: WTitus DubinM.D.   On: 07/21/2021 07:19   MR 3D Recon At Scanner  Result Date: 07/22/2021 CLINICAL DATA:  Cholelithiasis, jaundice EXAM: MRI ABDOMEN WITHOUT AND WITH CONTRAST (INCLUDING MRCP) TECHNIQUE: Multiplanar multisequence MR imaging of the abdomen was performed both before and after the administration of intravenous contrast. Heavily T2-weighted images of the biliary and pancreatic ducts were obtained, and three-dimensional MRCP images were rendered by post  processing. CONTRAST:  829mGADAVIST GADOBUTROL 1 MMOL/ML IV SOLN COMPARISON:  CT abdomen pelvis and abdominal ultrasound July 21, 2021. FINDINGS: Lower chest: Eventration of the right hemidiaphragm. No acute abnormality. Hepatobiliary: Mild loss of signal in the hepatic parenchyma on out of phase imaging, consistent with hepatic steatosis. No suspicious hepatic lesion. Periportal edema. Cholelithiasis with dilation of the gallbladder and trace pericholecystic fluid. No gallbladder wall thickening or abnormal wall enhancement. No biliary ductal dilation.  No choledocholithiasis. Pancreas: No pancreatic divisum. No evidence of acute inflammation. No pancreatic ductal dilation. No cystic or arterially enhancing pancreatic lesions. Spleen:  Unremarkable. Adrenals/Urinary Tract: Bilateral adrenal glands are unremarkable. 2.1 cm right upper pole renal cyst. No solid enhancing renal masses. Stomach/Bowel: Stomach is unremarkable. No pathologic dilation of small or large bowel in the abdomen. Colonic diverticulosis without findings of acute diverticulitis. Vascular/Lymphatic: No abdominal aortic aneurysm. The portal, splenic and superior mesenteric veins are patent. No pathologically enlarged abdominal lymph nodes. Other:  No abdominal ascites. Musculoskeletal: No suspicious bone lesions identified. Levoconvex curvature of the lumbar spine. Multilevel degenerative changes spine. IMPRESSION: 1. No biliary ductal dilation. No choledocholithiasis. 2. Mild hepatic steatosis. 3. Cholelithiasis with dilation of the gallbladder and trace pericholecystic fluid. No gallbladder wall thickening or abnormal wall enhancement. Findings which are equivocal for acute cholecystitis. Consider further evaluation with nuclear medicine HIDA scan to assess cystic duct patency if clinically indicated. Electronically Signed   By: JeDahlia Bailiff.D.   On: 07/21/2021 21:38   MR ABDOMEN MRCP W WO CONTAST  Result Date: 07/21/2021 CLINICAL  DATA:  Cholelithiasis, jaundice EXAM: MRI ABDOMEN WITHOUT AND WITH CONTRAST (INCLUDING MRCP) TECHNIQUE: Multiplanar multisequence MR imaging of the  abdomen was performed both before and after the administration of intravenous contrast. Heavily T2-weighted images of the biliary and pancreatic ducts were obtained, and three-dimensional MRCP images were rendered by post processing. CONTRAST:  48m GADAVIST GADOBUTROL 1 MMOL/ML IV SOLN COMPARISON:  CT abdomen pelvis and abdominal ultrasound July 21, 2021. FINDINGS: Lower chest: Eventration of the right hemidiaphragm. No acute abnormality. Hepatobiliary: Mild loss of signal in the hepatic parenchyma on out of phase imaging, consistent with hepatic steatosis. No suspicious hepatic lesion. Periportal edema. Cholelithiasis with dilation of the gallbladder and trace pericholecystic fluid. No gallbladder wall thickening or abnormal wall enhancement. No biliary ductal dilation.  No choledocholithiasis. Pancreas: No pancreatic divisum. No evidence of acute inflammation. No pancreatic ductal dilation. No cystic or arterially enhancing pancreatic lesions. Spleen:  Unremarkable. Adrenals/Urinary Tract: Bilateral adrenal glands are unremarkable. 2.1 cm right upper pole renal cyst. No solid enhancing renal masses. Stomach/Bowel: Stomach is unremarkable. No pathologic dilation of small or large bowel in the abdomen. Colonic diverticulosis without findings of acute diverticulitis. Vascular/Lymphatic: No abdominal aortic aneurysm. The portal, splenic and superior mesenteric veins are patent. No pathologically enlarged abdominal lymph nodes. Other:  No abdominal ascites. Musculoskeletal: No suspicious bone lesions identified. Levoconvex curvature of the lumbar spine. Multilevel degenerative changes spine. IMPRESSION: 1. No biliary ductal dilation. No choledocholithiasis. 2. Mild hepatic steatosis. 3. Cholelithiasis with dilation of the gallbladder and trace pericholecystic fluid. No  gallbladder wall thickening or abnormal wall enhancement. Findings which are equivocal for acute cholecystitis. Consider further evaluation with nuclear medicine HIDA scan to assess cystic duct patency if clinically indicated. Electronically Signed   By: JDahlia BailiffM.D.   On: 07/21/2021 21:38   [place]domen Limited RUQ (LIVER/GB)  Result Date: 07/21/2021 CLINICAL DATA:  Gallstones EXAM: ULTRASOUND ABDOMEN LIMITED RIGHT UPPER QUADRANT COMPARISON:  Same day CT abdomen/pelvis FINDINGS: Gallbladder: There are shadowing stones in the gallbladder measuring up to 1.1 cm. There is no gallbladder wall thickening or pericholecystic fluid. There was no sonographic Murphy's sign reported. Common bile duct: Diameter: 4 mm Liver: No focal lesion identified. Parenchymal echogenicity is mildly increased. Portal vein is patent on color Doppler imaging with normal direction of blood flow towards the liver. Other: None. IMPRESSION: 1. Cholelithiasis without evidence of acute cholecystitis. 2. Mild hepatic steatosis. Electronically Signed   By: PValetta MoleM.D.   On: 07/21/2021 08:23     Labs:   Basic Metabolic Panel: Recent Labs  Lab 07/21/21 0531 07/21/21 1723 07/22/21 0511 07/23/21 0453 07/24/21 0502  NA 137  --  142 143 138  K 3.4*  --  3.8 3.9 3.9  CL 102  --  114* 113* 108  CO2 25  --  '23 22 23  '$ GLUCOSE 179*  --  98 99 137*  BUN 14  --  '11 10 11  '$ CREATININE 0.61  --  0.74 0.67 0.70  CALCIUM 9.5  --  9.0 9.1 8.8*  MG  --  2.3  --   --   --    GFR Estimated Creatinine Clearance: 72.8 mL/min (by C-G formula based on SCr of 0.7 mg/dL). Liver Function Tests: Recent Labs  Lab 07/21/21 0531 07/22/21 0511 07/23/21 0453 07/24/21 0502  AST 1,006* 336* 111* 107*  ALT 530* 423* 265* 213*  ALKPHOS 138* 155* 132* 118  BILITOT 3.4* 3.5* 1.5* 1.1  PROT 8.0 7.1 6.9 7.1  ALBUMIN 4.3 3.7 3.6 3.7   Recent Labs  Lab 07/21/21 0531  LIPASE 33   No results for  input(s): AMMONIA in the last 168  hours. Coagulation profile Recent Labs  Lab 07/22/21 0511  INR 0.9    CBC: Recent Labs  Lab 07/21/21 0532 07/22/21 0511 07/23/21 0453 07/24/21 0502  WBC 6.1 5.9 6.2 8.8  NEUTROABS 3.4  --   --   --   HGB 14.3 12.8 12.8 12.2  HCT 43.2 38.3 38.1 36.9  MCV 95.6 96.2 96.7 97.1  PLT 446* 355 351 357   Cardiac Enzymes: No results for input(s): CKTOTAL, CKMB, CKMBINDEX, TROPONINI in the last 168 hours. BNP: Invalid input(s): POCBNP CBG: No results for input(s): GLUCAP in the last 168 hours. D-Dimer No results for input(s): DDIMER in the last 72 hours. Hgb A1c Recent Labs    07/21/21 1723  HGBA1C 5.6   Lipid Profile No results for input(s): CHOL, HDL, LDLCALC, TRIG, CHOLHDL, LDLDIRECT in the last 72 hours. Thyroid function studies No results for input(s): TSH, T4TOTAL, T3FREE, THYROIDAB in the last 72 hours.  Invalid input(s): FREET3 Anemia work up Recent Labs    07/21/21 1355  FERRITIN 461*  TIBC 378  IRON 197*   Microbiology Recent Results (from the past 240 hour(s))  Resp Panel by RT-PCR (Flu A&B, Covid) Nasopharyngeal Swab     Status: None   Collection Time: 07/21/21  6:36 AM   Specimen: Nasopharyngeal Swab; Nasopharyngeal(NP) swabs in vial transport medium  Result Value Ref Range Status   SARS Coronavirus 2 by RT PCR NEGATIVE NEGATIVE Final    Comment: (NOTE) SARS-CoV-2 target nucleic acids are NOT DETECTED.  The SARS-CoV-2 RNA is generally detectable in upper respiratory specimens during the acute phase of infection. The lowest concentration of SARS-CoV-2 viral copies this assay can detect is 138 copies/mL. A negative result does not preclude SARS-Cov-2 infection and should not be used as the sole basis for treatment or other patient management decisions. A negative result may occur with  improper specimen collection/handling, submission of specimen other than nasopharyngeal swab, presence of viral mutation(s) within the areas targeted by this assay,  and inadequate number of viral copies(<138 copies/mL). A negative result must be combined with clinical observations, patient history, and epidemiological information. The expected result is Negative.  Fact Sheet for Patients:  EntrepreneurPulse.com.au  Fact Sheet for Healthcare Providers:  IncredibleEmployment.be  This test is no t yet approved or cleared by the [place] FDA and  has been authorized for detection and/or diagnosis of SARS-CoV-2 by FDA under an Emergency Use Authorization (EUA). This EUA will remain  in effect (meaning this test can be used) for the duration of the COVID-19 declaration under Section 564(b)(1) of the Act, 21 U.S.C.section 360bbb-3(b)(1), unless the authorization is terminated  or revoked sooner.       Influenza A by PCR NEGATIVE NEGATIVE Final   Influenza B by PCR NEGATIVE NEGATIVE Final    Comment: (NOTE) The Xpert Xpress SARS-CoV-2/FLU/RSV plus assay is intended as an aid in the diagnosis of influenza from Nasopharyngeal swab specimens and should not be used as a sole basis for treatment. Nasal washings and aspirates are unacceptable for Xpert Xpress SARS-CoV-2/FLU/RSV testing.  Fact Sheet for Patients: EntrepreneurPulse.com.au  Fact Sheet for Healthcare Providers: IncredibleEmployment.be  This test is not yet approved or cleared by the [place] FDA and has been authorized for detection and/or diagnosis of SARS-CoV-2 by FDA under an Emergency Use Authorization (EUA). This EUA will remain in effect (meaning this test can be used) for the duration of the COVID-19 declaration under Section 564(b)(1) of the Act,  21 U.S.C. section 360bbb-3(b)(1), unless the authorization is terminated or revoked.  Performed at [facility], [place] [address], [place], [place] [address]      Discharge Instructions:   Discharge Instructions     Diet - low  sodium heart healthy   Complete by: As directed    Increase activity slowly   Complete by: As directed       Allergies as of 07/24/2021   No Known Allergies      Medication List     STOP taking these medications    aspirin-sod bicarb-citric acid 325 MG Tbef tablet Commonly known as: ALKA-SELTZER   bismuth subsalicylate [id number] suspension Commonly known as: PEPTO BISMOL   calcium carbonate 500 MG chewable tablet Commonly known as: TUMS - dosed in mg elemental calcium   hydrochlorothiazide 12.5 MG tablet Commonly known as: HYDRODIURIL   tiZANidine 2 MG tablet Commonly known as: ZANAFLEX       TAKE these medications    acetaminophen 325 MG tablet Commonly known as: TYLENOL Take 2 tablets (650 mg total) by mouth every 6 (six) hours as needed for mild pain or fever.   ibuprofen 200 MG tablet Commonly known as: ADVIL Take 800 mg by mouth every 8 (eight) hours as needed for mild pain.   lisinopril 10 MG tablet Commonly known as: ZESTRIL Take 1 tablet (10 mg total) by mouth daily.   oxyCODONE 5 MG immediate release tablet Commonly known as: Oxy IR/ROXICODONE Take 1 tablet (5 mg total) by mouth every 6 (six) hours as needed for moderate pain or severe pain (pain not relieved by Tylenol, ibuprofen).               Durable Medical Equipment  (From admission, onward)           Start     Ordered   07/24/21 1037  For home use only DME Walker rolling  Once       Question Answer Comment  Walker: With [employer] Whe[employer]   Patient needs a walker to treat with the following condition Unsteady      07/24/21 [facility] [place]. Go on 08/12/2021.   Specialty: General Surgery Why: 9:45 AM. 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 Contact information: [place] [address] [phone]                  Time coordinating discharge: 35  min  Signed:  Geradine Girt DO  [facility] 07/24/2021, 12:28 PM

## 2021-07-27 LAB — SURGICAL PATHOLOGY

## 2021-07-28 LAB — HEMOCHROMATOSIS DNA-PCR(C282Y,H63D)

## 2021-08-12 ENCOUNTER — Ambulatory Visit: Payer: Self-pay | Admitting: Surgery

## 2021-08-12 NOTE — H&P (View-Only) (Signed)
History of Present Illness: Michele Arnold is a 60 y.o. 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  She recently underwent a robotic-assisted cholecystectomy with Dr. Hassell Done 07/23/2021 for acute cholecystitis.  Intraoperatively she had a stone impacted in the infundibulum of the gallbladder, ICG was given, and it appeared that she had a short cystic duct draining into an accessory right hepatic duct.  Her final surgical pathology showed invasive adenocarcinoma with a positive cystic duct margin.  Postoperative course was unremarkable and she was discharged home on postop day 1.  She was referred to me after her path showed gallbladder cancer.  Preoperatively her LFTs were elevated and her bilirubin was 3.5, however all labs downtrended prior to discharge.   She is here today by herself, but her daughter was present via phone and served as an Astronomer.  Since her surgery the patient has overall been recovering well.  She denies any issues with her incisions.  She initially had some back pain after surgery but has not had any further abdominal pain.  The back pain has resolved.  She is eating without difficulty.  She was not aware of her pathology results prior to today.   Surgical Pathology:   FINAL MICROSCOPIC DIAGNOSIS:  A. GALLBLADDER, CHOLECYSTECTOMY:  - Invasive poorly differentiated adenocarcinoma, 4.3 cm, involving  gallbladder neck  - Carcinoma invades perimuscular soft tissue  - Cystic duct margin is focally positive for carcinoma  - Focally suspicious for lymphovascular invasion  - See oncology table Pathology:   ONCOLOGY TABLE:    GALLBLADDER, CARCINOMA: Resection    Procedure: Cholecystectomy  Tumor Site: Gallbladder neck  Tumor Size: 4.3 cm  Histologic Type: Adenocarcinoma  Histologic Grade: G3: Poorly differentiated  Tumor Extension: Tumor invades perimuscular connective tissue on the  peritoneal side without serosal  involvement  Margins:       Margin Status for Invasive Carcinoma: Cystic duct margin is  positive for carcinoma  Regional Lymph Nodes: Not applicable (no lymph nodes submitted or found)  Distant Metastasis:       Distant Site(s) Involved: Not applicable  Pathologic Stage Classification (pTNM, AJCC 8th Edition): pT2a, pN not  assigned      Review of Systems: A complete review of systems was obtained from the patient.  I have reviewed this information and discussed as appropriate with the patient.  See HPI as well for other ROS.     Medical History: Past Medical History Past Medical History: Diagnosis Date  Hypertension        Patient Active Problem List Diagnosis  Gallbladder malignant neoplasm (CMS-HCC)     Past Surgical History Past Surgical History: Procedure Laterality Date  CESAREAN SECTION      LAPAROSCOPIC CHOLECYSTECTOMY          Allergies No Known Allergies    Current Outpatient Medications on File Prior to Visit Medication Sig Dispense Refill  lisinopriL (ZESTRIL) 10 MG tablet Take 1 tablet by mouth once daily      hydroCHLOROthiazide (MICROZIDE) 12.5 mg capsule Take 12.5 mg by mouth once daily       No current facility-administered medications on file prior to visit.     Family History Family History Problem Relation Age of Onset  Diabetes Sister        Social History   Tobacco Use Smoking Status Never Smoker Smokeless Tobacco Never Used     Social History Social History    Socioeconomic History  Marital status: Single Tobacco Use  Smoking status: Never Smoker  Smokeless tobacco: Never Used Vaping Use  Vaping Use: Never used Substance and Sexual Activity  Alcohol use: Never  Drug use: Never      Objective:     Vitals:   08/12/21 1339 BP: 98/60 Pulse: 90 Temp: 36.7 C (98 F) SpO2: 97% Weight: 73.8 kg (162 lb 12.8 oz) Height: 152.4 cm (5')   Body mass index is 31.79 kg/m.   Physical Exam Vitals reviewed.   Constitutional:      General: She is not in acute distress.    Appearance: Normal appearance.  HENT:     Head: Normocephalic and atraumatic.  Eyes:     General: No scleral icterus.    Conjunctiva/sclera: Conjunctivae normal.  Pulmonary:     Effort: Pulmonary effort is normal. No respiratory distress.  Abdominal:     General: There is no distension.     Palpations: Abdomen is soft.     Tenderness: There is no abdominal tenderness.     Comments: Port site incisions are healing well with no erythema, induration or drainage.  Musculoskeletal:     Cervical back: Normal range of motion.  Neurological:     Mental Status: She is alert.       Labs, Imaging and Diagnostic Testing: CT abd/pelvis 07/21/21: IMPRESSION: 1. Cholelithiasis with distended gallbladder. No CT evidence of acute cholecystitis. Consider further evaluation with right upper quadrant ultrasound as clinically indicated. 2. Punctate nonobstructive left nephrolithiasis. 3. Aortic Atherosclerosis (ICD10-I70.0).   RUQ [place] 07/21/21: IMPRESSION: 1. Cholelithiasis without evidence of acute cholecystitis. 2. Mild hepatic steatosis.   MRCP 07/21/21:   IMPRESSION: 1. No biliary ductal dilation. No choledocholithiasis. 2. Mild hepatic steatosis. 3. Cholelithiasis with dilation of the gallbladder and trace pericholecystic fluid. No gallbladder wall thickening or abnormal wall enhancement. Findings which are equivocal for acute cholecystitis. Consider further evaluation with nuclear medicine HIDA scan to assess cystic duct patency if clinically indicated.   HIDA 07/22/21: FINDINGS: Prompt uptake and biliary excretion of activity by the liver is seen. Gallbladder activity is not visualized before or after the administration of morphine. Additionally, there is a subtle rim of increased radiotracer activity in the hepatic parenchyma along the gallbladder fossa. Biliary activity passes into small bowel, consistent with patent  common bile duct.   IMPRESSION: Scintigraphic findings most consistent with acute cholecystitis.   Assessment and Plan: Diagnoses and all orders for this visit:   Adenocarcinoma of gallbladder (CMS-HCC) -     CT chest with contrast with 3D MIPS protocol; Future   S/P cholecystectomy       This is a 60 year old female otherwise in good health who presents with a T2 gallbladder adenocarcinoma incidentally diagnosed on pathology for cholecystectomy.  Her cystic duct margin was positive and there were no lymph nodes within the surgical segment.  He had extensive preoperative imaging including a CT abdomen pelvis as well as an MRCP, neither of which showed evidence of metastatic disease within the abdomen.  She will need a chest CT to complete her staging work-up.  We will also obtain repeat labs and a baseline CA 19-9.  Assuming her chest CT does not show any evidence of metastatic disease, I will proceed with an oncologic resection of her cancer, which will include a staging laparoscopy and if negative, I will proceed with laparotomy, portal lymph node sampling, and excision of her gallbladder fossa.  I will also resect the remainder of her cystic duct, and if this is positive for disease on  frozen section she will need a bile duct resection and reconstruction.  I carefully reviewed her MRCP.  The cystic duct appears to be draining into the common bile duct and I do not appreciate any aberrant anatomy, however there was concern that the cystic duct was draining into an accessory right hepatic duct at the time of her cholecystectomy, which could make biliary reconstruction more complex.  I had an extensive discussion with the patient today via interpreter regarding her diagnosis and the proposed treatment plan.  She expressed understanding and agrees to proceed.  We will obtain a staging chest CT this week, and if negative will proceed with surgery within 2 weeks.  All questions were answered and the  patient agrees to proceed.  Michaelle Birks, [facility], Hepatobiliary and Pancreatic Surgery 08/12/21 5:17 PM

## 2021-08-12 NOTE — H&P (Signed)
History of Present Illness: Michele Arnold is a 60 y.o. 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  She recently underwent a robotic-assisted cholecystectomy with Dr. Hassell Done 07/23/2021 for acute cholecystitis.  Intraoperatively she had a stone impacted in the infundibulum of the gallbladder, ICG was given, and it appeared that she had a short cystic duct draining into an accessory right hepatic duct.  Her final surgical pathology showed invasive adenocarcinoma with a positive cystic duct margin.  Postoperative course was unremarkable and she was discharged home on postop day 1.  She was referred to me after her path showed gallbladder cancer.  Preoperatively her LFTs were elevated and her bilirubin was 3.5, however all labs downtrended prior to discharge.   She is here today by herself, but her daughter was present via phone and served as an Astronomer.  Since her surgery the patient has overall been recovering well.  She denies any issues with her incisions.  She initially had some back pain after surgery but has not had any further abdominal pain.  The back pain has resolved.  She is eating without difficulty.  She was not aware of her pathology results prior to today.   Surgical Pathology:   FINAL MICROSCOPIC DIAGNOSIS:  A. GALLBLADDER, CHOLECYSTECTOMY:  - Invasive poorly differentiated adenocarcinoma, 4.3 cm, involving  gallbladder neck  - Carcinoma invades perimuscular soft tissue  - Cystic duct margin is focally positive for carcinoma  - Focally suspicious for lymphovascular invasion  - See oncology table Pathology:   ONCOLOGY TABLE:    GALLBLADDER, CARCINOMA: Resection    Procedure: Cholecystectomy  Tumor Site: Gallbladder neck  Tumor Size: 4.3 cm  Histologic Type: Adenocarcinoma  Histologic Grade: G3: Poorly differentiated  Tumor Extension: Tumor invades perimuscular connective tissue on the  peritoneal side without serosal  involvement  Margins:       Margin Status for Invasive Carcinoma: Cystic duct margin is  positive for carcinoma  Regional Lymph Nodes: Not applicable (no lymph nodes submitted or found)  Distant Metastasis:       Distant Site(s) Involved: Not applicable  Pathologic Stage Classification (pTNM, AJCC 8th Edition): pT2a, pN not  assigned      Review of Systems: A complete review of systems was obtained from the patient.  I have reviewed this information and discussed as appropriate with the patient.  See HPI as well for other ROS.     Medical History: Past Medical History Past Medical History: Diagnosis Date  Hypertension        Patient Active Problem List Diagnosis  Gallbladder malignant neoplasm (CMS-HCC)     Past Surgical History Past Surgical History: Procedure Laterality Date  CESAREAN SECTION      LAPAROSCOPIC CHOLECYSTECTOMY          Allergies No Known Allergies    Current Outpatient Medications on File Prior to Visit Medication Sig Dispense Refill  lisinopriL (ZESTRIL) 10 MG tablet Take 1 tablet by mouth once daily      hydroCHLOROthiazide (MICROZIDE) 12.5 mg capsule Take 12.5 mg by mouth once daily       No current facility-administered medications on file prior to visit.     Family History Family History Problem Relation Age of Onset  Diabetes Sister        Social History   Tobacco Use Smoking Status Never Smoker Smokeless Tobacco Never Used     Social History Social History    Socioeconomic History  Marital status: Single Tobacco Use  Smoking status: Never Smoker  Smokeless tobacco: Never Used Vaping Use  Vaping Use: Never used Substance and Sexual Activity  Alcohol use: Never  Drug use: Never      Objective:     Vitals:   08/12/21 1339 BP: 98/60 Pulse: 90 Temp: 36.7 C (98 F) SpO2: 97% Weight: 73.8 kg (162 lb 12.8 oz) Height: 152.4 cm (5')   Body mass index is 31.79 kg/m.   Physical Exam Vitals reviewed.   Constitutional:      General: She is not in acute distress.    Appearance: Normal appearance.  HENT:     Head: Normocephalic and atraumatic.  Eyes:     General: No scleral icterus.    Conjunctiva/sclera: Conjunctivae normal.  Pulmonary:     Effort: Pulmonary effort is normal. No respiratory distress.  Abdominal:     General: There is no distension.     Palpations: Abdomen is soft.     Tenderness: There is no abdominal tenderness.     Comments: Port site incisions are healing well with no erythema, induration or drainage.  Musculoskeletal:     Cervical back: Normal range of motion.  Neurological:     Mental Status: She is alert.       Labs, Imaging and Diagnostic Testing: CT abd/pelvis 07/21/21: IMPRESSION: 1. Cholelithiasis with distended gallbladder. No CT evidence of acute cholecystitis. Consider further evaluation with right upper quadrant ultrasound as clinically indicated. 2. Punctate nonobstructive left nephrolithiasis. 3. Aortic Atherosclerosis (ICD10-I70.0).   RUQ [place] 07/21/21: IMPRESSION: 1. Cholelithiasis without evidence of acute cholecystitis. 2. Mild hepatic steatosis.   MRCP 07/21/21:   IMPRESSION: 1. No biliary ductal dilation. No choledocholithiasis. 2. Mild hepatic steatosis. 3. Cholelithiasis with dilation of the gallbladder and trace pericholecystic fluid. No gallbladder wall thickening or abnormal wall enhancement. Findings which are equivocal for acute cholecystitis. Consider further evaluation with nuclear medicine HIDA scan to assess cystic duct patency if clinically indicated.   HIDA 07/22/21: FINDINGS: Prompt uptake and biliary excretion of activity by the liver is seen. Gallbladder activity is not visualized before or after the administration of morphine. Additionally, there is a subtle rim of increased radiotracer activity in the hepatic parenchyma along the gallbladder fossa. Biliary activity passes into small bowel, consistent with patent  common bile duct.   IMPRESSION: Scintigraphic findings most consistent with acute cholecystitis.   Assessment and Plan: Diagnoses and all orders for this visit:   Adenocarcinoma of gallbladder (CMS-HCC) -     CT chest with contrast with 3D MIPS protocol; Future   S/P cholecystectomy       This is a 60 year old female otherwise in good health who presents with a T2 gallbladder adenocarcinoma incidentally diagnosed on pathology for cholecystectomy.  Her cystic duct margin was positive and there were no lymph nodes within the surgical segment.  He had extensive preoperative imaging including a CT abdomen pelvis as well as an MRCP, neither of which showed evidence of metastatic disease within the abdomen.  She will need a chest CT to complete her staging work-up.  We will also obtain repeat labs and a baseline CA 19-9.  Assuming her chest CT does not show any evidence of metastatic disease, I will proceed with an oncologic resection of her cancer, which will include a staging laparoscopy and if negative, I will proceed with laparotomy, portal lymph node sampling, and excision of her gallbladder fossa.  I will also resect the remainder of her cystic duct, and if this is positive for disease on  frozen section she will need a bile duct resection and reconstruction.  I carefully reviewed her MRCP.  The cystic duct appears to be draining into the common bile duct and I do not appreciate any aberrant anatomy, however there was concern that the cystic duct was draining into an accessory right hepatic duct at the time of her cholecystectomy, which could make biliary reconstruction more complex.  I had an extensive discussion with the patient today via interpreter regarding her diagnosis and the proposed treatment plan.  She expressed understanding and agrees to proceed.  We will obtain a staging chest CT this week, and if negative will proceed with surgery within 2 weeks.  All questions were answered and the  patient agrees to proceed.  Michaelle Birks, [facility], Hepatobiliary and Pancreatic Surgery 08/12/21 5:17 PM

## 2021-08-13 ENCOUNTER — Other Ambulatory Visit: Payer: Self-pay | Admitting: Surgery

## 2021-08-13 ENCOUNTER — Other Ambulatory Visit (HOSPITAL_COMMUNITY): Payer: Self-pay | Admitting: Surgery

## 2021-08-13 DIAGNOSIS — C23 Malignant neoplasm of gallbladder: Secondary | ICD-10-CM

## 2021-08-19 ENCOUNTER — Other Ambulatory Visit: Payer: Self-pay

## 2021-08-19 NOTE — Progress Notes (Signed)
The proposed treatment discussed in conference is for discussion purpose only and is not a binding recommendation.  The patients have not been physically examined, or presented with their treatment options.  Therefore, final treatment plans cannot be decided.  

## 2021-08-20 ENCOUNTER — Other Ambulatory Visit: Payer: Self-pay

## 2021-08-20 ENCOUNTER — Ambulatory Visit (HOSPITAL_COMMUNITY)
Admission: RE | Admit: 2021-08-20 | Discharge: 2021-08-20 | Disposition: A | Discharge status: Home / Self Care | Payer: Self-pay | Source: Ambulatory Visit | Attending: Surgery | Admitting: Surgery

## 2021-08-20 DIAGNOSIS — C23 Malignant neoplasm of gallbladder: Secondary | ICD-10-CM | POA: Insufficient documentation

## 2021-08-20 MED ORDER — IOHEXOL 350 MG/ML SOLN
75.0000 mL | Freq: Once | INTRAVENOUS | Status: AC | PRN
  Administered 2021-08-20: 60 mL via INTRAVENOUS

## 2021-08-31 NOTE — Progress Notes (Signed)
Surgical Instructions   Your procedure is scheduled on Friday 09/04/2021.  Report to [facility] Main Entrance "A" at 05:30 A.M., then check in with the Admitting office.  Call [phone] if you have problems or questions between now and the morning of surgery:   Remember: Do not eat after midnight the night before your surgery  You may drink clear liquids until 04:30 the morning of your surgery.   Clear liquids allowed are: Water, Non-Citrus Juices (without pulp), Carbonated Beverages, Clear Tea, Black Coffee Only (NO MILK, CREAM, or POWDERED CREAMER of any kind), and Gatorade  Please complete 2- PRE-SURGERY ENSURES that were provided to you the night before surgery.  Please complete 1- PRE-SURGERY ENSURE that was provided to you by 04:30am the morning of surgery.  Please, if able, drink it in one sitting. DO NOT SIP.  Do not drink anything else once you have completed your PRE-SURGERY ENSURE.   Take these medicines the morning of surgery with A SIP OF WATER: NONE   If needed you may take these medications the morning of surgery: Acetaminophen (Tylenol)   As of today, STOP taking any Aspirin (unless otherwise instructed by your surgeon) or Aspirin-containing products; NSAIDS - Aleve, Naproxen, Ibuprofen, Motrin, Advil, Goody's, BC's, all herbal medications, fish oil, and all vitamins.   After your COVID test   You are not required to quarantine however you are required to wear a well-fitting mask when you are out and around people not in your household.  If your mask becomes wet or soiled, replace with a new one.  Wash your hands often with soap and water for 20 seconds or clean your hands with an alcohol-based hand sanitizer that contains at least 60% alcohol.  Do not share personal items.  Notify your provider: if you are in close contact with someone who has COVID  or if you develop a fever of 100.4 or greater, sneezing, cough, sore throat, shortness of breath or body  aches.          Do not wear jewelry or makeup  Do not wear lotions, powders, perfumes/colognes, or deodorant.  Do not shave 48 hours prior to surgery.  Men may shave face and neck.  Do not wear nail polish, gel polish, artificial nails, or any other type of covering on natural nails including fingernails and toenails. If patients have artificial nails, gel coating, etc. that need to be removed by a nail salon please have this removed prior to surgery or surgery may need to be canceled/delayed if the surgeon/ anesthesia feels like the patient is unable to be adequately monitored.  Do not bring valuables to the hospital - [facility] is not responsible for any belongings or valuables.  Do NOT Smoke (Tobacco/Vaping) or drink Alcohol 24 hours prior to your procedure  If you use a CPAP at night, you may bring all equipment for your overnight stay.   Contacts, glasses, hearing aids, dentures or partials may not be worn into surgery, please bring cases for these belongings   For patients admitted to the hospital, discharge time will be determined by your treatment team.   Patients discharged the day of surgery will not be allowed to drive home, and someone needs to stay with them for 24 hours.  NO VISITORS WILL BE ALLOWED IN PRE-OP WHERE PATIENTS ARE PREPPED FOR SURGERY.  ONLY 1 SUPPORT PERSON MAY BE PRESENT IN THE WAITING ROOM WHILE YOU ARE IN SURGERY.  IF YOU ARE TO BE ADMITTED, ONCE YOU  ARE IN YOUR ROOM YOU WILL BE ALLOWED TWO (2) VISITORS. 1 (ONE) VISITOR MAY STAY OVERNIGHT BUT MUST ARRIVE TO THE ROOM BY 8pm.  Minor children may have two parents present. Special consideration for safety and communication needs will be reviewed on a case by case basis.  Special instructions:    Oral Hygiene is also important to reduce your risk of infection.  Remember - BRUSH YOUR TEETH THE MORNING OF SURGERY WITH YOUR REGULAR TOOTHPASTE   [place] Preparing For [facility]  Before surgery, you can play  an important role. Because skin is not sterile, your skin needs to be as free of germs as possible. You can reduce the number of germs on your skin by washing with CHG (chlorahexidine gluconate) Soap before surgery.  CHG is an antiseptic cleaner which kills germs and bonds with the skin to continue killing germs even after washing.     Please do not use if you have an allergy to CHG or antibacterial soaps. If your skin becomes reddened/irritated stop using the CHG.  Do not shave (including legs and underarms) for at least 48 hours prior to first CHG shower. It is OK to shave your face.  Please follow these instructions carefully.     Shower the NIGHT BEFORE SURGERY and the MORNING OF SURGERY with CHG Soap.   If you chose to wash your hair, wash your hair first as usual with your normal shampoo. After you shampoo, rinse your hair and body thoroughly to remove the shampoo.    Then [employer] and genitals (private parts) with your normal soap and rinse thoroughly to remove soap.  Next use the CHG Soap as you would any other liquid soap. You can apply CHG directly to the skin and wash gently with a clean washcloth.   Apply the CHG Soap to your body ONLY FROM THE NECK DOWN.  Do not use on open wounds or open sores. Avoid contact with your eyes, ears, mouth and genitals (private parts). Wash Face and genitals (private parts)  with your normal soap.   Wash thoroughly, paying special attention to the area where your surgery will be performed.  Thoroughly rinse your body with warm water from the neck down.  DO NOT shower/wash with your normal soap after using and rinsing off the CHG Soap.  Pat yourself dry with a CLEAN TOWEL.  Wear CLEAN PAJAMAS to bed the night before surgery  Place CLEAN SHEETS on your bed the night before your surgery  DO NOT SLEEP WITH PETS.   Day of Surgery:  Take a shower with CHG soap. Wear Clean/Comfortable clothing the morning of surgery Do not apply any  deodorants/lotions.   Remember to brush your teeth WITH YOUR REGULAR TOOTHPASTE.   Please read over the following fact sheets that you were given.

## 2021-09-01 ENCOUNTER — Other Ambulatory Visit: Payer: Self-pay

## 2021-09-01 ENCOUNTER — Encounter (HOSPITAL_COMMUNITY)
Admission: RE | Admit: 2021-09-01 | Discharge: 2021-09-01 | Disposition: A | Discharge status: Home / Self Care | Payer: Self-pay | Source: Ambulatory Visit | Attending: Surgery | Admitting: Surgery

## 2021-09-01 ENCOUNTER — Encounter (HOSPITAL_COMMUNITY): Payer: Self-pay

## 2021-09-01 DIAGNOSIS — Z20822 Contact with and (suspected) exposure to covid-19: Secondary | ICD-10-CM | POA: Insufficient documentation

## 2021-09-01 DIAGNOSIS — Z01818 Encounter for other preprocedural examination: Secondary | ICD-10-CM | POA: Insufficient documentation

## 2021-09-01 DIAGNOSIS — Z9049 Acquired absence of other specified parts of digestive tract: Secondary | ICD-10-CM | POA: Insufficient documentation

## 2021-09-01 DIAGNOSIS — C23 Malignant neoplasm of gallbladder: Secondary | ICD-10-CM | POA: Insufficient documentation

## 2021-09-01 HISTORY — DX: Essential (primary) hypertension: I10

## 2021-09-01 HISTORY — DX: Malignant (primary) neoplasm, unspecified: C80.1

## 2021-09-01 LAB — CBC WITH DIFFERENTIAL/PLATELET
Abs Immature Granulocytes: 0.01 10*3/uL (ref 0.00–0.07)
Basophils Absolute: 0 10*3/uL (ref 0.0–0.1)
Basophils Relative: 1 %
Eosinophils Absolute: 0.2 10*3/uL (ref 0.0–0.5)
Eosinophils Relative: 3 %
HCT: 42.1 % (ref 36.0–46.0)
Hemoglobin: 13.9 g/dL (ref 12.0–15.0)
Immature Granulocytes: 0 %
Lymphocytes Relative: 34 %
Lymphs Abs: 2.3 10*3/uL (ref 0.7–4.0)
MCH: 31.8 pg (ref 26.0–34.0)
MCHC: 33 g/dL (ref 30.0–36.0)
MCV: 96.3 fL (ref 80.0–100.0)
Monocytes Absolute: 0.6 10*3/uL (ref 0.1–1.0)
Monocytes Relative: 8 %
Neutro Abs: 3.7 10*3/uL (ref 1.7–7.7)
Neutrophils Relative %: 54 %
Platelets: 368 10*3/uL (ref 150–400)
RBC: 4.37 MIL/uL (ref 3.87–5.11)
RDW: 11.9 % (ref 11.5–15.5)
WBC: 6.8 10*3/uL (ref 4.0–10.5)
nRBC: 0 % (ref 0.0–0.2)

## 2021-09-01 LAB — PROTIME-INR
INR: 1 (ref 0.8–1.2)
Prothrombin Time: 13.2 seconds (ref 11.4–15.2)

## 2021-09-01 LAB — COMPREHENSIVE METABOLIC PANEL
ALT: 20 U/L (ref 0–44)
AST: 23 U/L (ref 15–41)
Albumin: 4.2 g/dL (ref 3.5–5.0)
Alkaline Phosphatase: 89 U/L (ref 38–126)
Anion gap: 8 (ref 5–15)
BUN: 8 mg/dL (ref 6–20)
CO2: 26 mmol/L (ref 22–32)
Calcium: 9.6 mg/dL (ref 8.9–10.3)
Chloride: 105 mmol/L (ref 98–111)
Creatinine, Ser: 0.78 mg/dL (ref 0.44–1.00)
GFR, Estimated: >60 mL/min (ref >60)
Glucose, Bld: 103 mg/dL — ABNORMAL HIGH (ref 70–99)
Potassium: 3.8 mmol/L (ref 3.5–5.1)
Sodium: 139 mmol/L (ref 135–145)
Total Bilirubin: 1.1 mg/dL (ref 0.3–1.2)
Total Protein: 7.9 g/dL (ref 6.5–8.1)

## 2021-09-01 LAB — SARS CORONAVIRUS 2 (TAT 6-24 HRS): SARS Coronavirus 2: NEGATIVE

## 2021-09-01 NOTE — Anesthesia Preprocedure Evaluation (Addendum)
Anesthesia Evaluation  Patient identified by MRN, date of birth, ID band Patient awake  General Assessment Comment:History noted Dr. Nyoka Cowden  Reviewed: Allergy & Precautions, NPO status , Patient's Chart, lab work & pertinent test results  Airway Mallampati: II  TM Distance: >3 FB     Dental   Pulmonary neg pulmonary ROS,    breath sounds clear to auscultation       Cardiovascular hypertension,  Rhythm:Regular Rate:Normal     Neuro/Psych negative neurological ROS     GI/Hepatic Neg liver ROS,   Endo/Other  negative endocrine ROS  Renal/GU negative Renal ROS     Musculoskeletal   Abdominal   Peds  Hematology   Anesthesia Other Findings   Reproductive/Obstetrics                            Anesthesia Physical Anesthesia Plan  ASA: 2  Anesthesia Plan: General   Post-op Pain Management:    Induction: Intravenous  PONV Risk Score and Plan: 3 and Ondansetron, Dexamethasone and Midazolam  Airway Management Planned: Oral ETT  Additional Equipment:   Intra-op Plan:   Post-operative Plan:   Informed Consent: I have reviewed the patients History and Physical, chart, labs and discussed the procedure including the risks, benefits and alternatives for the proposed anesthesia with the patient or authorized representative who has indicated his/her understanding and acceptance.     Dental advisory given  Plan Discussed with: CRNA  Anesthesia Plan Comments: (PAT note written 09/01/2021 by Myra Gianotti, PA-C. )       Anesthesia Quick Evaluation

## 2021-09-01 NOTE — Progress Notes (Signed)
Surgical Instructions   Your procedure is scheduled on Friday 09/04/2021.  Report to [facility] Main Entrance "A" at 05:30 A.M., then check in with the Admitting office.  Call [phone] if you have problems or questions between now and the morning of surgery:   Remember: Do not eat after midnight the night before your surgery  You may drink clear liquids until 04:30 the morning of your surgery.   Clear liquids allowed are: Water, Non-Citrus Juices (without pulp), Carbonated Beverages, Clear Tea, Black Coffee Only (NO MILK, CREAM, or POWDERED CREAMER of any kind), and Gatorade  Please complete 2- PRE-SURGERY ENSURES that were provided to you the night before surgery.  Please complete 1- PRE-SURGERY ENSURE that was provided to you by 04:30am the morning of surgery.  Please, if able, drink it in one sitting. DO NOT SIP.  Do not drink anything else once you have completed your PRE-SURGERY ENSURE.   Take these medicines the morning of surgery with A SIP OF WATER: NONE   If needed you may take these medications the morning of surgery: Acetaminophen (Tylenol)   As of today, STOP taking any Aspirin (unless otherwise instructed by your surgeon) or Aspirin-containing products; NSAIDS - Aleve, Naproxen, Ibuprofen, Motrin, Advil, Goody's, BC's, all herbal medications, fish oil, and all vitamins.   After your COVID test   You are not required to quarantine however you are required to wear a well-fitting mask when you are out and around people not in your household.  If your mask becomes wet or soiled, replace with a new one.  Wash your hands often with soap and water for 20 seconds or clean your hands with an alcohol-based hand sanitizer that contains at least 60% alcohol.  Do not share personal items.  Notify your provider: if you are in close contact with someone who has COVID  or if you develop a fever of 100.4 or greater, sneezing, cough, sore throat, shortness of breath or body  aches.          Do not wear jewelry or makeup  Do not wear lotions, powders, perfumes/colognes, or deodorant.  Do not shave 48 hours prior to surgery.  Men may shave face and neck.  Do not wear nail polish, gel polish, artificial nails, or any other type of covering on natural nails including fingernails and toenails. If patients have artificial nails, gel coating, etc. that need to be removed by a nail salon please have this removed prior to surgery or surgery may need to be canceled/delayed if the surgeon/ anesthesia feels like the patient is unable to be adequately monitored.  Do not bring valuables to the hospital - [facility] is not responsible for any belongings or valuables.  Do NOT Smoke (Tobacco/Vaping) or drink Alcohol 24 hours prior to your procedure  If you use a CPAP at night, you may bring all equipment for your overnight stay.   Contacts, glasses, hearing aids, dentures or partials may not be worn into surgery, please bring cases for these belongings   For patients admitted to the hospital, discharge time will be determined by your treatment team.   Patients discharged the day of surgery will not be allowed to drive home, and someone needs to stay with them for 24 hours.  NO VISITORS WILL BE ALLOWED IN PRE-OP WHERE PATIENTS ARE PREPPED FOR SURGERY.  ONLY 1 SUPPORT PERSON MAY BE PRESENT IN THE WAITING ROOM WHILE YOU ARE IN SURGERY.  IF YOU ARE TO BE ADMITTED, ONCE YOU  ARE IN YOUR ROOM YOU WILL BE ALLOWED TWO (2) VISITORS. 1 (ONE) VISITOR MAY STAY OVERNIGHT BUT MUST ARRIVE TO THE ROOM BY 8pm.  Minor children may have two parents present. Special consideration for safety and communication needs will be reviewed on a case by case basis.  Special instructions:    Oral Hygiene is also important to reduce your risk of infection.  Remember - BRUSH YOUR TEETH THE MORNING OF SURGERY WITH YOUR REGULAR TOOTHPASTE   [place]- Preparing For Surgery  Before surgery, you can play  an important role. Because skin is not sterile, your skin needs to be as free of germs as possible. You can reduce the number of germs on your skin by washing with CHG (chlorahexidine gluconate) Soap before surgery.  CHG is an antiseptic cleaner which kills germs and bonds with the skin to continue killing germs even after washing.     Please do not use if you have an allergy to CHG or antibacterial soaps. If your skin becomes reddened/irritated stop using the CHG.  Do not shave (including legs and underarms) for at least 48 hours prior to first CHG shower. It is OK to shave your face.  Please follow these instructions carefully.     Shower the NIGHT BEFORE SURGERY and the MORNING OF SURGERY with CHG Soap.   If you chose to wash your hair, wash your hair first as usual with your normal shampoo. After you shampoo, rinse your hair and body thoroughly to remove the shampoo.    Then [employer] and genitals (private parts) with your normal soap and rinse thoroughly to remove soap.  Next use the CHG Soap as you would any other liquid soap. You can apply CHG directly to the skin and wash gently with a clean washcloth.   Apply the CHG Soap to your body ONLY FROM THE NECK DOWN.  Do not use on open wounds or open sores. Avoid contact with your eyes, ears, mouth and genitals (private parts). Wash Face and genitals (private parts)  with your normal soap.   Wash thoroughly, paying special attention to the area where your surgery will be performed.  Thoroughly rinse your body with warm water from the neck down.  DO NOT shower/wash with your normal soap after using and rinsing off the CHG Soap.  Pat yourself dry with a CLEAN TOWEL.  Wear CLEAN PAJAMAS to bed the night before surgery  Place CLEAN SHEETS on your bed the night before your surgery  DO NOT SLEEP WITH PETS.   Day of Surgery:  Take a shower with CHG soap. Wear Clean/Comfortable clothing the morning of surgery Do not apply any  deodorants/lotions.   Remember to brush your teeth WITH YOUR REGULAR TOOTHPASTE.   Please read over the following fact sheets that you were given.

## 2021-09-01 NOTE — Progress Notes (Addendum)
PAT appointment was completed using Michele Arnold, a [place].  PCP - none  Cardiologist - none  EP-none  Endocrine-none  Pulm-none  Chest x-ray -   EKG - 09/01/21  Stress Test -   ECHO -   Cardiac Cath -   AICD-no PM-no LOOP-no  Dialysis-no  Sleep Study - no CPAP -no   LABS-  ASA-no  ERAS-yes  HA1C-na Fasting Blood Sugar - na Checks Blood Sugar ____0_ times a day  Anesthesia- Ms Michele Arnold denies chest pain or shortness of breath at this time.  Patient Michele Arnold say that she has had chest pain before, last time was a couple of weeks ago. Ms Michele Arnold states it was in her mid chest, she was outside on a walk ,she denies  SOB, N/V or dizziness. Patient said the pain lasted a few minutes. Patient could not describe what the pain felt like. Michele Jarvis, PA- C reviewed chart and saw patient.  Pt denies having chest pain, sob, or fever at this time. All instructions explained to the pt, with a verbal understanding of the material. Pt agrees to go over the instructions while at home for a better understanding. Pt also instructed to self quarantine after being tested for COVID-19. The opportunity to ask questions was provided.

## 2021-09-01 NOTE — Progress Notes (Signed)
Anesthesia APP PAT Evaluation:   Case: [id number] Date/Time: 09/04/21 0715   Procedures:      STAGING LAPAROSCOPY     OPEN PARTIAL CENTRAL HEPATECTOMY, PORTAL LYMPH NODE DISSECTION     POSSIBLE BILE DUCT RESECTION AND RECONSTRUCTION WITH INTRAOPERATIVE ULTRASOUND   Anesthesia type: General   Pre-op diagnosis: gallbladder cancer   Location: [facility] OR ROOM 02 / [place] OR   Surgeons: Dwan Bolt, MD       DISCUSSION: Patient is a 60 year old [place] speaking female scheduled for the above procedure.  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  [facility] admission 07/21/21-07/24/21 for abdominal pain and nausea. LFTs elevated and CT showed gallstones with distended gallbladder.  GI and general surgery consulted.  She is diagnosed with gallstone pancreatitis and acute cholecystitis.  S/p robotic cholecystectomy 07/23/2021. As above, pathology + for adenocarcinoma. She was referred to Dr. Zenia Resides and the above procedure(s) recommended.  Spanish medical interpretor Spero Geralds was used during PAT visit. Patient reported a single episode of mild mid chest pain about 2 weeks ago when it was cold outside. Episode resolved in ~ 10 minutes, and she said there were no associated symptoms. She denied any recurrent symptoms and says she has been active since then. She is not doing heavy lifting since her surgery, but said she has been able to walk and clean her home without issues/CV symptoms. She denied chest pain, SOB, edema, palpitations, syncope. She denied pleuritic chest pain. Since surgery, she has not had any significant abdominal pain and denied N/V. No smoking or DM history. No known CAD/MI/CHF/arrhythmias history. Denied prior cardiac testing. She denied known anesthesia complications.   EKG showed NSR. Exam shows a pleasant Hispanic female in NAD. No conversational  dyspnea. No scleral icterus. No LE edema. Heart RRR, no murmur noted. Lungs clear. No carotid bruits noted. Provider wore N95 and universal face mask. Patient and interpretor wore universal face masks.  Since September cholecystectomy she had a single self-limiting episode of chest pain a few weeks ago while outside in the cold. No other associated symptoms. There is no pleuritic pain or edema. O2 sats 99% on RA. No SOB. No recurrent symptoms or exertional chest pain. Her EKG is normal. She tells me she is active, but avoiding heavy lifting post-op. If no new or recurrent CV symptoms then would not anticipate additional preoperative testing. Reviewed as well with anesthesiologist Laurie Panda, MD and Annye Asa, MD.  09/01/2021 presurgical COVID-19 test in process.   VS: BP 139/87   Pulse 82   Temp 36.9 C (Oral)   Resp 17   Ht 5\' 5"  (1.651 m)   Wt 72.3 kg   SpO2 99%   BMI 26.51 kg/m    PROVIDERS: She does not have a regular PCP. She could not name a specific clinic or medical provider that she sees. Reportedly her last medications were refilled during her hospital admission.    LABS: Labs reviewed: Acceptable for surgery. Cancer antigen 19-9 in process. A1c 5.6% on 07/21/21.  (all labs ordered are listed, but only abnormal results are displayed)  Labs Reviewed  COMPREHENSIVE METABOLIC PANEL - Abnormal; Notable for the following components:      Result Value   Glucose, Bld 103 (*)    All other components within normal limits  SARS CORONAVIRUS 2 (TAT 6-24 HRS)  CBC WITH DIFFERENTIAL/PLATELET  PROTIME-INR  CANCER ANTIGEN 19-9  TYPE AND  SCREEN  PREPARE RBC (CROSSMATCH)     IMAGES: CT Chest 08/20/21: FINDINGS: - Cardiovascular: Aorta is nonaneurysmal. 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 Normal cardiac size. No pericardial effusion. - Mediastinum/Nodes: Midline trachea. No thyroid mass. No significantly  enlarged lymph nodes. Esophagus within normal limits. - Lungs/Pleura: Lungs are clear. No pleural effusion or pneumothorax. - Upper Abdomen: Interval cholecystectomy. No acute abnormality in the upper abdomen. Cyst in the upper pole right kidney. - Musculoskeletal: No chest wall abnormality. No acute or significant osseous findings. IMPRESSION: Negative. No CT evidence for acute intrathoracic abnormality. Negative for pulmonary nodule or evidence for metastatic disease to the chest.  CT Abd/pelvis (pre-cholecystectomy) 07/21/21: IMPRESSION: 1. Cholelithiasis with distended gallbladder. No CT evidence of acute cholecystitis. Consider further evaluation with right upper quadrant ultrasound as clinically indicated. 2. Punctate nonobstructive left nephrolithiasis. 3. Aortic Atherosclerosis (ICD10-I70.0).   EKG: 09/01/21: NSR   CV: N/A  Past Medical History:  Diagnosis Date   Cancer ([place])    Hypertension     Past Surgical History:  Procedure Laterality Date   CHOLECYSTECTOMY  07/23/2021    MEDICATIONS:  acetaminophen (TYLENOL) 325 MG tablet   acetaminophen (TYLENOL) 650 MG CR tablet   hydrochlorothiazide (MICROZIDE) 12.5 MG capsule   lisinopril (ZESTRIL) 10 MG tablet   oxyCODONE (OXY IR/ROXICODONE) 5 MG immediate release tablet   No current facility-administered medications for this encounter.    Myra Gianotti, PA-C Surgical Short Stay/Anesthesiology [facility] Phone [phone] [facility] Phone [phone] 09/01/2021 4:08 PM

## 2021-09-02 LAB — PREPARE RBC (CROSSMATCH)

## 2021-09-02 LAB — CANCER ANTIGEN 19-9: CA 19-9: 8 U/mL (ref 0–35)

## 2021-09-04 ENCOUNTER — Inpatient Hospital Stay (HOSPITAL_COMMUNITY): Payer: Self-pay | Admitting: Certified Registered Nurse Anesthetist

## 2021-09-04 ENCOUNTER — Encounter (HOSPITAL_COMMUNITY): Payer: Self-pay | Admitting: Surgery

## 2021-09-04 ENCOUNTER — Inpatient Hospital Stay (HOSPITAL_COMMUNITY): Payer: Self-pay | Admitting: Vascular Surgery

## 2021-09-04 ENCOUNTER — Other Ambulatory Visit: Payer: Self-pay

## 2021-09-04 ENCOUNTER — Inpatient Hospital Stay (HOSPITAL_COMMUNITY)
Admission: RE | Admit: 2021-09-04 | Discharge: 2021-09-08 | DRG: 422 | Disposition: A | Discharge status: Home / Self Care | Payer: Self-pay | Attending: Surgery | Admitting: Surgery

## 2021-09-04 ENCOUNTER — Inpatient Hospital Stay (HOSPITAL_COMMUNITY): Payer: Self-pay

## 2021-09-04 ENCOUNTER — Encounter (HOSPITAL_COMMUNITY)
Admission: RE | Disposition: A | Discharge status: Home / Self Care | Payer: Self-pay | Source: Home / Self Care | Attending: Surgery

## 2021-09-04 DIAGNOSIS — C23 Malignant neoplasm of gallbladder: Principal | ICD-10-CM | POA: Diagnosis present

## 2021-09-04 DIAGNOSIS — Z833 Family history of diabetes mellitus: Secondary | ICD-10-CM

## 2021-09-04 DIAGNOSIS — Z419 Encounter for procedure for purposes other than remedying health state, unspecified: Secondary | ICD-10-CM

## 2021-09-04 DIAGNOSIS — I10 Essential (primary) hypertension: Secondary | ICD-10-CM | POA: Diagnosis present

## 2021-09-04 HISTORY — PX: LYMPH NODE DISSECTION: SHX5087

## 2021-09-04 HISTORY — PX: LAPAROSCOPY: SHX197

## 2021-09-04 HISTORY — PX: OPEN HEPATECTOMY [83]: SHX5986

## 2021-09-04 HISTORY — PX: INTRAOPERATIVE CHOLANGIOGRAM: SHX5230

## 2021-09-04 LAB — CBC
HCT: 37.1 % (ref 36.0–46.0)
Hemoglobin: 12.4 g/dL (ref 12.0–15.0)
MCH: 32.1 pg (ref 26.0–34.0)
MCHC: 33.4 g/dL (ref 30.0–36.0)
MCV: 96.1 fL (ref 80.0–100.0)
Platelets: 308 10*3/uL (ref 150–400)
RBC: 3.86 MIL/uL — ABNORMAL LOW (ref 3.87–5.11)
RDW: 12.1 % (ref 11.5–15.5)
WBC: 11.2 10*3/uL — ABNORMAL HIGH (ref 4.0–10.5)
nRBC: 0 % (ref 0.0–0.2)

## 2021-09-04 LAB — COMPREHENSIVE METABOLIC PANEL
ALT: 100 U/L — ABNORMAL HIGH (ref 0–44)
AST: 173 U/L — ABNORMAL HIGH (ref 15–41)
Albumin: 3.3 g/dL — ABNORMAL LOW (ref 3.5–5.0)
Alkaline Phosphatase: 66 U/L (ref 38–126)
Anion gap: 10 (ref 5–15)
BUN: 10 mg/dL (ref 6–20)
CO2: 22 mmol/L (ref 22–32)
Calcium: 8.6 mg/dL — ABNORMAL LOW (ref 8.9–10.3)
Chloride: 104 mmol/L (ref 98–111)
Creatinine, Ser: 0.74 mg/dL (ref 0.44–1.00)
GFR, Estimated: >60 mL/min (ref >60)
Glucose, Bld: 169 mg/dL — ABNORMAL HIGH (ref 70–99)
Potassium: 3.3 mmol/L — ABNORMAL LOW (ref 3.5–5.1)
Sodium: 136 mmol/L (ref 135–145)
Total Bilirubin: 1.4 mg/dL — ABNORMAL HIGH (ref 0.3–1.2)
Total Protein: 6.4 g/dL — ABNORMAL LOW (ref 6.5–8.1)

## 2021-09-04 LAB — GLUCOSE, CAPILLARY: Glucose-Capillary: 156 mg/dL — ABNORMAL HIGH (ref 70–99)

## 2021-09-04 LAB — ABO/RH: ABO/RH(D): O POS

## 2021-09-04 SURGERY — LAPAROSCOPY, DIAGNOSTIC
Anesthesia: General | Site: Abdomen

## 2021-09-04 MED ORDER — SUGAMMADEX SODIUM 200 MG/2ML IV SOLN
INTRAVENOUS | Status: DC | PRN
  Administered 2021-09-04: 200 mg via INTRAVENOUS

## 2021-09-04 MED ORDER — ONDANSETRON HCL 4 MG/2ML IJ SOLN
4.0000 mg | Freq: Four times a day (QID) | INTRAMUSCULAR | Status: DC | PRN
  Administered 2021-09-06: 4 mg via INTRAVENOUS
  Filled 2021-09-04: qty 2

## 2021-09-04 MED ORDER — CHLORHEXIDINE GLUCONATE CLOTH 2 % EX PADS: 6.0000 | MEDICATED_PAD | Freq: Once | CUTANEOUS | Status: DC

## 2021-09-04 MED ORDER — POTASSIUM CHLORIDE CRYS ER 20 MEQ PO TBCR
40.0000 meq | EXTENDED_RELEASE_TABLET | Freq: Once | ORAL | Status: AC
  Administered 2021-09-04: 40 meq via ORAL
  Filled 2021-09-04: qty 2

## 2021-09-04 MED ORDER — LACTATED RINGERS IV SOLN: INTRAVENOUS | Status: DC | PRN

## 2021-09-04 MED ORDER — KETOROLAC TROMETHAMINE 15 MG/ML IJ SOLN
15.0000 mg | Freq: Three times a day (TID) | INTRAMUSCULAR | Status: AC
  Administered 2021-09-04 – 2021-09-06 (×6): 15 mg via INTRAVENOUS
  Filled 2021-09-04 (×6): qty 1

## 2021-09-04 MED ORDER — MIDAZOLAM HCL 2 MG/2ML IJ SOLN
INTRAMUSCULAR | Status: AC
  Filled 2021-09-04: qty 2

## 2021-09-04 MED ORDER — CHLORHEXIDINE GLUCONATE 0.12 % MT SOLN
15.0000 mL | Freq: Once | OROMUCOSAL | Status: AC
  Administered 2021-09-04: 15 mL via OROMUCOSAL
  Filled 2021-09-04: qty 15

## 2021-09-04 MED ORDER — HYDROMORPHONE HCL 1 MG/ML IJ SOLN
0.2500 mg | INTRAMUSCULAR | Status: DC | PRN
  Administered 2021-09-04: 0.5 mg via INTRAVENOUS

## 2021-09-04 MED ORDER — DIPHENHYDRAMINE HCL 25 MG PO CAPS: 25.0000 mg | ORAL_CAPSULE | Freq: Four times a day (QID) | ORAL | Status: DC | PRN

## 2021-09-04 MED ORDER — GABAPENTIN 300 MG PO CAPS
300.0000 mg | ORAL_CAPSULE | ORAL | Status: AC
  Administered 2021-09-04: 300 mg via ORAL
  Filled 2021-09-04: qty 1

## 2021-09-04 MED ORDER — ACETAMINOPHEN 500 MG PO TABS
1000.0000 mg | ORAL_TABLET | Freq: Three times a day (TID) | ORAL | Status: DC
  Administered 2021-09-04 – 2021-09-08 (×11): 1000 mg via ORAL
  Filled 2021-09-04 (×11): qty 2

## 2021-09-04 MED ORDER — ENSURE PRE-SURGERY PO LIQD: 296.0000 mL | Freq: Once | ORAL | Status: DC

## 2021-09-04 MED ORDER — 0.9 % SODIUM CHLORIDE (POUR BTL) OPTIME
TOPICAL | Status: DC | PRN
  Administered 2021-09-04 (×2): 1000 mL

## 2021-09-04 MED ORDER — ENOXAPARIN SODIUM 40 MG/0.4ML IJ SOSY
40.0000 mg | PREFILLED_SYRINGE | INTRAMUSCULAR | Status: DC
  Administered 2021-09-05 – 2021-09-08 (×4): 40 mg via SUBCUTANEOUS
  Filled 2021-09-04 (×4): qty 0.4

## 2021-09-04 MED ORDER — HYDROMORPHONE HCL 1 MG/ML IJ SOLN
INTRAMUSCULAR | Status: AC
  Administered 2021-09-04: 0.5 mg via INTRAVENOUS
  Filled 2021-09-04: qty 1

## 2021-09-04 MED ORDER — OXYCODONE HCL 5 MG PO TABS
5.0000 mg | ORAL_TABLET | ORAL | Status: DC | PRN
  Administered 2021-09-04 – 2021-09-05 (×3): 5 mg via ORAL
  Administered 2021-09-05: 10 mg via ORAL
  Administered 2021-09-05 (×2): 5 mg via ORAL
  Administered 2021-09-06: 10 mg via ORAL
  Administered 2021-09-07 (×2): 5 mg via ORAL
  Administered 2021-09-07 – 2021-09-08 (×2): 10 mg via ORAL
  Administered 2021-09-08: 5 mg via ORAL
  Administered 2021-09-08: 10 mg via ORAL
  Filled 2021-09-04 (×4): qty 1
  Filled 2021-09-04: qty 2
  Filled 2021-09-04: qty 1
  Filled 2021-09-04: qty 2
  Filled 2021-09-04: qty 1
  Filled 2021-09-04: qty 2
  Filled 2021-09-04: qty 1
  Filled 2021-09-04: qty 2
  Filled 2021-09-04 (×4): qty 1

## 2021-09-04 MED ORDER — HYDROCHLOROTHIAZIDE 12.5 MG PO CAPS
12.5000 mg | ORAL_CAPSULE | Freq: Every day | ORAL | Status: DC
  Administered 2021-09-05 – 2021-09-08 (×4): 12.5 mg via ORAL
  Filled 2021-09-04 (×5): qty 1

## 2021-09-04 MED ORDER — ENSURE PRE-SURGERY PO LIQD: 592.0000 mL | Freq: Once | ORAL | Status: DC

## 2021-09-04 MED ORDER — ROCURONIUM BROMIDE 10 MG/ML (PF) SYRINGE
PREFILLED_SYRINGE | INTRAVENOUS | Status: DC | PRN
  Administered 2021-09-04 (×2): 50 mg via INTRAVENOUS

## 2021-09-04 MED ORDER — PHENYLEPHRINE HCL-NACL 20-0.9 MG/250ML-% IV SOLN
INTRAVENOUS | Status: DC | PRN
Start: 2021-09-04 — End: 2021-09-04
  Administered 2021-09-04: 20 ug/min via INTRAVENOUS

## 2021-09-04 MED ORDER — DIPHENHYDRAMINE HCL 50 MG/ML IJ SOLN: 25.0000 mg | Freq: Four times a day (QID) | INTRAMUSCULAR | Status: DC | PRN

## 2021-09-04 MED ORDER — EPHEDRINE SULFATE-NACL 50-0.9 MG/10ML-% IV SOSY
PREFILLED_SYRINGE | INTRAVENOUS | Status: DC | PRN
  Administered 2021-09-04: 5 mg via INTRAVENOUS
  Administered 2021-09-04: 2.5 mg via INTRAVENOUS

## 2021-09-04 MED ORDER — LACTATED RINGERS IV SOLN: INTRAVENOUS | Status: DC

## 2021-09-04 MED ORDER — FENTANYL CITRATE (PF) 250 MCG/5ML IJ SOLN
INTRAMUSCULAR | Status: DC | PRN
  Administered 2021-09-04: 50 ug via INTRAVENOUS
  Administered 2021-09-04: 25 ug via INTRAVENOUS
  Administered 2021-09-04 (×7): 50 ug via INTRAVENOUS
  Administered 2021-09-04: 25 ug via INTRAVENOUS

## 2021-09-04 MED ORDER — LIDOCAINE 2% (20 MG/ML) 5 ML SYRINGE
INTRAMUSCULAR | Status: DC | PRN
  Administered 2021-09-04: 100 mg via INTRAVENOUS

## 2021-09-04 MED ORDER — PROPOFOL 10 MG/ML IV BOLUS
INTRAVENOUS | Status: AC
  Filled 2021-09-04: qty 20

## 2021-09-04 MED ORDER — PROPOFOL 10 MG/ML IV BOLUS
INTRAVENOUS | Status: DC | PRN
  Administered 2021-09-04: 150 mg via INTRAVENOUS

## 2021-09-04 MED ORDER — OXYCODONE HCL 5 MG PO TABS
ORAL_TABLET | ORAL | Status: AC
  Filled 2021-09-04: qty 1

## 2021-09-04 MED ORDER — FENTANYL CITRATE (PF) 250 MCG/5ML IJ SOLN
INTRAMUSCULAR | Status: AC
  Filled 2021-09-04: qty 5

## 2021-09-04 MED ORDER — FENTANYL CITRATE (PF) 100 MCG/2ML IJ SOLN: 25.0000 ug | INTRAMUSCULAR | Status: DC | PRN

## 2021-09-04 MED ORDER — LABETALOL HCL 5 MG/ML IV SOLN: 5.0000 mg | INTRAVENOUS | Status: DC | PRN

## 2021-09-04 MED ORDER — MIDAZOLAM HCL 2 MG/2ML IJ SOLN
INTRAMUSCULAR | Status: DC | PRN
Start: 2021-09-04 — End: 2021-09-04
  Administered 2021-09-04: 2 mg via INTRAVENOUS

## 2021-09-04 MED ORDER — ACETAMINOPHEN 500 MG PO TABS
1000.0000 mg | ORAL_TABLET | ORAL | Status: AC
Start: 2021-09-04 — End: 2021-09-04
  Administered 2021-09-04: 1000 mg via ORAL
  Filled 2021-09-04: qty 2

## 2021-09-04 MED ORDER — BUPIVACAINE HCL 0.25 % IJ SOLN: INTRAMUSCULAR | Status: DC | PRN

## 2021-09-04 MED ORDER — ONDANSETRON HCL 4 MG/2ML IJ SOLN
INTRAMUSCULAR | Status: DC | PRN
  Administered 2021-09-04: 4 mg via INTRAVENOUS

## 2021-09-04 MED ORDER — DOCUSATE SODIUM 100 MG PO CAPS
100.0000 mg | ORAL_CAPSULE | Freq: Two times a day (BID) | ORAL | Status: DC
  Administered 2021-09-04 – 2021-09-08 (×8): 100 mg via ORAL
  Filled 2021-09-04 (×8): qty 1

## 2021-09-04 MED ORDER — SODIUM CHLORIDE 0.9 % IR SOLN
Status: DC | PRN
  Administered 2021-09-04: 1000 mL

## 2021-09-04 MED ORDER — LABETALOL HCL 5 MG/ML IV SOLN
INTRAVENOUS | Status: AC
  Administered 2021-09-04: 5 mg via INTRAVENOUS
  Filled 2021-09-04: qty 4

## 2021-09-04 MED ORDER — CEFAZOLIN SODIUM-DEXTROSE 2-4 GM/100ML-% IV SOLN
2.0000 g | INTRAVENOUS | Status: AC
  Administered 2021-09-04: 2 g via INTRAVENOUS
  Filled 2021-09-04: qty 100

## 2021-09-04 MED ORDER — DEXAMETHASONE SODIUM PHOSPHATE 10 MG/ML IJ SOLN
INTRAMUSCULAR | Status: DC | PRN
Start: 2021-09-04 — End: 2021-09-04
  Administered 2021-09-04: 10 mg via INTRAVENOUS

## 2021-09-04 MED ORDER — BUPIVACAINE-EPINEPHRINE (PF) 0.25% -1:200000 IJ SOLN
INTRAMUSCULAR | Status: AC
  Filled 2021-09-04: qty 30

## 2021-09-04 MED ORDER — ORAL CARE MOUTH RINSE: 15.0000 mL | Freq: Once | OROMUCOSAL | Status: AC

## 2021-09-04 MED ORDER — ONDANSETRON 4 MG PO TBDP
4.0000 mg | ORAL_TABLET | Freq: Four times a day (QID) | ORAL | Status: DC | PRN
  Administered 2021-09-08 (×2): 4 mg via ORAL
  Filled 2021-09-04 (×2): qty 1

## 2021-09-04 MED ORDER — HYDROMORPHONE HCL 1 MG/ML IJ SOLN: 0.5000 mg | INTRAMUSCULAR | Status: DC | PRN

## 2021-09-04 MED ORDER — SODIUM CHLORIDE 0.9 % IV SOLN
INTRAVENOUS | Status: DC | PRN
  Administered 2021-09-04: 14 mL

## 2021-09-04 MED ORDER — PHENYLEPHRINE 40 MCG/ML (10ML) SYRINGE FOR IV PUSH (FOR BLOOD PRESSURE SUPPORT)
PREFILLED_SYRINGE | INTRAVENOUS | Status: DC | PRN
  Administered 2021-09-04: 80 ug via INTRAVENOUS

## 2021-09-04 SURGICAL SUPPLY — 78 items
ADH SKN CLS APL DERMABOND .7 (GAUZE/BANDAGES/DRESSINGS) ×2
APL PRP STRL LF DISP 70% ISPRP (MISCELLANEOUS) ×1
BAG COUNTER SPONGE SURGICOUNT (BAG) ×2 IMPLANT
BAG SPNG CNTER NS LX DISP (BAG) ×1
BIOPATCH RED 1 DISK 7.0 (GAUZE/BANDAGES/DRESSINGS) ×3 IMPLANT
BLADE CLIPPER SURG (BLADE) IMPLANT
CANISTER SUCT 3000ML PPV (MISCELLANEOUS) ×1 IMPLANT
CHLORAPREP W/TINT 26 (MISCELLANEOUS) ×2 IMPLANT
CLIP TI MEDIUM 24 (CLIP) ×2 IMPLANT
CLIP TI WIDE RED SMALL 24 (CLIP) ×3 IMPLANT
CNTNR URN SCR LID CUP LEK RST (MISCELLANEOUS) IMPLANT
CONT SPEC 4OZ STRL OR WHT (MISCELLANEOUS) ×2
COVER MAYO STAND STRL (DRAPES) ×1 IMPLANT
COVER SURGICAL LIGHT HANDLE (MISCELLANEOUS) ×2 IMPLANT
DERMABOND ADVANCED (GAUZE/BANDAGES/DRESSINGS) ×2
DERMABOND ADVANCED .7 DNX12 (GAUZE/BANDAGES/DRESSINGS) ×2 IMPLANT
DRAIN CHANNEL 19F RND (DRAIN) ×2 IMPLANT
DRAPE C-ARM 42X120 X-RAY (DRAPES) ×1 IMPLANT
DRAPE INCISE IOBAN 66X45 STRL (DRAPES) ×2 IMPLANT
DRAPE WARM FLUID 44X44 (DRAPES) ×3 IMPLANT
DRSG TEGADERM 4X4.75 (GAUZE/BANDAGES/DRESSINGS) ×3 IMPLANT
DRSG TELFA 3X8 NADH (GAUZE/BANDAGES/DRESSINGS) ×2 IMPLANT
ELECT BLADE 6.5 EXT (BLADE) ×2 IMPLANT
ELECT CAUTERY BLADE 6.4 (BLADE) ×2 IMPLANT
ELECT PAD DSPR THERM+ ADLT (MISCELLANEOUS) ×2 IMPLANT
ELECT REM PT RETURN 9FT ADLT (ELECTROSURGICAL) ×2
ELECTRODE REM PT RTRN 9FT ADLT (ELECTROSURGICAL) ×1 IMPLANT
EVACUATOR SILICONE 100CC (DRAIN) ×1 IMPLANT
GAUZE 4X4 16PLY ~~LOC~~+RFID DBL (SPONGE) ×1 IMPLANT
GLOVE SURG POLY MICRO LF SZ5.5 (GLOVE) ×2 IMPLANT
GLOVE SURG UNDER POLY LF SZ6 (GLOVE) ×2 IMPLANT
GOWN STRL REUS W/ TWL LRG LVL3 (GOWN DISPOSABLE) ×2 IMPLANT
GOWN STRL REUS W/TWL LRG LVL3 (GOWN DISPOSABLE) ×4
HAND PENCIL TRP OPTION (MISCELLANEOUS) ×2 IMPLANT
HANDLE SUCTION POOLE (INSTRUMENTS) ×1 IMPLANT
KIT BASIN OR (CUSTOM PROCEDURE TRAY) ×2 IMPLANT
KIT TURNOVER KIT B (KITS) ×2 IMPLANT
LIGASURE IMPACT 36 18CM CVD LR (INSTRUMENTS) IMPLANT
NDL INSUFFLATION 14GA 120MM (NEEDLE) ×1 IMPLANT
NEEDLE INSUFFLATION 14GA 120MM (NEEDLE) ×2 IMPLANT
NS IRRIG 1000ML POUR BTL (IV SOLUTION) ×4 IMPLANT
PAD ARMBOARD 7.5X6 YLW CONV (MISCELLANEOUS) ×4 IMPLANT
PAD DRESSING TELFA 3X8 NADH (GAUZE/BANDAGES/DRESSINGS) IMPLANT
PENCIL SMOKE EVACUATOR (MISCELLANEOUS) ×2 IMPLANT
RETRACTOR WOUND ALXS 34CM XLRG (MISCELLANEOUS) IMPLANT
RTRCTR WOUND ALEXIS 34CM XLRG (MISCELLANEOUS) ×2
SEALER BIPOLAR AQUA 6.0 (INSTRUMENTS) ×2 IMPLANT
SET CHOLANGIOGRAPH 5 50 .035 (SET/KITS/TRAYS/PACK) ×1 IMPLANT
SET TUBE SMOKE EVAC HIGH FLOW (TUBING) ×2 IMPLANT
SLEEVE ENDOPATH XCEL 5M (ENDOMECHANICALS) ×2 IMPLANT
SPONGE DRAIN TRACH 4X4 STRL 2S (GAUZE/BANDAGES/DRESSINGS) ×1 IMPLANT
SPONGE T-LAP 18X18 ~~LOC~~+RFID (SPONGE) ×3 IMPLANT
SUCTION POOLE HANDLE (INSTRUMENTS) ×2
SUT CHROMIC 0 BP (SUTURE) ×4 IMPLANT
SUT CHROMIC 3 0 CT 36 (SUTURE) IMPLANT
SUT ETHILON 2 0 FS 18 (SUTURE) ×2 IMPLANT
SUT MNCRL AB 4-0 PS2 18 (SUTURE) ×3 IMPLANT
SUT PDS AB 1 TP1 96 (SUTURE) ×4 IMPLANT
SUT PDS AB 4-0 RB1 27 (SUTURE) ×1 IMPLANT
SUT PROLENE 2 0 SH DA (SUTURE) IMPLANT
SUT PROLENE 3 0 SH 48 (SUTURE) ×1 IMPLANT
SUT PROLENE 4 0 RB 1 (SUTURE) ×2
SUT PROLENE 4-0 RB1 .5 CRCL 36 (SUTURE) ×2 IMPLANT
SUT SILK 2 0 SH (SUTURE) IMPLANT
SUT SILK 2 0 TIES 10X30 (SUTURE) ×2 IMPLANT
SUT SILK 2 0SH CR/8 30 (SUTURE) ×1 IMPLANT
SUT SILK 3 0 TIES 10X30 (SUTURE) ×1 IMPLANT
SUT SILK 3 0SH CR/8 30 (SUTURE) ×1 IMPLANT
SUT VIC AB 3-0 SH 27 (SUTURE) ×4
SUT VIC AB 3-0 SH 27X BRD (SUTURE) IMPLANT
SYR BULB IRRIG 60ML STRL (SYRINGE) ×2 IMPLANT
TOWEL GREEN STERILE (TOWEL DISPOSABLE) ×2 IMPLANT
TOWEL GREEN STERILE FF (TOWEL DISPOSABLE) ×2 IMPLANT
TRAY FOLEY MTR SLVR 14FR STAT (SET/KITS/TRAYS/PACK) ×2 IMPLANT
TRAY LAPAROSCOPIC MC (CUSTOM PROCEDURE TRAY) ×2 IMPLANT
TROCAR XCEL NON-BLD 5MMX100MML (ENDOMECHANICALS) ×2 IMPLANT
TUBE CONNECTING 12X1/4 (SUCTIONS) ×1 IMPLANT
WARMER LAPAROSCOPE (MISCELLANEOUS) ×2 IMPLANT

## 2021-09-04 NOTE — Op Note (Signed)
Date: 09/04/21  Patient: Michele Arnold MRN: [id number]  Preoperative Diagnosis: Gallbladder adenocarcinoma Postoperative Diagnosis: Same  Procedure: Staging laparoscopy Exploratory laparotomy with intraoperative cholangiogram Excision of new cystic duct margin Partial central hepatectomy (resection of gallbladder fossa - segments 4b and 5) Portal lymph node dissection  Surgeon: Michaelle Birks, MD Assistant: Gurney Maxin, MD  EBL: 150 mL  Anesthesia: General endotracheal  Specimens:  New cystic duct margin Portal lymph nodes Gallbladder fossa Station 8 lymph node  Indications: Michele Arnold is a 60 year old female who presented with cholecystitis and underwent a laparoscopic cholecystectomy.  Surgical pathology incidentally showed a T2 gallbladder adenocarcinoma the infundibulum, with a positive cystic duct margin.  She was evaluated in clinic and and staging scans were negative for metastatic disease.  She was brought to the operating room electively for oncologic resection.  Findings: Normal intraoperative cholangiogram.  Remnant cystic duct stump was dissected out and excised, and on frozen analysis was negative for carcinoma at the new margin.  Procedure details: Informed consent was obtained in the preoperative area prior to the procedure. The patient was brought to the operating room and placed on the table in the supine position.  General anesthesia was induced and appropriate lines and drains were placed for intraoperative monitoring. Perioperative antibiotics were administered per SCIP guidelines. The abdomen was prepped and draped in the usual sterile fashion. A pre-procedure timeout was taken verifying patient identity, surgical site and procedure to be performed.  An small supraumbilical skin incision was made, and the subcutaneous tissue was divided with cautery. The umbilical stalk was grasped and elevated.  A Veress needle was inserted through the  fascia and intraperitoneal placement was confirmed with the saline drop test.  The abdomen was insufflated and a 5 mm Visiport was placed.  The peritoneal cavity was inspected, including the liver, both hemidiaphragms, and the peritoneal surface throughout the abdomen. There was no evidence of metastatic disease. The port was removed and the abdomen was desufflated.  An upper midline skin incision was made, subcutaneous tissue was divided with cautery, and the fascia was opened along the linea alba.  The peritoneum was opened.  The falciform ligament was taken down off the abdominal wall, ligated with 2-0 silk ties and divided.  An Allexis wound protector and Bookwalter fixed retractor were placed.  Omental adhesions were taken off the gallbladder fossa.  The cystic duct stump was identified via the previously placed clips.  The peritoneal covering over the distal common bile duct was carefully elevated and opened with cautery.  The right hepatic artery was visualized and palpated passing anterior to the common hepatic duct.  This was carefully preserved during the dissection.  Lymphatic tissue surrounding the common bile duct was carefully dissected out and sent for routine pathology as portal lymph nodes.  The clips were removed from the cystic duct stump.  A cholangiocatheter was then placed into the remnant cystic duct, and a cholangiogram was performed with fluoroscopy.  This demonstrated filling of the remnant cystic duct stump, entire common bile duct, common hepatic duct and the right and left hepatic ducts with no aberrant biliary anatomy.  There was also prompt filling of the duodenum with contrast.  There was at least 1 cm of remnant cystic duct prior to its insertion into the common bile duct, well below the bifurcation of the left and right hepatic ducts.  The cholangiocatheter was removed, and the cystic duct stump was carefully dissected out circumferentially.  The cystic duct was then clamped close  to its insertion into the common bile duct, and the cystic duct remnant was then divided sharply just above the clamp.  The cystic duct remnant was sent as a new cystic duct margin for frozen section, which was negative for malignant cells.  The cystic duct stump was then closed with a running 4-0 PDS suture.  Next a 2cm margin of liver around the gallbladder fossa was marked out using cautery.  0 chromic stay sutures were placed in the planned specimen.  The gallbladder fossa was then excised, using a crush-clamp technique to divide the liver parenchyma, and Aquamantys to maintain hemostasis.  Visible vessels were ligated with clips prior to division.  The liver specimen was sent for routine pathology.  Hemostasis was achieved on the cut surface of the liver using Aquamantys and argon.   Next the station 8 lymph node was carefully dissected off the common hepatic artery and sent for routine pathology.  Hemostasis was achieved with argon.  The porta hepatis was palpated and was soft with no firm tissue.  The majority of the portal lymph nodes had already been dissected out and excised during dissection of the cystic duct stump.  The surgical site was irrigated with warm saline and appeared hemostatic.  The right hepatic artery had a palpable pulse. A clean lap pad was compressed onto the cut surface of the liver and there was no evidence of bile leak.  The closure of the cystic duct stump was carefully examined and a clean lap pad was applied, and there was no evidence of bile leak.  A falciform ligament flap was mobilized and placed over the cut surface of the liver.  A 19 [place] JP drain was placed adjacent to the cystic duct stump closure and brought out through the right lateral abdominal wall.  It was secured to the skin with a 2-0 nylon suture.  The retractor and wound protector were removed.  The fascia was closed with a running looped 1 PDS suture.  Scarpa's fascia was closed with a running 3-0 Vicryl,  and the skin was closed with a running subcuticular 4 Monocryl suture.  Dermabond was applied.  The patient tolerated the procedure with no apparent complications.  All counts were correct x2 at the end of the procedure. The patient was extubated and taken to PACU in stable condition.  Michaelle Birks, MD 09/04/21 10:33 AM

## 2021-09-04 NOTE — Anesthesia Procedure Notes (Signed)
Arterial Line Insertion Start/End10/21/2022 7:20 AM, 09/04/2021 7:30 AM Performed by: Dorthea Cove, CRNA, CRNA  Patient location: Pre-op. Preanesthetic checklist: patient identified, IV checked, site marked, risks and benefits discussed, surgical consent, monitors and equipment checked, pre-op evaluation, timeout performed and anesthesia consent Lidocaine 1% used for infiltration Right, radial was placed Catheter size: 20 G Hand hygiene performed  and maximum sterile barriers used   Attempts: 1 Procedure performed without using ultrasound guided technique. Following insertion, dressing applied and Biopatch. Post procedure assessment: normal and unchanged  Patient tolerated the procedure well with no immediate complications.

## 2021-09-04 NOTE — Anesthesia Postprocedure Evaluation (Signed)
Anesthesia Post Note  Patient: [name]  Procedure(s) Performed: STAGING LAPAROSCOPY (Abdomen) OPEN PARTIAL CENTRAL HEPATECTOMY (Abdomen) INTRAOPERATIVE CHOLANGIOGRAM (Abdomen) PORTAL LYMPH NODE DISSECTION (Abdomen)     Patient location during evaluation: PACU Anesthesia Type: General Level of consciousness: awake Pain management: pain level controlled Vital Signs Assessment: post-procedure vital signs reviewed and stable Respiratory status: spontaneous breathing Cardiovascular status: stable Postop Assessment: no apparent nausea or vomiting Anesthetic complications: no   No notable events documented.  Last Vitals:  Vitals:   09/04/21 1200 09/04/21 1215  BP: (!) 150/84 (!) 145/78  Pulse: 70 71  Resp: 12 12  Temp:  (!) 36.1 C  SpO2: 99% 98%    Last Pain:  Vitals:   09/04/21 1230  TempSrc:   PainSc: 5                  Michele Arnold

## 2021-09-04 NOTE — Anesthesia Procedure Notes (Signed)
Procedure Name: Intubation Date/Time: 09/04/2021 7:48 AM Performed by: Dorthea Cove, CRNA Pre-anesthesia Checklist: Patient identified, Emergency Drugs available, Suction available and Patient being monitored Patient Re-evaluated:Patient Re-evaluated prior to induction Oxygen Delivery Method: Circle system utilized Preoxygenation: Pre-oxygenation with 100% oxygen Induction Type: IV induction Ventilation: Mask ventilation without difficulty Laryngoscope Size: Mac and 3 Grade View: Grade II Tube type: Oral Tube size: 7.0 mm Number of attempts: 1 Airway Equipment and Method: Stylet and Oral airway Placement Confirmation: ETT inserted through vocal cords under direct vision, positive ETCO2 and breath sounds checked- equal and bilateral Secured at: 23 cm Tube secured with: Tape Dental Injury: Teeth and Oropharynx as per pre-operative assessment

## 2021-09-04 NOTE — Anesthesia Procedure Notes (Signed)
Anesthesia Regional Block: TAP block   Pre-Anesthetic Checklist: , timeout performed,  Correct Patient, Correct Site, Correct Laterality,  Correct Procedure, Correct Position, site marked,  Risks and benefits discussed,  Surgical consent,  Pre-op evaluation,  At surgeon's request and post-op pain management  Laterality: Left and Right         Needles:  Injection technique: Single-shot  Needle Type: Echogenic Stimulator Needle          Additional Needles:   Procedures: Doppler guided,,,, ultrasound used (permanent image in chart),,    Narrative:  Start time: 09/04/2021 7:05 AM End time: 09/04/2021 7:25 AM Injection made incrementally with aspirations every 5 mL.  Performed by: Personally  Anesthesiologist: Belinda Block, MD

## 2021-09-04 NOTE — Transfer of Care (Signed)
Immediate Anesthesia Transfer of Care Note  Patient: Michele Arnold  Procedure(s) Performed: STAGING LAPAROSCOPY (Abdomen) OPEN PARTIAL CENTRAL HEPATECTOMY (Abdomen) INTRAOPERATIVE CHOLANGIOGRAM (Abdomen) PORTAL LYMPH NODE DISSECTION (Abdomen)  Patient Location: PACU  Anesthesia Type:General  Level of Consciousness: awake, alert  and oriented  Airway & Oxygen Therapy: Patient Spontanous Breathing and Patient connected to nasal cannula oxygen  Post-op Assessment: Report given to RN and Post -op Vital signs reviewed and stable  Post vital signs: Reviewed and stable  Last Vitals:  Vitals Value Taken Time  BP 167/95 09/04/21 1034  Temp    Pulse 82 09/04/21 1040  Resp 16 09/04/21 1040  SpO2 96 % 09/04/21 1040  Vitals shown include unvalidated device data.  Last Pain:  Vitals:   09/04/21 0619  TempSrc:   PainSc: 0-No pain    Dr. Nyoka Cowden called to bedside to discuss pain management and HTN. PACU RN given verbal orders see MAR.   Patients Stated Pain Goal: 2 (11/18/02 5913)  Complications: No notable events documented.

## 2021-09-04 NOTE — Interval H&P Note (Signed)
History and Physical Interval Note:  09/04/2021 7:06 AM  Michele Arnold  has presented today for surgery, with the diagnosis of gallbladder cancer.  The various methods of treatment have been discussed with the patient and family. After consideration of risks, benefits and other options for treatment, the patient has consented to  Procedure(s): STAGING LAPAROSCOPY (N/A) OPEN PARTIAL CENTRAL HEPATECTOMY, PORTAL LYMPH NODE DISSECTION (N/A) POSSIBLE BILE DUCT RESECTION AND RECONSTRUCTION WITH INTRAOPERATIVE ULTRASOUND (N/A) as a surgical intervention.  The patient's history has been reviewed, patient examined, no change in status, stable for surgery.  I have reviewed the patient's chart and labs.  Questions were answered to the patient's satisfaction.  Admit to inpatient postoperatively.   Dwan Bolt

## 2021-09-05 ENCOUNTER — Encounter (HOSPITAL_COMMUNITY): Payer: Self-pay | Admitting: Surgery

## 2021-09-05 LAB — CBC
HCT: 34 % — ABNORMAL LOW (ref 36.0–46.0)
Hemoglobin: 11.4 g/dL — ABNORMAL LOW (ref 12.0–15.0)
MCH: 32.1 pg (ref 26.0–34.0)
MCHC: 33.5 g/dL (ref 30.0–36.0)
MCV: 95.8 fL (ref 80.0–100.0)
Platelets: 289 10*3/uL (ref 150–400)
RBC: 3.55 MIL/uL — ABNORMAL LOW (ref 3.87–5.11)
RDW: 12.1 % (ref 11.5–15.5)
WBC: 10.9 10*3/uL — ABNORMAL HIGH (ref 4.0–10.5)
nRBC: 0 % (ref 0.0–0.2)

## 2021-09-05 LAB — COMPREHENSIVE METABOLIC PANEL
ALT: 126 U/L — ABNORMAL HIGH (ref 0–44)
AST: 169 U/L — ABNORMAL HIGH (ref 15–41)
Albumin: 3 g/dL — ABNORMAL LOW (ref 3.5–5.0)
Alkaline Phosphatase: 62 U/L (ref 38–126)
Anion gap: 6 (ref 5–15)
BUN: 9 mg/dL (ref 6–20)
CO2: 22 mmol/L (ref 22–32)
Calcium: 8.7 mg/dL — ABNORMAL LOW (ref 8.9–10.3)
Chloride: 109 mmol/L (ref 98–111)
Creatinine, Ser: 0.68 mg/dL (ref 0.44–1.00)
GFR, Estimated: >60 mL/min (ref >60)
Glucose, Bld: 123 mg/dL — ABNORMAL HIGH (ref 70–99)
Potassium: 4.3 mmol/L (ref 3.5–5.1)
Sodium: 137 mmol/L (ref 135–145)
Total Bilirubin: 1.8 mg/dL — ABNORMAL HIGH (ref 0.3–1.2)
Total Protein: 6 g/dL — ABNORMAL LOW (ref 6.5–8.1)

## 2021-09-05 MED ORDER — PHENOL 1.4 % MT LIQD
1.0000 | OROMUCOSAL | Status: DC | PRN
  Administered 2021-09-05: 1 via OROMUCOSAL
  Filled 2021-09-05: qty 177

## 2021-09-05 MED ORDER — CHLORHEXIDINE GLUCONATE CLOTH 2 % EX PADS
6.0000 | MEDICATED_PAD | Freq: Every day | CUTANEOUS | Status: DC
  Administered 2021-09-05 – 2021-09-07 (×3): 6 via TOPICAL

## 2021-09-05 NOTE — Evaluation (Signed)
Physical Therapy Evaluation Patient Details Name: Michele Arnold MRN: [id number] DOB: 24-Apr-1961 Today's Date: 09/05/2021  History of Present Illness  60 y/o female s/p ex lap with excision of new cystic duct margin and partial central hepatectomy and portal lymph node dissection on 10/21. Recently underwent robotic assisted cholecystectomy on 07/23/2021 for acute cholecystitis. 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 No significant PMH.  Clinical Impression  PTA, patient lives with husband and reports independence. Patient currently limited by pain in abdomen. Patient presents with weakness and decreased activity tolerance. Patient ambulating short distance with min guard and HHAx1 for comfort. Anticipate patient will progress quickly with reduction in pain. Patient will benefit from skilled PT services during acute stay to address listed deficits. No PT follow up recommended at this time.        Recommendations for follow up therapy are one component of a multi-disciplinary discharge planning process, led by the attending physician.  Recommendations may be updated based on patient status, additional functional criteria and insurance authorization.  Follow Up Recommendations No PT follow up    Equipment Recommendations  None recommended by PT    Recommendations for Other Services       Precautions / Restrictions Precautions Precautions: Fall Precaution Comments: abdominal incision Restrictions Weight Bearing Restrictions: No      Mobility  Bed Mobility Overal bed mobility: Needs Assistance Bed Mobility: Supine to Sit     Supine to sit: Min assist     General bed mobility comments: instructed on log roll technique. MinA for trunk elevation    Transfers Overall transfer level: Needs assistance Equipment used: None Transfers: Sit to/from Stand Sit to Stand: Min guard         General transfer comment: min guard for  safety  Ambulation/Gait Ambulation/Gait assistance: Min guard Gait Distance (Feet): 15 Feet Assistive device: 1 person hand held assist Gait Pattern/deviations: Step-through pattern Gait velocity: decreased   General Gait Details: min guard for safety. HHAx1 for comfort. Distance limited by pain  Stairs            Wheelchair Mobility    Modified Rankin (Stroke Patients Only)       Balance Overall balance assessment: No apparent balance deficits (not formally assessed)                                           Pertinent Vitals/Pain Pain Assessment: Faces Faces Pain Scale: Hurts even more Pain Location: abdominal incision Pain Descriptors / Indicators: Operative site guarding;Grimacing Pain Intervention(s): Monitored during session;Limited activity within patient's tolerance;Repositioned    Home Living Family/patient expects to be discharged to:: Private residence Living Arrangements: Spouse/significant other Available Help at Discharge: Family Type of Home: House Home Access: Level entry     Home Layout: One level Home Equipment: None      Prior Function Level of Independence: Independent               Hand Dominance        Extremity/Trunk Assessment   Upper Extremity Assessment Upper Extremity Assessment: Overall WFL for tasks assessed    Lower Extremity Assessment Lower Extremity Assessment: Overall WFL for tasks assessed    Cervical / Trunk Assessment Cervical / Trunk Assessment: Normal  Communication   Communication: Prefers language other than English  Cognition Arousal/Alertness: Awake/alert Behavior During Therapy: WFL for tasks assessed/performed Overall Cognitive Status:  Within Functional Limits for tasks assessed                                        General Comments      Exercises     Assessment/Plan    PT Assessment Patient needs continued PT services  PT Problem List Decreased  activity tolerance;Decreased mobility       PT Treatment Interventions Gait training;Functional mobility training;Therapeutic activities;Therapeutic exercise;Patient/family education    PT Goals (Current goals can be found in the Care Plan section)  Acute Rehab PT Goals Patient Stated Goal: reduce pain PT Goal Formulation: With patient Time For Goal Achievement: 09/19/21 Potential to Achieve Goals: Good    Frequency Min 3X/week   Barriers to discharge        Co-evaluation               AM-PAC PT "6 Clicks" Mobility  Outcome Measure Help needed turning from your back to your side while in a flat bed without using bedrails?: A Little Help needed moving from lying on your back to sitting on the side of a flat bed without using bedrails?: A Little Help needed moving to and from a bed to a chair (including a wheelchair)?: A Little Help needed standing up from a chair using your arms (e.g., wheelchair or bedside chair)?: A Little Help needed to walk in hospital room?: A Little Help needed climbing 3-5 steps with a railing? : A Little 6 Click Score: 18    End of Session   Activity Tolerance: Patient tolerated treatment well Patient left: in chair;with call bell/phone within reach;with chair alarm set;with family/visitor present Nurse Communication: Mobility status PT Visit Diagnosis: Muscle weakness (generalized) (M62.81)    Time: 1100-1120 PT Time Calculation (min) (ACUTE ONLY): 20 min   Charges:   PT Evaluation $PT Eval Low Complexity: 1 Low          Jhanvi Drakeford A. Gilford Rile PT, DPT Acute Rehabilitation Services Pager [phone] Office [phone]   Linna Hoff 09/05/2021, 1:20 PM

## 2021-09-05 NOTE — Plan of Care (Signed)

## 2021-09-05 NOTE — Progress Notes (Signed)
1 Day Post-Op   Subjective/Chief Complaint: Complains of some soreness and hasn't eaten much. No nausea   Objective: Vital signs in last 24 hours: Temp:  [97 F (36.1 C)-99.1 F (37.3 C)] 99.1 F (37.3 C) (10/22 0801) Pulse Rate:  [60-91] 68 (10/22 0801) Resp:  [12-20] 20 (10/22 0801) BP: (111-167)/(49-126) 128/61 (10/22 0801) SpO2:  [92 %-99 %] 93 % (10/22 0801) Arterial Line BP: (159-225)/(75-125) 185/77 (10/21 1515) Last BM Date: 09/03/21  Intake/Output from previous day: 10/21 0701 - 10/22 0700 In: 2141.8 [I.V.:2041.8; IV Piggyback:100] Out: 7588 [Urine:1590; Drains:5; Blood:150] Intake/Output this shift: Total I/O In: 1184.4 [I.V.:1184.4] Out: 100 [Drains:100]  General appearance: alert and cooperative Resp: clear to auscultation bilaterally Cardio: regular rate and rhythm GI: soft, mild tenderness. Drain output serosanguinous. Needs to be emptied  Lab Results:  Recent Labs    09/04/21 1345 09/05/21 0322  WBC 11.2* 10.9*  HGB 12.4 11.4*  HCT 37.1 34.0*  PLT 308 289   BMET Recent Labs    09/04/21 1345 09/05/21 0322  NA 136 137  K 3.3* 4.3  CL 104 109  CO2 22 22  GLUCOSE 169* 123*  BUN 10 9  CREATININE 0.74 0.68  CALCIUM 8.6* 8.7*   PT/INR No results for input(s): LABPROT, INR in the last 72 hours. ABG No results for input(s): PHART, HCO3 in the last 72 hours.  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 EXAM: INTRAOPERATIVE CHOLANGIOGRAM TECHNIQUE: Cholangiographic images from the C-arm fluoroscopic device were submitted for interpretation post-operatively. Please see the procedural report for the amount of contrast and the fluoroscopy time utilized. COMPARISON:  None. FINDINGS: Intraoperative cholangiogram performed during the open surgical procedure. Catheter within the residual cystic duct. There  is leakage of contrast at the injection site. Residual cystic duct, biliary confluence, common hepatic duct, and common bile duct are patent. Contrast easily drains into the duodenum. No biliary obstruction pattern, severe stricture, or large filling defect. IMPRESSION: Patent biliary tree. Electronically Signed   By: Jerilynn Mages.  Shick M.D.   On: 09/04/2021 09:43    Anti-infectives: Anti-infectives (From admission, onward)    Start     Dose/Rate Route Frequency Ordered Stop   09/04/21 0615  ceFAZolin (ANCEF) IVPB 2g/100 mL premix        2 g 200 mL/hr over 30 Minutes Intravenous On call to O.R. 09/04/21 0603 09/04/21 0752       Assessment/Plan: s/p Procedure(s): STAGING LAPAROSCOPY (N/A) OPEN PARTIAL CENTRAL HEPATECTOMY (N/A) INTRAOPERATIVE CHOLANGIOGRAM (N/A) PORTAL LYMPH NODE DISSECTION (N/A) Advance diet. Allow fulls today Empty drain and record output POD 2 from liver resection Continue to monitor  LOS: 1 day    Autumn Messing III 09/05/2021

## 2021-09-06 LAB — COMPREHENSIVE METABOLIC PANEL
ALT: 120 U/L — ABNORMAL HIGH (ref 0–44)
AST: 101 U/L — ABNORMAL HIGH (ref 15–41)
Albumin: 3.1 g/dL — ABNORMAL LOW (ref 3.5–5.0)
Alkaline Phosphatase: 63 U/L (ref 38–126)
Anion gap: 7 (ref 5–15)
BUN: 6 mg/dL (ref 6–20)
CO2: 24 mmol/L (ref 22–32)
Calcium: 8.3 mg/dL — ABNORMAL LOW (ref 8.9–10.3)
Chloride: 108 mmol/L (ref 98–111)
Creatinine, Ser: 0.69 mg/dL (ref 0.44–1.00)
GFR, Estimated: >60 mL/min (ref >60)
Glucose, Bld: 113 mg/dL — ABNORMAL HIGH (ref 70–99)
Potassium: 3.7 mmol/L (ref 3.5–5.1)
Sodium: 139 mmol/L (ref 135–145)
Total Bilirubin: 1.8 mg/dL — ABNORMAL HIGH (ref 0.3–1.2)
Total Protein: 5.9 g/dL — ABNORMAL LOW (ref 6.5–8.1)

## 2021-09-06 LAB — CBC
HCT: 34 % — ABNORMAL LOW (ref 36.0–46.0)
Hemoglobin: 11.2 g/dL — ABNORMAL LOW (ref 12.0–15.0)
MCH: 31.8 pg (ref 26.0–34.0)
MCHC: 32.9 g/dL (ref 30.0–36.0)
MCV: 96.6 fL (ref 80.0–100.0)
Platelets: 259 10*3/uL (ref 150–400)
RBC: 3.52 MIL/uL — ABNORMAL LOW (ref 3.87–5.11)
RDW: 12.2 % (ref 11.5–15.5)
WBC: 10.3 10*3/uL (ref 4.0–10.5)
nRBC: 0 % (ref 0.0–0.2)

## 2021-09-06 NOTE — Progress Notes (Signed)
2 Days Post-Op   Subjective/Chief Complaint: Tolerated some full liquids.  + BM and flatus. Did have some nausea this AM.   Objective: Vital signs in last 24 hours: Temp:  [98.4 F (36.9 C)-99 F (37.2 C)] 98.9 F (37.2 C) (10/23 0721) Pulse Rate:  [71-80] 72 (10/23 0721) Resp:  [16-20] 17 (10/23 0721) BP: (105-161)/(60-85) 153/79 (10/23 0721) SpO2:  [93 %-96 %] 95 % (10/23 0721) Last BM Date:  (PTS)  Intake/Output from previous day: 10/22 0701 - 10/23 0700 In: 1915.4 [I.V.:1915.4] Out: 4245 [Urine:4000; Drains:245] Intake/Output this shift: No intake/output data recorded.  General appearance: alert and cooperative Resp: clear to auscultation bilaterally Cardio: regular rate and rhythm GI: soft, mild tenderness. Drain output serosanguinous. Needs to be emptied  Lab Results:  Recent Labs    09/05/21 0322 09/06/21 0201  WBC 10.9* 10.3  HGB 11.4* 11.2*  HCT 34.0* 34.0*  PLT 289 259   BMET Recent Labs    09/05/21 0322 09/06/21 0201  NA 137 139  K 4.3 3.7  CL 109 108  CO2 22 24  GLUCOSE 123* 113*  BUN 9 6  CREATININE 0.68 0.69  CALCIUM 8.7* 8.3*   PT/INR No results for input(s): LABPROT, INR in the last 72 hours. ABG No results for input(s): PHART, HCO3 in the last 72 hours.  Invalid input(s): PCO2, PO2  Studies/Results: No results found.  Anti-infectives: Anti-infectives (From admission, onward)    Start     Dose/Rate Route Frequency Ordered Stop   09/04/21 0615  ceFAZolin (ANCEF) IVPB 2g/100 mL premix        2 g 200 mL/hr over 30 Minutes Intravenous On call to O.R. 09/04/21 0603 09/04/21 0752       Assessment/Plan: s/p Procedure(s): STAGING LAPAROSCOPY (N/A) OPEN PARTIAL CENTRAL HEPATECTOMY (N/A) INTRAOPERATIVE CHOLANGIOGRAM (N/A) PORTAL LYMPH NODE DISSECTION (N/A) Advance diet. Soft diet today.  Empty drain and record output POD 2 from liver resection Continue to monitor Anticipate home in the next few days.    LOS: 2 days     Stark Klein 09/06/2021

## 2021-09-07 LAB — COMPREHENSIVE METABOLIC PANEL
ALT: 95 U/L — ABNORMAL HIGH (ref 0–44)
AST: 60 U/L — ABNORMAL HIGH (ref 15–41)
Albumin: 3.2 g/dL — ABNORMAL LOW (ref 3.5–5.0)
Alkaline Phosphatase: 77 U/L (ref 38–126)
Anion gap: 10 (ref 5–15)
BUN: <5 mg/dL — ABNORMAL LOW (ref 6–20)
CO2: 24 mmol/L (ref 22–32)
Calcium: 9 mg/dL (ref 8.9–10.3)
Chloride: 104 mmol/L (ref 98–111)
Creatinine, Ser: 0.79 mg/dL (ref 0.44–1.00)
GFR, Estimated: >60 mL/min (ref >60)
Glucose, Bld: 177 mg/dL — ABNORMAL HIGH (ref 70–99)
Potassium: 3.7 mmol/L (ref 3.5–5.1)
Sodium: 138 mmol/L (ref 135–145)
Total Bilirubin: 1.4 mg/dL — ABNORMAL HIGH (ref 0.3–1.2)
Total Protein: 6.8 g/dL (ref 6.5–8.1)

## 2021-09-07 LAB — GLUCOSE, CAPILLARY: Glucose-Capillary: 111 mg/dL — ABNORMAL HIGH (ref 70–99)

## 2021-09-07 LAB — SURGICAL PATHOLOGY

## 2021-09-07 MED ORDER — POLYETHYLENE GLYCOL 3350 17 G PO PACK
17.0000 g | PACK | Freq: Every day | ORAL | Status: DC | PRN
  Filled 2021-09-07: qty 1

## 2021-09-07 MED ORDER — HYDROMORPHONE HCL 1 MG/ML IJ SOLN
0.5000 mg | INTRAMUSCULAR | Status: DC | PRN
Start: 2021-09-07 — End: 2021-09-08

## 2021-09-07 MED ORDER — LISINOPRIL 10 MG PO TABS
10.0000 mg | ORAL_TABLET | Freq: Every day | ORAL | Status: DC
  Administered 2021-09-07 – 2021-09-08 (×2): 10 mg via ORAL
  Filled 2021-09-07 (×2): qty 1

## 2021-09-07 NOTE — Progress Notes (Signed)
Physical Therapy Treatment Patient Details Name: Michele Arnold MRN: [id number] DOB: 12-01-1960 Today's Date: 09/07/2021   History of Present Illness 60 y/o female s/p ex lap with excision of new cystic duct margin and partial central hepatectomy and portal lymph node dissection on 10/21. Recently underwent robotic assisted cholecystectomy on 07/23/2021 for acute cholecystitis. 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 No significant PMH.    PT Comments    Pt progressing well towards her physical therapy goals, demonstrating improved activity tolerance and ambulation distance. Pt ambulating 150 feet with a walker at a min guard assist level; PT provided pillow splinting for additional support. Pt will have good family support/assist upon discharge home. Education provided regarding activity recommendations.   [employer] interpreter Roma Kayser [phone] assisted with interpreting this session    Recommendations for follow up therapy are one component of a multi-disciplinary discharge planning process, led by the attending physician.  Recommendations may be updated based on patient status, additional functional criteria and insurance authorization.  Follow Up Recommendations  No PT follow up     Assistance Recommended at Discharge Intermittent Supervision/Assistance  Equipment Recommendations  None recommended by PT    Recommendations for Other Services       Precautions / Restrictions Precautions Precautions: Fall Precaution Comments: abdominal incision Restrictions Weight Bearing Restrictions: No     Mobility  Bed Mobility Overal bed mobility: Needs Assistance Bed Mobility: Sit to Sidelying         Sit to sidelying: Min assist General bed mobility comments: Assist for LE management back into bed    Transfers Overall transfer level: Needs assistance Equipment used: None Transfers: Sit to/from Stand Sit to Stand: Min  guard           General transfer comment: min guard for safety    Ambulation/Gait Ambulation/Gait assistance: Min guard Gait Distance (Feet): 150 Feet Assistive device: Rolling walker (2 wheels) Gait Pattern/deviations: Step-through pattern;Decreased stride length Gait velocity: decreased   General Gait Details: Min guard for safety, slower pace and frequent rest breaks towards end of walk   Stairs             Wheelchair Mobility    Modified Rankin (Stroke Patients Only)       Balance Overall balance assessment: Mild deficits observed, not formally tested                                          Cognition Arousal/Alertness: Awake/alert Behavior During Therapy: WFL for tasks assessed/performed Overall Cognitive Status: Within Functional Limits for tasks assessed                                          Exercises      General Comments        Pertinent Vitals/Pain Pain Assessment: Faces Faces Pain Scale: Hurts even more Pain Location: abdominal incision Pain Descriptors / Indicators: Operative site guarding;Grimacing Pain Intervention(s): Limited activity within patient's tolerance;Monitored during session;Patient requesting pain meds-RN notified    Home Living                          Prior Function            PT Goals (current goals can now be found in  the care plan section) Acute Rehab PT Goals Patient Stated Goal: reduce pain PT Goal Formulation: With patient Time For Goal Achievement: 09/19/21 Potential to Achieve Goals: Good Progress towards PT goals: Progressing toward goals    Frequency    Min 3X/week      PT Plan Current plan remains appropriate    Co-evaluation              AM-PAC PT "6 Clicks" Mobility   Outcome Measure  Help needed turning from your back to your side while in a flat bed without using bedrails?: A Little Help needed moving from lying on your back to  sitting on the side of a flat bed without using bedrails?: A Little Help needed moving to and from a bed to a chair (including a wheelchair)?: A Little Help needed standing up from a chair using your arms (e.g., wheelchair or bedside chair)?: A Little Help needed to walk in hospital room?: A Little Help needed climbing 3-5 steps with a railing? : A Little 6 Click Score: 18    End of Session   Activity Tolerance: Patient tolerated treatment well Patient left: with call bell/phone within reach;with family/visitor present;in bed Nurse Communication: Mobility status PT Visit Diagnosis: Muscle weakness (generalized) (M62.81)     Time: 0623-7628 PT Time Calculation (min) (ACUTE ONLY): 33 min  Charges:  $Therapeutic Activity: 23-37 mins                     Wyona Almas, PT, DPT [place] Pager [phone] Office [phone]    Deno Etienne 09/07/2021, 4:15 PM

## 2021-09-07 NOTE — Progress Notes (Signed)
    3 Days Post-Op  Subjective: Afebrile, vitals stable, pain well-controlled. Tolerating PO. Reports right hand swelling, IV removed.   Objective: Vital signs in last 24 hours: Temp:  [98.1 F (36.7 C)-99 F (37.2 C)] 98.1 F (36.7 C) (10/24 0720) Pulse Rate:  [68-86] 72 (10/24 0720) Resp:  [14-18] 18 (10/24 0323) BP: (117-149)/(59-79) 145/68 (10/24 0720) SpO2:  [92 %-97 %] 97 % (10/24 0720) Last BM Date:  (PTS)  Intake/Output from previous day: 10/23 0701 - 10/24 0700 In: 1269.2 [P.O.:480; I.V.:789.2] Out: 3540 [Urine:3500; Drains:40] Intake/Output this shift: No intake/output data recorded.  PE: General: resting comfortably, NAD Neuro: alert and oriented, no focal deficits Resp: normal work of breathing on room air CV: RRR Abdomen: soft, nondistended, nontender to palpation. Midline incision clean and dry with no erythema, induration or drainage. JP with serosanguinous drainage. Extremities: warm and well-perfused, mild right hand and forearm swelling, motor function in tact   Lab Results:  Recent Labs    09/05/21 0322 09/06/21 0201  WBC 10.9* 10.3  HGB 11.4* 11.2*  HCT 34.0* 34.0*  PLT 289 259   BMET Recent Labs    09/05/21 0322 09/06/21 0201  NA 137 139  K 4.3 3.7  CL 109 108  CO2 22 24  GLUCOSE 123* 113*  BUN 9 6  CREATININE 0.68 0.69  CALCIUM 8.7* 8.3*   PT/INR No results for input(s): LABPROT, INR in the last 72 hours. CMP     Component Value Date/Time   NA 139 09/06/2021 0201   K 3.7 09/06/2021 0201   CL 108 09/06/2021 0201   CO2 24 09/06/2021 0201   GLUCOSE 113 (H) 09/06/2021 0201   BUN 6 09/06/2021 0201   CREATININE 0.69 09/06/2021 0201   CALCIUM 8.3 (L) 09/06/2021 0201   PROT 5.9 (L) 09/06/2021 0201   ALBUMIN 3.1 (L) 09/06/2021 0201   AST 101 (H) 09/06/2021 0201   ALT 120 (H) 09/06/2021 0201   ALKPHOS 63 09/06/2021 0201   BILITOT 1.8 (H) 09/06/2021 0201   GFRNONAA >60 09/06/2021 0201   Lipase     Component Value Date/Time    LIPASE 33 07/21/2021 0531         Assessment/Plan 60 yo female with gallbladder cancer, POD3 s/p open partial central hepatectomy and portal lymph node dissection. - Regular diet - SLIV - R hand swelling presumably secondary to IV infiltration, continue to monitor  - Remove foley catheter - Remove JP drain - VTE: lovenox, SCDs - Dispo: transfer to med-surg floor, tentative discharge home tomorrow    LOS: 3 days    Michaelle Birks, MD [facility] Surgery General, Hepatobiliary and Pancreatic Surgery 09/07/21 7:58 AM

## 2021-09-08 LAB — GLUCOSE, CAPILLARY
Glucose-Capillary: 103 mg/dL — ABNORMAL HIGH (ref 70–99)
Glucose-Capillary: 107 mg/dL — ABNORMAL HIGH (ref 70–99)

## 2021-09-08 MED ORDER — ONDANSETRON 4 MG PO TBDP: 4.0000 mg | ORAL_TABLET | Freq: Four times a day (QID) | ORAL | 0 refills | Status: DC | PRN

## 2021-09-08 MED ORDER — POLYETHYLENE GLYCOL 3350 17 G PO PACK: 17.0000 g | PACK | Freq: Every day | ORAL | 0 refills | Status: DC | PRN

## 2021-09-08 MED ORDER — DOCUSATE SODIUM 100 MG PO CAPS: 100.0000 mg | ORAL_CAPSULE | Freq: Two times a day (BID) | ORAL | 0 refills | Status: DC

## 2021-09-08 MED ORDER — OXYCODONE HCL 5 MG PO TABS: 5.0000 mg | ORAL_TABLET | Freq: Four times a day (QID) | ORAL | 0 refills | Status: DC | PRN

## 2021-09-08 NOTE — Discharge Instructions (Addendum)
[facility] DISCHARGE INSTRUCTIONS  Activity No heavy lifting greater than 15 pounds for 6 weeks after surgery. Ok to shower, but do not bathe or submerge incision underwater. Do not drive while taking narcotic pain medication. Be sure to walk around your home at least 3 times daily. This will help avoid blood clots and will improve your strength.  Wound Care Your incision is covered with skin glue called Dermabond. This will peel off on its own over time. You may shower and allow warm soapy water to run over your incisions. Gently pat dry. Do not submerge your incision underwater. Monitor your incision for any new redness, tenderness, or drainage.  Medications You may take Tylenol up to 4 times daily for pain. If you have more severe pain that is not covered by Tylenol, you may take oxycodone up to every 6 hours as needed. Please reserve this medication only for the most severe pain.  Diet You may resume eating a regular diet as tolerated. Avoid eating excessive amounts of greasy or fatty foods. Avoid drinking a lot of carbonated beverages, as these can worsen symptoms of nausea and reflux. If you are having frequent vomiting after meals or are unable to keep down any food or drink, please call the office right away.  When to Call us: Fever greater than 100.5 New redness, drainage, or swelling at incision site Severe pain, nausea, or vomiting Excessive vomiting or inability to keep down any liquids Jaundice (yellowing of the whites of the eyes or skin)  Follow-up You have an appointment scheduled with Dr. Zenia Resides on September 23, 2021 at 9:30am. This will be at the [facility] Surgery office at [address] [place], [place], [place]. Please arrive at least 15 minutes prior to your scheduled appointment time.  For questions or concerns, please call the office at [phone].

## 2021-09-08 NOTE — Discharge Summary (Signed)
Physician Discharge Summary   Patient ID: Michele Arnold [id number] 60 y.o. 1961-02-05  Admit date: 09/04/2021  Discharge date and time: No discharge date for patient encounter.   Admitting Physician: Dwan Bolt, MD   Discharge Physician: Michaelle Birks, MD  Admission Diagnoses: Gallbladder cancer [facility]) [C23]  Discharge Diagnoses: Gallbladder cancer  Admission Condition: good  Discharged Condition: good  Indication for Admission: Michele Arnold is a 60 yo female who underwent laparoscopic cholecystectomy for cholecystitis on 07/23/21, and pathology incidentally showed a gallbladder cancer in the infundibulum with a positive cystic duct margin. She recovered well from surgery and staging scans showed no evidence of metastatic disease. She was evaluated in clinic and after a discussion of the risks and benefits of surgery agreed to proceed with oncologic resection.  Hospital Course: The patient was taken to the operating room on 09/04/21 for a staging laparoscopy, exploratory laparotomy, partial central hepatectomy and portal lymph node dissection. Postoperatively she was admitted to the progressive care unit in stable condition. She was started on a clear liquid diet, which she tolerated without difficulty. Over the next few days her diet was slowly advanced to full liquids and then a regular diet, which she tolerated without difficulty. She had return of bowel function. Her drain remained serosanguinous and was removed on POD3. Foley was removed and she was able to void spontaneously. She was transferred to a med-surg floor. She worked with physical therapy and was able to ambulate without difficulty. On the morning of POD4 she was ambulating, tolerating a diet, having bowel function, and pain was controlled with oral medications. She was examined and deemed appropriate for discharge home.  Consults: None  Significant Diagnostic Studies: None  Treatments: analgesia:  acetaminophen, Dilaudid, and oxycodone and surgery: Staging laparoscopy, open partial central hepatectomy, portal lymph node sampling  Surgical Pathology: A. CYSTIC DUCT, NEW MARGIN, EXCISION:  -  No adenocarcinoma identified   B. LYMPH NODE, PORTAL, EXCISION:  -  Nodular fat necrosis and fibrosis  -  No nodal tissue identified   C. GALLBLADDER, FOSSA, EXCISION:  -  Benign liver with periductular chronic inflammation and  macrovesicular steatosis  -  No adenocarcinoma identified   D. LYMPH NODE, STATION EIGHT, EXCISION:  -  No adenocarcinoma identified in one lymph node (0/1)   Discharge Exam: General: resting comfortably, NAD Neuro: alert and oriented, no focal deficits Resp: normal work of breathing on room air CV: RRR Abdomen: soft, nondistended, nontender to palpation. Midline incision clean and dry with no erythema or induration. Extremities: warm and well-perfused   Disposition: Discharge disposition: 01-Home or Self Care       Patient Instructions:  Allergies as of 09/08/2021   No Known Allergies      Medication List     TAKE these medications    acetaminophen 325 MG tablet Commonly known as: TYLENOL Take 2 tablets (650 mg total) by mouth every 6 (six) hours as needed for mild pain or fever. What changed: Another medication with the same name was removed. Continue taking this medication, and follow the directions you see here.   docusate sodium 100 MG capsule Commonly known as: COLACE Take 1 capsule (100 mg total) by mouth 2 (two) times daily.   hydrochlorothiazide 12.5 MG capsule Commonly known as: MICROZIDE Take 12.5 mg by mouth daily.   lisinopril 10 MG tablet Commonly known as: ZESTRIL Take 1 tablet (10 mg total) by mouth daily.   oxyCODONE 5 MG immediate release tablet Commonly known as: Oxy IR/ROXICODONE  Take 1 tablet (5 mg total) by mouth every 6 (six) hours as needed for severe pain. What changed: reasons to take this   polyethylene  glycol 17 g packet Commonly known as: MIRALAX / GLYCOLAX Take 17 g by mouth daily as needed for mild constipation.       Activity: no heavy lifting for 6 weeks Diet: regular diet Wound Care: keep wound clean and dry  Follow-up with Dr. Zenia Arnold in 2 weeks.  Signed: Dwan Bolt 09/08/2021 8:14 AM

## 2021-09-09 LAB — BPAM RBC
Blood Product Expiration Date: 202211142359
Blood Product Expiration Date: 202211142359
Blood Product Expiration Date: 202211142359
Blood Product Expiration Date: 202211142359
ISSUE DATE / TIME: 202210170824
ISSUE DATE / TIME: 202210170824
Unit Type and Rh: 5100
Unit Type and Rh: 5100
Unit Type and Rh: 5100
Unit Type and Rh: 5100

## 2021-09-09 LAB — TYPE AND SCREEN
ABO/RH(D): O POS
Antibody Screen: NEGATIVE
Unit division: 0
Unit division: 0
Unit division: 0
Unit division: 0

## 2021-09-15 NOTE — Progress Notes (Signed)
Ms Posa was given her upcoming appt with [facility] on 09/21/2021 at 1100 by Joycelyn Rua in house [place] interpreter.

## 2021-09-20 NOTE — Progress Notes (Addendum)
[place]   [phone] [phone]   Clinic New Consult Note   Patient Care Team: Pcp, No as PCP - General 09/21/2021  CHIEF COMPLAINTS/PURPOSE OF CONSULTATION:  Gallbladder cancer, referred by [facility] surgeon Dr. 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 Cholelithiasis with distended gallbladder. No CT evidence of acute cholecystitis. Consider further evaluation with right upper quadrant ultrasound as clinically indicated. 2. Punctate nonobstructive left nephrolithiasis. 3. Aortic Atherosclerosis (ICD10-I70.0).   07/21/2021 Imaging   ABD [place] RUQ IMPRESSION: 1. Cholelithiasis without evidence of acute cholecystitis. 2. Mild hepatic steatosis.     07/21/2021 Imaging   MRA/MRCP IMPRESSION: 1. No biliary ductal dilation. No choledocholithiasis. 2. Mild hepatic steatosis. 3. Cholelithiasis with dilation of the gallbladder and trace pericholecystic fluid. No gallbladder wall thickening or abnormal wall enhancement. Findings which are equivocal for acute cholecystitis. Consider further evaluation with nuclear medicine HIDA scan to assess cystic duct patency if clinically indicated.   07/22/2021 Imaging   HIDA SCAN IMPRESSION: Scintigraphic findings most consistent with acute cholecystitis.   07/23/2021 Surgery   Preop Dx:        Acute cholecystitis Postop Dx:      Acute cholecystitis with 1 cm stone impacted in the infundibulum Procedure:      Xi robotic cholecystectomy with ICG By Dr. Johnathan Hausen   07/23/2021 Pathology Results   FINAL MICROSCOPIC DIAGNOSIS:  A. GALLBLADDER, CHOLECYSTECTOMY:  - Invasive poorly differentiated adenocarcinoma, 4.3 cm, involving  gallbladder neck  - Carcinoma invades perimuscular soft tissue  - Cystic duct margin is focally positive for carcinoma  - Focally suspicious for lymphovascular invasion   Procedure: Cholecystectomy   Tumor Site: Gallbladder neck  Tumor Size: 4.3 cm  Histologic Type: Adenocarcinoma  Histologic Grade: G3: Poorly differentiated  Tumor Extension: Tumor invades perimuscular connective tissue on the peritoneal side without serosal involvement  Margins:       Margin Status for Invasive Carcinoma: Cystic duct margin is positive for carcinoma  Regional Lymph Nodes: Not applicable (no lymph nodes submitted or found)  Distant Metastasis:       Distant Site(s) Involved: Not applicable  Pathologic Stage Classification (pTNM, AJCC 8th Edition): pT2a, pN not assigned    08/20/2021 Imaging   CT chest IMPRESSION: Negative. No CT evidence for acute intrathoracic abnormality. Negative for pulmonary nodule or evidence for metastatic disease to the chest.   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 Michaelle Birks Staging laparoscopy Exploratory laparotomy with intraoperative cholangiogram Excision of new cystic duct margin Partial central hepatectomy (resection of gallbladder fossa - segments 4b and 5) Portal lymph node dissection   09/04/2021 Pathology Results   FINAL MICROSCOPIC DIAGNOSIS:  A. CYSTIC DUCT, NEW MARGIN, EXCISION:  -  No adenocarcinoma identified  B. LYMPH NODE, PORTAL, EXCISION:  -  Nodular fat necrosis and fibrosis  -  No nodal tissue identified  C. GALLBLADDER, FOSSA, EXCISION:  -  Benign liver with periductular chronic inflammation and  macrovesicular steatosis  -  No adenocarcinoma identified  D. LYMPH NODE, STATION EIGHT, EXCISION:  -  No adenocarcinoma identified in one lymph node (0/1)    09/04/2021 Cancer Staging   Staging form: Gallbladder, AJCC 8th Edition - Clinical stage from 09/04/2021: Stage IIA (cT2a, cN0, cM0) - Signed by Alla Feeling,  NP on 09/21/2021 Stage prefix: Initial diagnosis Total positive nodes: 0 Histologic grade (G):  G3 Histologic grading system: 3 grade system Histologic sub-type: Adenocarcinoma       HISTORY OF PRESENTING ILLNESS:  Michele Arnold 60 y.o. female with no significant past medical history except HTN is here because of newly diagnosed gallbladder cancer.  She initially presented to ED from home on 07/21/2021 for acute upper abdominal pain.  Work-up showed transaminitis AST >1000, ALT 530, alk phos 138, and T bili 3.4.  CBC showed thrombocytosis platelet 446.  She was admitted and underwent extensive lab and clinical work-up.  CT AP 07/21/2021 showed gallstones with distended gallbladder, ultrasound did not show evidence of acute cholecystitis.  MR abdomen/MRCP showed no biliary ductal dilatation, no focal liver lesion.  There was cholelithiasis with dilatation of the gallbladder, no wall thickening or enhancement, findings were equivocal for acute cholecystitis but was subsequently confirmed on HIDA scan 07/22/2021.  She underwent robotic cholecystectomy by Dr. Johnathan Hausen on 07/23/2021.  Path showed incidental finding of invasive poorly differentiated adenocarcinoma spanning 4.3 cm involving the gallbladder neck, invading the perimuscular soft tissue.  The cystic duct margin was positive for carcinoma and there is suspicion for lymphovascular invasion.  This was staged pT2a, lymph nodes were not assigned given the incidental nature.  Postop course was uneventful and she was discharged home on 07/24/2021.  She was seen outpatient by Dr. Michaelle Birks 08/12/2021 and agreed to proceed with subsequent oncologic staging surgery.  CT chest 08/20/2021 was negative for metastatic disease.  CA 19-9 was normal, 8, on 09/01/2021.  She underwent staging laparoscopy, ex lap, excision of cystic duct margin, and partial central hepatectomy for resection of gallbladder fossa as well as portal lymph node dissection on 09/04/2021, path showed no residual carcinoma.  She recovered well and was discharged home  09/08/2021.  Socially, she moved to [place] in 2005, lives with her spouse.  She has 2 healthy daughters.  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  She is unsure of her job status at this point.  She is uninsured.  She drinks alcohol 2-3 times per year on special occasions, denies tobacco or other drug use.  She is overdue for age-appropriate cancer screenings, was scheduled in December but canceled due to this new diagnosis.  She has a family history of cancer in a maternal aunt, unknown type.  Today she presents with her daughter Michele Arnold, a remote [employer] interpreter was available for the duration of today's visit.  She is recovering well from surgery, ambulating and doing her normal activities.  She has mild intermittent "pinching and stabbing" pain adequately managed with Tylenol.  Appetite is normal, bowels moving well.  Denies nausea/vomiting, fever, chills, cough, chest pain, dyspnea, leg edema, or any other specific complaints.  MEDICAL HISTORY:  Past Medical History:  Diagnosis Date   Cancer [facility])    Hypertension     SURGICAL HISTORY: Past Surgical History:  Procedure Laterality Date   CHOLECYSTECTOMY  07/23/2021   INTRAOPERATIVE CHOLANGIOGRAM N/A 09/04/2021   Procedure: INTRAOPERATIVE CHOLANGIOGRAM;  Surgeon: Dwan Bolt, MD;  Location: [facility];  Service: General;  Laterality: N/A;   LAPAROSCOPY N/A 09/04/2021   Procedure: STAGING LAPAROSCOPY;  Surgeon: Dwan Bolt, MD;  Location: [facility];  Service: General;  Laterality: N/A;   LYMPH NODE DISSECTION N/A 09/04/2021   Procedure: PORTAL LYMPH NODE DISSECTION;  Surgeon: Dwan Bolt, MD;  Location: [facility];  Service: General;  Laterality: N/A;   OPEN HEPATECTOMY  N/A 09/04/2021   Procedure: OPEN PARTIAL CENTRAL HEPATECTOMY;  Surgeon: Dwan Bolt, MD;  Location: [place] [facility];  Service: General;  Laterality: N/A;    SOCIAL HISTORY: Social History   Socioeconomic History   Marital status:  Married    Spouse name: Not on file   Number of children: Not on file   Years of education: Not on file   Highest education level: Not on file  Occupational History   Not on file  Tobacco Use   Smoking status: Never   Smokeless tobacco: Never  Vaping Use   Vaping Use: Never used  Substance and Sexual Activity   Alcohol use: Not Currently    Comment: 2-3 times per year, special occasion   Drug use: Never   Sexual activity: Not on file  Other Topics Concern   Not on file  Social History Narrative   Not on file   Social Determinants of Health   Financial Resource Strain: Not on file  Food Insecurity: Not on file  Transportation Needs: Not on file  Physical Activity: Not on file  Stress: Not on file  Social Connections: Not on file  Intimate Partner Violence: Not on file    FAMILY HISTORY: Family History  Problem Relation Age of Onset   Cancer Maternal Aunt        unknown type    ALLERGIES:  has No Known Allergies.  MEDICATIONS:  Current Outpatient Medications  Medication Sig Dispense Refill   acetaminophen (TYLENOL) 325 MG tablet Take 2 tablets (650 mg total) by mouth every 6 (six) hours as needed for mild pain or fever.     hydrochlorothiazide (MICROZIDE) 12.5 MG capsule Take 12.5 mg by mouth daily.     lisinopril (ZESTRIL) 10 MG tablet Take 1 tablet (10 mg total) by mouth daily. 30 tablet 0   No current facility-administered medications for this visit.    REVIEW OF SYSTEMS:   Constitutional: Denies fevers, chills or abnormal night sweats (+) 15 lbs weight loss since initial surgery Eyes: Denies blurriness of vision, double vision or watery eyes Ears, nose, mouth, throat, and face: Denies mucositis or sore throat Respiratory: Denies cough, dyspnea or wheezes Cardiovascular: Denies palpitation, chest discomfort or lower extremity swelling Gastrointestinal:  Denies nausea, vomiting, constipation, diarrhea, hematochezia, heartburn or change in bowel habits (+)  mild intermittent incisional pain Skin: Denies abnormal skin rashes Lymphatics: Denies new lymphadenopathy or easy bruising Neurological:Denies numbness, tingling or new weaknesses Behavioral/Psych: Mood is stable, no new changes  All other systems were reviewed with the patient and are negative.  PHYSICAL EXAMINATION: ECOG PERFORMANCE STATUS: 1 - Symptomatic but completely ambulatory  Vitals:   09/21/21 1050  BP: 138/65  Pulse: 75  Resp: 16  Temp: 98.8 F (37.1 C)  SpO2: 100%   Filed Weights   09/21/21 1050  Weight: 154 lb 8 oz (70.1 kg)    GENERAL:alert, no distress and comfortable SKIN: no rash  EYES: sclera clear NECK: without mass LYMPH:  no palpable cervical or supraclavicular lymphadenopathy LUNGS: clear with normal breathing effort HEART: regular rate & rhythm, no lower extremity edema ABDOMEN:abdomen soft and normal bowel sounds.  Multiple laparoscopic and midline incisions healing well, closed without erythema or drainage.  Mild TTP in the RLQ lap incision Musculoskeletal:no cyanosis of digits and no clubbing  PSYCH: alert & oriented x 3 with fluent speech NEURO: no focal motor/sensory deficits  LABORATORY DATA:  I have reviewed the data as listed  CBC Latest Ref Rng & Units 09/21/2021 09/06/2021 09/05/2021  WBC 4.0 - 10.5 K/uL 6.1 10.3 10.9(H)  Hemoglobin 12.0 - 15.0 g/dL 12.9 11.2(L) 11.4(L)  Hematocrit 36.0 - 46.0 % 39.0 34.0(L) 34.0(L)  Platelets 150 - 400 K/uL 649(H) 259 289   CMP Latest Ref Rng & Units 09/07/2021 09/06/2021 09/05/2021  Glucose 70 - 99 mg/dL 177(H) 113(H) 123(H)  BUN 6 - 20 mg/dL <5(L) 6 9  Creatinine 0.44 - 1.00 mg/dL 0.79 0.69 0.68  Sodium 135 - 145 mmol/L 138 139 137  Potassium 3.5 - 5.1 mmol/L 3.7 3.7 4.3  Chloride 98 - 111 mmol/L 104 108 109  CO2 22 - 32 mmol/L _0 Calcium 8.9 - 10.3 mg/dL 9.0 8.3(L) 8.7(L)  Total Protein 6.5 - 8.1 g/dL 6.8 5.9(L) 6.0(L)  Total Bilirubin 0.3 - 1.2 mg/dL 1.4(H) 1.8(H) 1.8(H)  Alkaline Phos  38 - 126 U/L 77 63 62  AST 15 - 41 U/L 60(H) 101(H) 169(H)  ALT 0 - 44 U/L 95(H) 120(H) 126(H)     RADIOGRAPHIC STUDIES: I have personally reviewed the radiological images as listed and agreed with the findings in the report. DG Cholangiogram Operative  Result Date: 09/04/2021 CLINICAL DATA:  Gallbladder adenocarcinoma from previous cholecystectomy performed 07/23/2021. EXAM: INTRAOPERATIVE CHOLANGIOGRAM TECHNIQUE: Cholangiographic images from the C-arm fluoroscopic device were submitted for interpretation post-operatively. Please see the procedural report for the amount of contrast and the fluoroscopy time utilized. COMPARISON:  None. FINDINGS: Intraoperative cholangiogram performed during the open surgical procedure. Catheter within the residual cystic duct. There is leakage of contrast at the injection site. Residual cystic duct, biliary confluence, common hepatic duct, and common bile duct are patent. Contrast easily drains into the duodenum. No biliary obstruction pattern, severe stricture, or large filling defect. IMPRESSION: Patent biliary tree. Electronically Signed   By: Jerilynn Mages.  Shick M.D.   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  Work-up consistent with acute cholecystitis. On cholecystectomy by Dr. Hassell Done 07/23/21, she had incidental finding of poorly differentiated adenocarcinoma in the gallbladder neck, invading perimuscular soft tissue with a positive cystic duct margin.  -Staging work-up was negative for metastatic disease, baseline CA 19-9 was normal -She subsequently underwent oncologic staging surgery, resection of positive margin, and portal lymph node dissection Dr. Zenia Resides on 09/04/2021, there is no  residual carcinoma.  Overall pT2a N0 M0  -She understands her cancer was resected, any subsequent treatment is preventative.   -We discussed the moderate recurrence risk for stage II gallbladder cancer.  We recommend adjuvant chemo with Xeloda taken twice daily on days 1 - 14 q. 21 days for 6 months to reduce her recurrence risk, followed by surveillance.  Given the margin was negative on second surgery, adjuvant radiation is not recommended -Chemotherapy consent: Side effects including but not limited to fatigue, nausea/vomiting, diarrhea, hand-foot syndrome, rash, vasospasm, fluid retention, renal and kidney dysfunction, neutropenic fever, need for blood transfusion, bleeding, were discussed with patient in great detail. She agrees to proceed. Goal of adjuvant chemotherapy is curative.  -she is young with no significant past medical history. She has family support. She is a good candidate for chemotherapy.  -[name] appears stable. She is recovering well from surgeries. Incisions healing well. She is ambulatory with good PS,  pain is well managed.  She can start chemo in 1-2 weeks -We briefly discussed the surveillance plan after chemo, which includes lab and physical exam every 3-4 months for the first 1-2 years, then 6-12 months for at least 5 years with CT every 6 months for the first year, then annually for couple more years. -Patient is uninsured, I referred her to SW/financial for assistance. Once that is determined and we have a chemo start date, we will arrange lab/follow up to see her back on week 2 of first cycle. -She understands she will get a call from our oral pharmacist to review Xeloda instructions  Abdominal pain, transaminitis, hyperbilirubinemia -secondary to #1. On 07/21/21 AST 1006, ALT 530, alk phos 138, Tbili 3.4 -labs and pain improved after surgeries -mild incisional pain managed with tylenol PRN -will repeat labs today, no clinical signs of juandice    Hypertension -On lisinopril and HCTZ, prescribed by Eulogio Bear, DO  Health maintenance, disease prevention -States she was scheduled for cancer screenings in December but canceled due to her new cancer diagnosis.  She is overdue -I encouraged her to stay up-to-date on age-appropriate cancer screenings, vaccines, and live healthy active lifestyle  5. Social -She began a new job 1 week prior to her illness, she states she still has a job but not sure what her role will be when she returns -Lives with spouse, has 2 daughters.  Michele Arnold is present for today's consult.  She has good family support -Uninsured, I have referred her to social work/financial for co-pay assistance with Xeloda  PLAN: -Work up reviewed, stage II gallbladder cancer s/p resection 09/04/21 -plan to begin adjuvant Xeloda BID 2 weeks on/1 week off x6 months, starting in 1-2 weeks -Urgent referral to SW/financial for drug assistance -Lab today -lab and f/up on week 2 of cycle 1   Orders Placed This Encounter  Procedures   CBC with Differential ([facility] Only)    Standing Status:   Standing    Number of Occurrences:   50    Standing Expiration Date:   09/21/2022   CMP ([facility] only)    Standing Status:   Standing    Number of Occurrences:   50    Standing Expiration Date:   09/21/2022   CEA (IN HOUSE-[facility])FOR [facility] ONLY    Standing Status:   Standing    Number of Occurrences:   1    Standing Expiration Date:   09/21/2022   Ambulatory referral to Social Work    Referral Priority:   Routine    Referral Type:   Consultation    Referral Reason:   Specialty Services Required    Number of Visits Requested:   1    All questions were answered. The patient knows to call the clinic with any problems, questions or concerns.     Alla Feeling, NP 09/21/2021    Addendum  I have seen the patient, examined her. I agree with the assessment and and plan and have edited the notes.   60 year old female  presented with incidental finding of gallbladder cancer.  She is status post second surgery for positive ductal margin and lymph node biopsy.  I have reviewed her surgical pathology, and discussed the finding with patient.  Although this is early stage gallbladder cancer, she still has high risk for recurrence. Based on the BILCAP study, and NCCN guideline, I recommend adjuvant Xeloda 1584m am, 20077mpm, 2 weeks on and 1 week off for 6  months.  Potential benefit and side effects discussed with her, she agrees to proceed.  She has recovered well from surgery, plan to proceed when she receives the medicine.  She is uninsured, will get our financial advocate and social worker involved, to help her get the oral chemo. We also reviewed cancer surveillance. All questions were answered.  Truitt Merle  09/21/2021

## 2021-09-21 ENCOUNTER — Inpatient Hospital Stay: Payer: Self-pay | Attending: Nurse Practitioner | Admitting: Nurse Practitioner

## 2021-09-21 ENCOUNTER — Inpatient Hospital Stay: Payer: Self-pay

## 2021-09-21 ENCOUNTER — Other Ambulatory Visit: Payer: Self-pay

## 2021-09-21 ENCOUNTER — Encounter: Payer: Self-pay | Admitting: Nurse Practitioner

## 2021-09-21 VITALS — BP 138/65 | HR 75 | Temp 98.8°F | Resp 16 | Wt 154.5 lb

## 2021-09-21 DIAGNOSIS — Z9049 Acquired absence of other specified parts of digestive tract: Secondary | ICD-10-CM | POA: Insufficient documentation

## 2021-09-21 DIAGNOSIS — C23 Malignant neoplasm of gallbladder: Secondary | ICD-10-CM | POA: Insufficient documentation

## 2021-09-21 DIAGNOSIS — R109 Unspecified abdominal pain: Secondary | ICD-10-CM | POA: Insufficient documentation

## 2021-09-21 DIAGNOSIS — K760 Fatty (change of) liver, not elsewhere classified: Secondary | ICD-10-CM | POA: Insufficient documentation

## 2021-09-21 DIAGNOSIS — K8000 Calculus of gallbladder with acute cholecystitis without obstruction: Secondary | ICD-10-CM | POA: Insufficient documentation

## 2021-09-21 DIAGNOSIS — I10 Essential (primary) hypertension: Secondary | ICD-10-CM | POA: Insufficient documentation

## 2021-09-21 DIAGNOSIS — N200 Calculus of kidney: Secondary | ICD-10-CM | POA: Insufficient documentation

## 2021-09-21 DIAGNOSIS — R7401 Elevation of levels of liver transaminase levels: Secondary | ICD-10-CM | POA: Insufficient documentation

## 2021-09-21 DIAGNOSIS — Z79899 Other long term (current) drug therapy: Secondary | ICD-10-CM | POA: Insufficient documentation

## 2021-09-21 LAB — CBC WITH DIFFERENTIAL (CANCER CENTER ONLY)
Abs Immature Granulocytes: 0 10*3/uL (ref 0.00–0.07)
Basophils Absolute: 0 10*3/uL (ref 0.0–0.1)
Basophils Relative: 1 %
Eosinophils Absolute: 0.1 10*3/uL (ref 0.0–0.5)
Eosinophils Relative: 2 %
HCT: 39 % (ref 36.0–46.0)
Hemoglobin: 12.9 g/dL (ref 12.0–15.0)
Immature Granulocytes: 0 %
Lymphocytes Relative: 29 %
Lymphs Abs: 1.8 10*3/uL (ref 0.7–4.0)
MCH: 31.5 pg (ref 26.0–34.0)
MCHC: 33.1 g/dL (ref 30.0–36.0)
MCV: 95.4 fL (ref 80.0–100.0)
Monocytes Absolute: 0.4 10*3/uL (ref 0.1–1.0)
Monocytes Relative: 6 %
Neutro Abs: 3.8 10*3/uL (ref 1.7–7.7)
Neutrophils Relative %: 62 %
Platelet Count: 649 10*3/uL — ABNORMAL HIGH (ref 150–400)
RBC: 4.09 MIL/uL (ref 3.87–5.11)
RDW: 12.1 % (ref 11.5–15.5)
WBC Count: 6.1 10*3/uL (ref 4.0–10.5)
nRBC: 0 % (ref 0.0–0.2)

## 2021-09-21 LAB — CMP (CANCER CENTER ONLY)
ALT: 31 U/L (ref 0–44)
AST: 29 U/L (ref 15–41)
Albumin: 3.9 g/dL (ref 3.5–5.0)
Alkaline Phosphatase: 112 U/L (ref 38–126)
Anion gap: 8 (ref 5–15)
BUN: 10 mg/dL (ref 6–20)
CO2: 25 mmol/L (ref 22–32)
Calcium: 9.7 mg/dL (ref 8.9–10.3)
Chloride: 108 mmol/L (ref 98–111)
Creatinine: 0.75 mg/dL (ref 0.44–1.00)
GFR, Estimated: >60 mL/min (ref >60)
Glucose, Bld: 106 mg/dL — ABNORMAL HIGH (ref 70–99)
Potassium: 4 mmol/L (ref 3.5–5.1)
Sodium: 141 mmol/L (ref 135–145)
Total Bilirubin: 0.7 mg/dL (ref 0.3–1.2)
Total Protein: 8 g/dL (ref 6.5–8.1)

## 2021-09-21 LAB — CEA (IN HOUSE-CHCC): CEA (CHCC-In House): <1 ng/mL (ref 0.00–5.00)

## 2021-09-21 MED ORDER — CAPECITABINE 500 MG PO TABS
ORAL_TABLET | ORAL | 0 refills | Status: DC
  Filled 2021-09-21: qty 98, fill #0
  Filled 2021-09-30: qty 98, 21d supply, fill #0

## 2021-09-21 NOTE — Progress Notes (Signed)
I met with Michele Arnold and her daughter, Michele Arnold during her consultation with Cira Rue, NP and Dr Burr Medico.  I explained my role as nurse navigator and provided my contact information.  I told Michele Arnold that our Education officer, museum department and financial advocates will reach out to them regarding any financial assistance.  Michele Arnold verbalized understanding.

## 2021-09-22 ENCOUNTER — Other Ambulatory Visit: Payer: Self-pay | Admitting: Hematology

## 2021-09-22 ENCOUNTER — Other Ambulatory Visit (HOSPITAL_COMMUNITY): Payer: Self-pay

## 2021-09-22 ENCOUNTER — Telehealth: Payer: Self-pay | Admitting: Pharmacist

## 2021-09-22 DIAGNOSIS — C23 Malignant neoplasm of gallbladder: Secondary | ICD-10-CM

## 2021-09-22 MED ORDER — PROCHLORPERAZINE MALEATE 10 MG PO TABS: 10.0000 mg | ORAL_TABLET | Freq: Four times a day (QID) | ORAL | 1 refills | Status: DC | PRN

## 2021-09-22 NOTE — Telephone Encounter (Signed)
Oral Oncology Pharmacist Encounter  Received new prescription for Xeloda (capecitabine) for the adjuvant treatment of stage IIA gallbladder cancer, planned duration 6 months.  CBC w/ Diff and CMP from 09/21/21 assessed, no relevant lab abnormalities noted. Prescription dose and frequency assessed for appropriateness. Appropriate for therapy initiation.   Current medication list in Epic reviewed, no relevant/significant DDIs with Xeloda identified.  Evaluated chart and no patient barriers to medication adherence noted.   Patient agreement for treatment documented in MD note on 09/21/21.  Prescription has been e-scribed to the [facility] for benefits analysis and approval.  Oral Oncology Clinic will continue to follow for insurance authorization, copayment issues, initial counseling and start date.  Leron Croak, PharmD, BCPS Hematology/Oncology Clinical Pharmacist [facility] [phone] 09/22/2021 7:57 AM

## 2021-09-23 ENCOUNTER — Telehealth: Payer: Self-pay

## 2021-09-23 NOTE — Telephone Encounter (Signed)
Attempted to call pt via interpreter to review lab results per per Cira Rue, NP. Pt did not answer, unable to lave VM.

## 2021-09-24 ENCOUNTER — Encounter: Payer: Self-pay | Admitting: Hematology

## 2021-09-24 NOTE — Telephone Encounter (Signed)
Pt called again this AM and vis interpreter from [employer] [employer] pt was made aware of lab results per Cira Rue, NP. Pt had no further questions at the time of the call.

## 2021-09-30 ENCOUNTER — Encounter: Payer: Self-pay | Admitting: Hematology

## 2021-09-30 ENCOUNTER — Other Ambulatory Visit (HOSPITAL_COMMUNITY): Payer: Self-pay

## 2021-09-30 NOTE — Telephone Encounter (Signed)
Oral Chemotherapy Pharmacist Encounter  Xeloda will be delivered 10/01/21. Patient will start in the evening.   I spoke with patient for overview of: Xeloda (capecitabine) for the treatment of stage IIA gallbladder cancer, planned duration 6 months.  Counseled patient on administration, dosing, side effects, monitoring, drug-food interactions, safe handling, storage, and disposal.  Patient will take Xeloda 500mg  tablets, 3 tablets (1500 mg) by mouth in AM and 4 tabs (2000 mg) by mouth in PM, within 30 minutes of finishing meals, for 14 days on, 7 days off, repeated every 21 days.  Xeloda start date: 10/01/21  Adverse effects include but are not limited to: fatigue, decreased blood counts, GI upset, diarrhea, mouth sores, and hand-foot syndrome.  Patient has anti-emetic on hand and knows to take it if nausea develops.   Patient will obtain anti diarrheal and alert the office of 4 or more loose stools above baseline.  Reviewed with patient importance of keeping a medication schedule and plan for any missed doses. No barriers to medication adherence identified.  Medication reconciliation performed and medication/allergy list updated.  This will ship from the [facility] on 09/21/21 to deliver to patient's home on 10/01/21.  Patient informed the pharmacy will reach out 5-7 days prior to needing next fill of Xeloda to coordinate continued medication acquisition to prevent break in therapy.  All questions answered.  Michele Arnold voiced understanding and appreciation.   Medication education handout placed in mail for patient. Patient knows to call the office with questions or concerns. Oral Chemotherapy Clinic phone number provided to patient.   Benn Moulder, PharmD Pharmacy Resident  09/30/2021 2:45 PM

## 2021-09-30 NOTE — Progress Notes (Signed)
Pt is approved for the $1000 [employer] grant.  

## 2021-10-01 ENCOUNTER — Telehealth: Payer: Self-pay

## 2021-10-01 NOTE — Telephone Encounter (Signed)
Returned pt's call as to when to start taking her Xeloda.  Pt and interpreter stated they spoke with Dr. Burr Medico and she re-educated her on how to take her medication and when to start.  Dr. Burr Medico, instructed pt to start taking the Xeloda on Friday, 10/02/2021 since the pt just received the medication this afternoon in the mail.

## 2021-10-07 ENCOUNTER — Other Ambulatory Visit: Payer: Self-pay

## 2021-10-07 ENCOUNTER — Ambulatory Visit: Payer: Self-pay | Admitting: Nurse Practitioner

## 2021-10-11 NOTE — Progress Notes (Signed)
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 Cholelithiasis with distended gallbladder. No CT evidence of acute cholecystitis. Consider further evaluation with right upper quadrant ultrasound as clinically indicated. 2. Punctate nonobstructive left nephrolithiasis. 3. Aortic Atherosclerosis (ICD10-I70.0).   07/21/2021 Imaging   ABD [place] RUQ IMPRESSION: 1. Cholelithiasis without evidence of acute cholecystitis. 2. Mild hepatic steatosis.     07/21/2021 Imaging   MRA/MRCP IMPRESSION: 1. No biliary ductal dilation. No choledocholithiasis. 2. Mild hepatic steatosis. 3. Cholelithiasis with dilation of the gallbladder and trace pericholecystic fluid. No gallbladder wall thickening or abnormal wall enhancement. Findings which are equivocal for acute cholecystitis. Consider further evaluation with nuclear medicine HIDA scan to assess cystic duct patency if clinically indicated.   07/22/2021 Imaging   HIDA SCAN IMPRESSION: Scintigraphic findings most consistent with acute cholecystitis.   07/23/2021 Surgery   Preop Dx:        Acute cholecystitis Postop Dx:      Acute cholecystitis with 1 cm stone impacted in the infundibulum Procedure:      Xi robotic cholecystectomy with ICG By Dr. Johnathan Hausen   07/23/2021 Pathology Results   FINAL MICROSCOPIC DIAGNOSIS:  A. GALLBLADDER, CHOLECYSTECTOMY:  - Invasive poorly differentiated adenocarcinoma, 4.3 cm, involving  gallbladder neck  - Carcinoma invades perimuscular soft tissue  - Cystic duct margin is focally positive for carcinoma  - Focally suspicious for lymphovascular invasion   Procedure: Cholecystectomy  Tumor Site: Gallbladder neck  Tumor Size: 4.3 cm   Histologic Type: Adenocarcinoma  Histologic Grade: G3: Poorly differentiated  Tumor Extension: Tumor invades perimuscular connective tissue on the peritoneal side without serosal involvement  Margins:       Margin Status for Invasive Carcinoma: Cystic duct margin is positive for carcinoma  Regional Lymph Nodes: Not applicable (no lymph nodes submitted or found)  Distant Metastasis:       Distant Site(s) Involved: Not applicable  Pathologic Stage Classification (pTNM, AJCC 8th Edition): pT2a, pN not assigned    08/20/2021 Imaging   CT chest IMPRESSION: Negative. No CT evidence for acute intrathoracic abnormality. Negative for pulmonary nodule or evidence for metastatic disease to the chest.   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 Michaelle Birks Staging laparoscopy Exploratory laparotomy with intraoperative cholangiogram Excision of new cystic duct margin Partial central hepatectomy (resection of gallbladder fossa - segments 4b and 5) Portal lymph node dissection   09/04/2021 Pathology Results   FINAL MICROSCOPIC DIAGNOSIS:  A. CYSTIC DUCT, NEW MARGIN, EXCISION:  -  No adenocarcinoma identified  B. LYMPH NODE, PORTAL, EXCISION:  -  Nodular fat necrosis and fibrosis  -  No nodal tissue identified  C. GALLBLADDER, FOSSA, EXCISION:  -  Benign liver with periductular chronic inflammation and  macrovesicular steatosis  -  No adenocarcinoma identified  D. LYMPH NODE, STATION EIGHT, EXCISION:  -  No adenocarcinoma identified in one lymph node (0/1)    09/04/2021 Cancer Staging   Staging form: Gallbladder, AJCC 8th Edition - Clinical stage from 09/04/2021: Stage IIA (cT2a, cN0, cM0) - Signed by Alla Feeling, NP on 09/21/2021 Stage prefix: Initial  diagnosis Total positive nodes: 0 Histologic grade (G): G3 Histologic grading system: 3 grade  system Histologic sub-type: Adenocarcinoma    09/28/2021 -  Chemotherapy   Patient is on Treatment Plan : BREAST Capecitabine q21d       CURRENT THERAPY: Adjuvant Xeloda 1500 mg AM/2000 mg PM days 1-14 q21 days, x6 months, starting 10/02/21.   INTERVAL HISTORY: Ms. Whitmore returns as scheduled. She began adjuvant Xeloda 11/18, takes 1500 mg AM and 2000 mg p.m. with 2 days remaining.  She feels well, no changes.  He has some incisional tenderness especially when something rubs up against her abdomen.  She has decent appetite, eats a little in the morning then more throughout the day.  Denies mucositis, nausea/vomiting, constipation, diarrhea, abdominal pain or bloating, hand/foot syndrome, rash, fever, chills, cough, chest pain, dyspnea, or any other new complaints.  She is scheduled to see Dr. Zenia Resides tomorrow.   MEDICAL HISTORY:  Past Medical History:  Diagnosis Date   Cancer [facility])    Hypertension     SURGICAL HISTORY: Past Surgical History:  Procedure Laterality Date   CHOLECYSTECTOMY  07/23/2021   INTRAOPERATIVE CHOLANGIOGRAM N/A 09/04/2021   Procedure: INTRAOPERATIVE CHOLANGIOGRAM;  Surgeon: Dwan Bolt, MD;  Location: [place];  Service: General;  Laterality: N/A;   LAPAROSCOPY N/A 09/04/2021   Procedure: STAGING LAPAROSCOPY;  Surgeon: Dwan Bolt, MD;  Location: [place];  Service: General;  Laterality: N/A;   LYMPH NODE DISSECTION N/A 09/04/2021   Procedure: PORTAL LYMPH NODE DISSECTION;  Surgeon: Dwan Bolt, MD;  Location: [place];  Service: General;  Laterality: N/A;   OPEN HEPATECTOMY  N/A 09/04/2021   Procedure: OPEN PARTIAL CENTRAL HEPATECTOMY;  Surgeon: Dwan Bolt, MD;  Location: [place];  Service: General;  Laterality: N/A;    I have reviewed the social history and family history with the patient and they are unchanged from previous note.  ALLERGIES:  has No Known Allergies.  MEDICATIONS:  Current Outpatient Medications  Medication Sig Dispense  Refill   acetaminophen (TYLENOL) 325 MG tablet Take 2 tablets (650 mg total) by mouth every 6 (six) hours as needed for mild pain or fever.     capecitabine (XELODA) 500 MG tablet Take 3 tabs in morning and 4 tabs in evening,every 12 hours,on day 1-14 every 21 days. 98 tablet 0   hydrochlorothiazide (MICROZIDE) 12.5 MG capsule Take 12.5 mg by mouth daily.     lisinopril (ZESTRIL) 10 MG tablet Take 1 tablet (10 mg total) by mouth daily. 30 tablet 0   prochlorperazine (COMPAZINE) 10 MG tablet Take 10 mg by mouth every 6 (six) hours as needed. (Patient not taking: Reported on 10/13/2021)     No current facility-administered medications for this visit.    PHYSICAL EXAMINATION: ECOG PERFORMANCE STATUS: 0 - Asymptomatic  Vitals:   10/13/21 0950  BP: (!) 152/80  Pulse: 88  Resp: 18  Temp: 98.8 F (37.1 C)  SpO2: 98%   Filed Weights   10/13/21 0950  Weight: 158 lb 8 oz (71.9 kg)    GENERAL:alert, no distress and comfortable SKIN: No rash.  Palms without erythema  EYES: sclera clear OROPHARYNX: No thrush or ulcers LUNGS: clear anteriorly with normal breathing effort HEART: regular rate & rhythm, no lower extremity edema ABDOMEN:abdomen soft, non-tender and normal bowel sounds.  Incisions well-healed with glue remnants NEURO: alert & oriented x 3 with fluent speech, no focal motor/sensory deficits  LABORATORY DATA:  I have reviewed the data as listed CBC  Latest Ref Rng & Units 10/13/2021 09/21/2021 09/06/2021  WBC 4.0 - 10.5 K/uL 5.6 6.1 10.3  Hemoglobin 12.0 - 15.0 g/dL 13.0 12.9 11.2(L)  Hematocrit 36.0 - 46.0 % 39.1 39.0 34.0(L)  Platelets 150 - 400 K/uL 366 649(H) 259     CMP Latest Ref Rng & Units 10/13/2021 09/21/2021 09/07/2021  Glucose 70 - 99 mg/dL 97 106(H) 177(H)  BUN 6 - 20 mg/dL 16 10 <5(L)  Creatinine 0.44 - 1.00 mg/dL 0.75 0.75 0.79  Sodium 135 - 145 mmol/L 139 141 138  Potassium 3.5 - 5.1 mmol/L 3.7 4.0 3.7  Chloride 98 - 111 mmol/L 105 108 104  CO2 22 - 32  mmol/L 26 25 24   Calcium 8.9 - 10.3 mg/dL 9.4 9.7 9.0  Total Protein 6.5 - 8.1 g/dL 7.8 8.0 6.8  Total Bilirubin 0.3 - 1.2 mg/dL 1.5(H) 0.7 1.4(H)  Alkaline Phos 38 - 126 U/L 81 112 77  AST 15 - 41 U/L 23 29 60(H)  ALT 0 - 44 U/L 22 31 95(H)      RADIOGRAPHIC STUDIES: I have personally reviewed the radiological images as listed and agreed with the findings in the report. No results found.   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  Work-up consistent with acute cholecystitis. On cholecystectomy by Dr. Hassell Done 07/23/21, she had incidental finding of poorly differentiated adenocarcinoma in the gallbladder neck, invading perimuscular soft tissue with a positive cystic duct margin.  -Staging work-up was negative for metastatic disease, baseline CA 19-9 was normal -She subsequently underwent oncologic staging surgery, resection of positive margin, and portal lymph node dissection Dr. Zenia Resides on 09/04/2021, there is no residual carcinoma.  Overall pT2a N0 M0  -Began adjuvant Xeloda 1500 mg a.m./2000 mg p.m. for 2 weeks on/1 week off, plan for 6 months, starting 10/02/2021 -Ms. Rosario-Giacobbe appears stable, she is tolerating Xeloda very well with no significant side effects thus far.  She will complete 2 weeks on 12/1, then off for 7 days -She will contact the pharmacy to arrange delivery -Labs reviewed, CBC normal, CMP normal except T bili 1.5.  Abdominal exam is benign.  This is possibly from Xeloda given that it normalized after surgery and is slightly elevated again.  We will monitor closely -Plan to begin cycle 2 Xeloda on 12/9.  Follow-up in 4 weeks which will be her week off prior to cycle 3   Abdominal pain, transaminitis, hyperbilirubinemia -secondary to #1. On 07/21/21 AST 1006, ALT 530, alk phos 138, Tbili 3.4 -labs  and pain improved after surgeries -mild incisional pain managed with tylenol PRN -T bili normalized after surgery, slightly elevated today to 1.5, possibly related to Xeloda   Hypertension -On lisinopril and HCTZ, prescribed by Eulogio Bear, DO   Health maintenance, disease prevention -States she was scheduled for cancer screenings in December but canceled due to her new cancer diagnosis.  She is overdue -I encouraged her to stay up-to-date on age-appropriate cancer screenings, vaccines, and live healthy active lifestyle   5. Social -She began a new job 1 week prior to her illness, she states she still has a job but not sure what her role will be when she returns -Lives with spouse, has 2 daughters. She has good family support -Followed by social work/financial for co-pay assistance with Xeloda  Plan: -Currently cycle 1 day 12 of Xeloda, tolerating well with no  significant side effects -Labs reviewed, adequate to complete the current cycle on 12/1, then 7 days off -Begin cycle 2 Xeloda on 12/9, same dose -Return for lab and follow-up in 4 weeks prior to cycle 3  All questions were answered. The patient knows to call the clinic with any problems, questions or concerns. No barriers to learning were detected with Spanish interpreter present for the entire encounter.     Alla Feeling, NP 10/13/21

## 2021-10-12 ENCOUNTER — Encounter: Payer: Self-pay | Admitting: Hematology

## 2021-10-12 ENCOUNTER — Other Ambulatory Visit: Payer: Self-pay | Admitting: Hematology

## 2021-10-12 ENCOUNTER — Other Ambulatory Visit (HOSPITAL_COMMUNITY): Payer: Self-pay

## 2021-10-12 MED ORDER — CAPECITABINE 500 MG PO TABS
ORAL_TABLET | ORAL | 0 refills | Status: DC
  Filled 2021-10-12: qty 98, fill #0
  Filled 2021-10-12: qty 98, 21d supply, fill #0

## 2021-10-13 ENCOUNTER — Inpatient Hospital Stay (HOSPITAL_BASED_OUTPATIENT_CLINIC_OR_DEPARTMENT_OTHER): Payer: Self-pay | Admitting: Nurse Practitioner

## 2021-10-13 ENCOUNTER — Other Ambulatory Visit: Payer: Self-pay

## 2021-10-13 ENCOUNTER — Encounter: Payer: Self-pay | Admitting: Nurse Practitioner

## 2021-10-13 ENCOUNTER — Inpatient Hospital Stay: Payer: Self-pay

## 2021-10-13 VITALS — BP 152/80 | HR 88 | Temp 98.8°F | Resp 18 | Ht 64.0 in | Wt 158.5 lb

## 2021-10-13 DIAGNOSIS — C23 Malignant neoplasm of gallbladder: Secondary | ICD-10-CM

## 2021-10-13 LAB — CBC WITH DIFFERENTIAL (CANCER CENTER ONLY)
Abs Immature Granulocytes: 0.01 10*3/uL (ref 0.00–0.07)
Basophils Absolute: 0 10*3/uL (ref 0.0–0.1)
Basophils Relative: 0 %
Eosinophils Absolute: 0.2 10*3/uL (ref 0.0–0.5)
Eosinophils Relative: 3 %
HCT: 39.1 % (ref 36.0–46.0)
Hemoglobin: 13 g/dL (ref 12.0–15.0)
Immature Granulocytes: 0 %
Lymphocytes Relative: 34 %
Lymphs Abs: 1.9 10*3/uL (ref 0.7–4.0)
MCH: 31.3 pg (ref 26.0–34.0)
MCHC: 33.2 g/dL (ref 30.0–36.0)
MCV: 94.2 fL (ref 80.0–100.0)
Monocytes Absolute: 0.4 10*3/uL (ref 0.1–1.0)
Monocytes Relative: 7 %
Neutro Abs: 3.1 10*3/uL (ref 1.7–7.7)
Neutrophils Relative %: 56 %
Platelet Count: 366 10*3/uL (ref 150–400)
RBC: 4.15 MIL/uL (ref 3.87–5.11)
RDW: 12.6 % (ref 11.5–15.5)
WBC Count: 5.6 10*3/uL (ref 4.0–10.5)
nRBC: 0 % (ref 0.0–0.2)

## 2021-10-13 LAB — CMP (CANCER CENTER ONLY)
ALT: 22 U/L (ref 0–44)
AST: 23 U/L (ref 15–41)
Albumin: 4.2 g/dL (ref 3.5–5.0)
Alkaline Phosphatase: 81 U/L (ref 38–126)
Anion gap: 8 (ref 5–15)
BUN: 16 mg/dL (ref 6–20)
CO2: 26 mmol/L (ref 22–32)
Calcium: 9.4 mg/dL (ref 8.9–10.3)
Chloride: 105 mmol/L (ref 98–111)
Creatinine: 0.75 mg/dL (ref 0.44–1.00)
GFR, Estimated: >60 mL/min (ref >60)
Glucose, Bld: 97 mg/dL (ref 70–99)
Potassium: 3.7 mmol/L (ref 3.5–5.1)
Sodium: 139 mmol/L (ref 135–145)
Total Bilirubin: 1.5 mg/dL — ABNORMAL HIGH (ref 0.3–1.2)
Total Protein: 7.8 g/dL (ref 6.5–8.1)

## 2021-10-15 ENCOUNTER — Encounter: Payer: Self-pay | Admitting: Hematology

## 2021-10-15 ENCOUNTER — Other Ambulatory Visit (HOSPITAL_COMMUNITY): Payer: Self-pay

## 2021-11-02 ENCOUNTER — Other Ambulatory Visit (HOSPITAL_COMMUNITY): Payer: Self-pay

## 2021-11-04 ENCOUNTER — Other Ambulatory Visit: Payer: Self-pay | Admitting: Hematology

## 2021-11-04 ENCOUNTER — Other Ambulatory Visit (HOSPITAL_COMMUNITY): Payer: Self-pay

## 2021-11-04 MED ORDER — CAPECITABINE 500 MG PO TABS
ORAL_TABLET | ORAL | 0 refills | Status: DC
  Filled 2021-11-04: qty 98, 21d supply, fill #0

## 2021-11-05 ENCOUNTER — Other Ambulatory Visit (HOSPITAL_COMMUNITY): Payer: Self-pay

## 2021-11-05 ENCOUNTER — Encounter: Payer: Self-pay | Admitting: Hematology

## 2021-11-11 ENCOUNTER — Other Ambulatory Visit (HOSPITAL_COMMUNITY): Payer: Self-pay

## 2021-11-11 ENCOUNTER — Other Ambulatory Visit: Payer: Self-pay

## 2021-11-11 ENCOUNTER — Inpatient Hospital Stay (HOSPITAL_BASED_OUTPATIENT_CLINIC_OR_DEPARTMENT_OTHER): Payer: Self-pay | Admitting: Hematology

## 2021-11-11 ENCOUNTER — Inpatient Hospital Stay: Payer: Self-pay | Attending: Nurse Practitioner

## 2021-11-11 VITALS — BP 131/70 | HR 80 | Temp 98.3°F | Resp 18 | Ht 64.0 in | Wt 158.1 lb

## 2021-11-11 DIAGNOSIS — I10 Essential (primary) hypertension: Secondary | ICD-10-CM | POA: Insufficient documentation

## 2021-11-11 DIAGNOSIS — C23 Malignant neoplasm of gallbladder: Secondary | ICD-10-CM

## 2021-11-11 LAB — CMP (CANCER CENTER ONLY)
ALT: 17 U/L (ref 0–44)
AST: 19 U/L (ref 15–41)
Albumin: 4.3 g/dL (ref 3.5–5.0)
Alkaline Phosphatase: 82 U/L (ref 38–126)
Anion gap: 7 (ref 5–15)
BUN: 17 mg/dL (ref 6–20)
CO2: 27 mmol/L (ref 22–32)
Calcium: 9.2 mg/dL (ref 8.9–10.3)
Chloride: 106 mmol/L (ref 98–111)
Creatinine: 0.76 mg/dL (ref 0.44–1.00)
GFR, Estimated: >60 mL/min (ref >60)
Glucose, Bld: 100 mg/dL — ABNORMAL HIGH (ref 70–99)
Potassium: 3.6 mmol/L (ref 3.5–5.1)
Sodium: 140 mmol/L (ref 135–145)
Total Bilirubin: 1.4 mg/dL — ABNORMAL HIGH (ref 0.3–1.2)
Total Protein: 7.5 g/dL (ref 6.5–8.1)

## 2021-11-11 LAB — CBC WITH DIFFERENTIAL (CANCER CENTER ONLY)
Abs Immature Granulocytes: 0.01 10*3/uL (ref 0.00–0.07)
Basophils Absolute: 0 10*3/uL (ref 0.0–0.1)
Basophils Relative: 0 %
Eosinophils Absolute: 0.2 10*3/uL (ref 0.0–0.5)
Eosinophils Relative: 3 %
HCT: 36.7 % (ref 36.0–46.0)
Hemoglobin: 12.6 g/dL (ref 12.0–15.0)
Immature Granulocytes: 0 %
Lymphocytes Relative: 39 %
Lymphs Abs: 2.2 10*3/uL (ref 0.7–4.0)
MCH: 32.6 pg (ref 26.0–34.0)
MCHC: 34.3 g/dL (ref 30.0–36.0)
MCV: 95.1 fL (ref 80.0–100.0)
Monocytes Absolute: 0.6 10*3/uL (ref 0.1–1.0)
Monocytes Relative: 11 %
Neutro Abs: 2.6 10*3/uL (ref 1.7–7.7)
Neutrophils Relative %: 47 %
Platelet Count: 349 10*3/uL (ref 150–400)
RBC: 3.86 MIL/uL — ABNORMAL LOW (ref 3.87–5.11)
RDW: 16.1 % — ABNORMAL HIGH (ref 11.5–15.5)
WBC Count: 5.6 10*3/uL (ref 4.0–10.5)
nRBC: 0 % (ref 0.0–0.2)

## 2021-11-11 MED ORDER — CAPECITABINE 500 MG PO TABS
ORAL_TABLET | ORAL | 0 refills | Status: DC
  Filled 2021-11-11: qty 98, fill #0
  Filled 2021-11-26 (×2): qty 98, 21d supply, fill #0

## 2021-11-11 NOTE — Progress Notes (Signed)
[place] [place]   [phone] [phone]   Clinic Follow up Note   Patient Care Team: Pcp, No as PCP - General  Date of Service:  11/11/2021  CHIEF COMPLAINT: f/u of gallbladder cancer  CURRENT THERAPY:  Adjuvant Xeloda 1500 mg AM/2000 mg PM days 1-14 q21 days, x6 months, starting 10/02/21.   ASSESSMENT & PLAN:  Michele Arnold is a 60 y.o. female with   1. 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  Work-up consistent with acute cholecystitis. On cholecystectomy by Dr. Hassell Done 07/23/21, she had incidental finding of poorly differentiated adenocarcinoma in the gallbladder neck, invading perimuscular soft tissue with a positive cystic duct margin.  -Staging work-up was negative for metastatic disease, baseline CA 19-9 was normal -She subsequently underwent oncologic staging surgery, resection of positive margin, and portal lymph node dissection Dr. Zenia Resides on 09/04/2021, there is no residual carcinoma.  Overall pT2a N0 M0  -Began adjuvant Xeloda 1500 mg a.m./2000 mg p.m. for 2 weeks on/1 week off, plan for 6 months, starting 10/02/21 -she is tolerating well with some mild skin toxicities. She does note continued intermittent abdominal pain from surgery. -Labs reviewed, CBC normal, CMP normal except T bili 1.4.  Abdominal exam is benign.  This is possibly from Xeloda given that it normalized after surgery and is slightly elevated again.  We will monitor closely -Plan to begin cycle 3 Xeloda tomorrow, 12/29.  Follow-up in 3 weeks.   2. Abdominal pain, transaminitis, hyperbilirubinemia -secondary to #1. On 07/21/21 AST 1006, ALT 530, alk phos 138, Tbili 3.4 -labs and pain improved after surgeries -mild incisional pain managed with tylenol PRN -T bili normalized after surgery, slightly elevated today to 1.4, likely related to  Xeloda   3. Hypertension -On lisinopril and HCTZ, prescribed by Eulogio Bear, DO   4. Health maintenance, disease prevention -States she was scheduled for cancer screenings in December but canceled due to her new cancer diagnosis.  She is overdue -I encouraged her to stay up-to-date on age-appropriate cancer screenings, vaccines, and live healthy active lifestyle   5. Social -She began a new job 1 week prior to her illness, she states she still has a job but not sure what her role will be when she returns -Lives with spouse, has 2 daughters. She has good family support -Followed by social work/financial for co-pay assistance with Xeloda    Plan: -begin cycle 3 Xeloda tomorrow -lab and f/u on 12/04/21 as scheduled   No problem-specific Assessment & Plan notes found for this encounter.   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 Cholelithiasis with distended gallbladder. No CT evidence of acute cholecystitis. Consider further evaluation with right upper quadrant ultrasound as clinically indicated. 2. Punctate nonobstructive left nephrolithiasis. 3. Aortic Atherosclerosis (ICD10-I70.0).   07/21/2021 Imaging   ABD [place] RUQ IMPRESSION: 1. Cholelithiasis without evidence of acute cholecystitis. 2. Mild hepatic steatosis.     07/21/2021 Imaging   MRA/MRCP IMPRESSION: 1. No biliary ductal dilation. No choledocholithiasis. 2. Mild hepatic steatosis. 3. Cholelithiasis with dilation of the gallbladder and trace pericholecystic fluid. No gallbladder wall thickening or abnormal wall enhancement. Findings which are equivocal for acute cholecystitis. Consider further evaluation with nuclear medicine HIDA scan to assess cystic duct patency if clinically indicated.   07/22/2021 Imaging   HIDA SCAN IMPRESSION: Scintigraphic findings most consistent with  acute cholecystitis.   07/23/2021 Surgery   Preop Dx:         Acute cholecystitis Postop Dx:      Acute cholecystitis with 1 cm stone impacted in the infundibulum Procedure:      Xi robotic cholecystectomy with ICG By Dr. Johnathan Hausen   07/23/2021 Pathology Results   FINAL MICROSCOPIC DIAGNOSIS:  A. GALLBLADDER, CHOLECYSTECTOMY:  - Invasive poorly differentiated adenocarcinoma, 4.3 cm, involving  gallbladder neck  - Carcinoma invades perimuscular soft tissue  - Cystic duct margin is focally positive for carcinoma  - Focally suspicious for lymphovascular invasion   Procedure: Cholecystectomy  Tumor Site: Gallbladder neck  Tumor Size: 4.3 cm  Histologic Type: Adenocarcinoma  Histologic Grade: G3: Poorly differentiated  Tumor Extension: Tumor invades perimuscular connective tissue on the peritoneal side without serosal involvement  Margins:       Margin Status for Invasive Carcinoma: Cystic duct margin is positive for carcinoma  Regional Lymph Nodes: Not applicable (no lymph nodes submitted or found)  Distant Metastasis:       Distant Site(s) Involved: Not applicable  Pathologic Stage Classification (pTNM, AJCC 8th Edition): pT2a, pN not assigned    08/20/2021 Imaging   CT chest IMPRESSION: Negative. No CT evidence for acute intrathoracic abnormality. Negative for pulmonary nodule or evidence for metastatic disease to the chest.   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 Michaelle Birks Staging laparoscopy Exploratory laparotomy with intraoperative cholangiogram Excision of new cystic duct margin Partial central hepatectomy (resection of gallbladder fossa - segments 4b and 5) Portal lymph node dissection   09/04/2021 Pathology Results   FINAL MICROSCOPIC DIAGNOSIS:  A. CYSTIC DUCT, NEW MARGIN, EXCISION:  -  No adenocarcinoma identified  B. LYMPH NODE, PORTAL, EXCISION:  -  Nodular fat necrosis and fibrosis  -   No nodal tissue identified  C. GALLBLADDER, FOSSA, EXCISION:  -  Benign liver with periductular chronic inflammation and  macrovesicular steatosis  -  No adenocarcinoma identified  D. LYMPH NODE, STATION EIGHT, EXCISION:  -  No adenocarcinoma identified in one lymph node (0/1)    09/04/2021 Cancer Staging   Staging form: Gallbladder, AJCC 8th Edition - Clinical stage from 09/04/2021: Stage IIA (cT2a, cN0, cM0) - Signed by Alla Feeling, NP on 09/21/2021 Stage prefix: Initial diagnosis Total positive nodes: 0 Histologic grade (G): G3 Histologic grading system: 3 grade system Histologic sub-type: Adenocarcinoma    09/28/2021 -  Chemotherapy   Patient is on Treatment Plan : BREAST Capecitabine q21d        INTERVAL HISTORY:  Shardea Cwynar is here for a follow up of gallbladder cancer. She was last seen by NP Lacie on 10/13/21. She presents to the clinic alone. She reports she had stomach pain for 4 days while she was on the Xeloda. She notes it went away on its own. She reports diarrhea that was likely related to a stomach bug, as other people in her family also experienced diarrhea. She also reports she had some ankle/foot swelling. She notes it was previously painful but is not now.   All other systems were reviewed with the patient and are negative.  MEDICAL HISTORY:  Past Medical History:  Diagnosis Date   Cancer [facility])    Hypertension     SURGICAL HISTORY: Past Surgical History:  Procedure Laterality Date   CHOLECYSTECTOMY  07/23/2021   INTRAOPERATIVE  CHOLANGIOGRAM N/A 09/04/2021   Procedure: INTRAOPERATIVE CHOLANGIOGRAM;  Surgeon: Dwan Bolt, MD;  Location: [facility];  Service: General;  Laterality: N/A;   LAPAROSCOPY N/A 09/04/2021   Procedure: STAGING LAPAROSCOPY;  Surgeon: Dwan Bolt, MD;  Location: [place];  Service: General;  Laterality: N/A;   LYMPH NODE DISSECTION N/A 09/04/2021   Procedure: PORTAL LYMPH NODE DISSECTION;  Surgeon: Dwan Bolt,  MD;  Location: [place];  Service: General;  Laterality: N/A;   OPEN HEPATECTOMY  N/A 09/04/2021   Procedure: OPEN PARTIAL CENTRAL HEPATECTOMY;  Surgeon: Dwan Bolt, MD;  Location: [place];  Service: General;  Laterality: N/A;    I have reviewed the social history and family history with the patient and they are unchanged from previous note.  ALLERGIES:  has No Known Allergies.  MEDICATIONS:  Current Outpatient Medications  Medication Sig Dispense Refill   acetaminophen (TYLENOL) 325 MG tablet Take 2 tablets (650 mg total) by mouth every 6 (six) hours as needed for mild pain or fever.     capecitabine (XELODA) 500 MG tablet Take 3 tabs in morning and 4 tabs in evening,every 12 hours,on day 1-14 every 21 days. 98 tablet 0   hydrochlorothiazide (MICROZIDE) 12.5 MG capsule Take 12.5 mg by mouth daily.     lisinopril (ZESTRIL) 10 MG tablet Take 1 tablet (10 mg total) by mouth daily. 30 tablet 0   prochlorperazine (COMPAZINE) 10 MG tablet Take 10 mg by mouth every 6 (six) hours as needed. (Patient not taking: Reported on 10/13/2021)     No current facility-administered medications for this visit.    PHYSICAL EXAMINATION: ECOG PERFORMANCE STATUS: 1 - Symptomatic but completely ambulatory  Vitals:   11/11/21 1428  BP: 131/70  Pulse: 80  Resp: 18  Temp: 98.3 F (36.8 C)  SpO2: 100%   Wt Readings from Last 3 Encounters:  11/11/21 158 lb 1.6 oz (71.7 kg)  10/13/21 158 lb 8 oz (71.9 kg)  09/21/21 154 lb 8 oz (70.1 kg)     GENERAL:alert, no distress and comfortable SKIN: skin color, texture, turgor are normal, no rashes or significant lesions EYES: normal, Conjunctiva are pink and non-injected, sclera clear  NECK: supple, thyroid normal size, non-tender, without nodularity LYMPH:  no palpable lymphadenopathy in the cervical, axillary  LUNGS: clear to auscultation and percussion with normal breathing effort HEART: regular rate & rhythm and no murmurs and no lower extremity  edema ABDOMEN:abdomen soft, non-tender and normal bowel sounds Musculoskeletal:no cyanosis of digits and no clubbing  NEURO: alert & oriented x 3 with fluent speech, no focal motor/sensory deficits  LABORATORY DATA:  I have reviewed the data as listed CBC Latest Ref Rng & Units 11/11/2021 10/13/2021 09/21/2021  WBC 4.0 - 10.5 K/uL 5.6 5.6 6.1  Hemoglobin 12.0 - 15.0 g/dL 12.6 13.0 12.9  Hematocrit 36.0 - 46.0 % 36.7 39.1 39.0  Platelets 150 - 400 K/uL 349 366 649(H)     CMP Latest Ref Rng & Units 11/11/2021 10/13/2021 09/21/2021  Glucose 70 - 99 mg/dL 100(H) 97 106(H)  BUN 6 - 20 mg/dL _0 Creatinine 0.44 - 1.00 mg/dL 0.76 0.75 0.75  Sodium 135 - 145 mmol/L 140 139 141  Potassium 3.5 - 5.1 mmol/L 3.6 3.7 4.0  Chloride 98 - 111 mmol/L 106 105 108  CO2 22 - 32 mmol/L _1 Calcium 8.9 - 10.3 mg/dL 9.2 9.4 9.7  Total Protein 6.5 - 8.1 g/dL 7.5 7.8 8.0  Total Bilirubin  0.3 - 1.2 mg/dL 1.4(H) 1.5(H) 0.7  Alkaline Phos 38 - 126 U/L 82 81 112  AST 15 - 41 U/L _0 ALT 0 - 44 U/L _1 RADIOGRAPHIC STUDIES: I have personally reviewed the radiological images as listed and agreed with the findings in the report. No results found.    No orders of the defined types were placed in this encounter.  All questions were answered. The patient knows to call the clinic with any problems, questions or concerns. No barriers to learning was detected. The total time spent in the appointment was 30 minutes.     Truitt Merle, MD 11/11/2021   I, Wilburn Mylar, am acting as scribe for Truitt Merle, MD.   I have reviewed the above documentation for accuracy and completeness, and I agree with the above.

## 2021-11-14 ENCOUNTER — Encounter: Payer: Self-pay | Admitting: Hematology

## 2021-11-20 ENCOUNTER — Other Ambulatory Visit: Payer: Self-pay

## 2021-11-20 ENCOUNTER — Inpatient Hospital Stay: Payer: 59 | Attending: Nurse Practitioner | Admitting: Physician Assistant

## 2021-11-20 VITALS — BP 131/67 | HR 64 | Temp 97.8°F | Resp 18 | Ht 64.0 in | Wt 156.8 lb

## 2021-11-20 DIAGNOSIS — C23 Malignant neoplasm of gallbladder: Secondary | ICD-10-CM | POA: Diagnosis not present

## 2021-11-20 DIAGNOSIS — C787 Secondary malignant neoplasm of liver and intrahepatic bile duct: Secondary | ICD-10-CM | POA: Insufficient documentation

## 2021-11-20 DIAGNOSIS — K8000 Calculus of gallbladder with acute cholecystitis without obstruction: Secondary | ICD-10-CM | POA: Insufficient documentation

## 2021-11-20 DIAGNOSIS — N200 Calculus of kidney: Secondary | ICD-10-CM | POA: Insufficient documentation

## 2021-11-20 DIAGNOSIS — K5900 Constipation, unspecified: Secondary | ICD-10-CM | POA: Insufficient documentation

## 2021-11-20 DIAGNOSIS — K760 Fatty (change of) liver, not elsewhere classified: Secondary | ICD-10-CM | POA: Insufficient documentation

## 2021-11-20 DIAGNOSIS — I10 Essential (primary) hypertension: Secondary | ICD-10-CM | POA: Insufficient documentation

## 2021-11-20 DIAGNOSIS — R109 Unspecified abdominal pain: Secondary | ICD-10-CM | POA: Diagnosis not present

## 2021-11-20 DIAGNOSIS — Z79899 Other long term (current) drug therapy: Secondary | ICD-10-CM | POA: Insufficient documentation

## 2021-11-20 DIAGNOSIS — N281 Cyst of kidney, acquired: Secondary | ICD-10-CM | POA: Insufficient documentation

## 2021-11-20 LAB — CMP (CANCER CENTER ONLY)
ALT: 24 U/L (ref 0–44)
AST: 28 U/L (ref 15–41)
Albumin: 4.6 g/dL (ref 3.5–5.0)
Alkaline Phosphatase: 80 U/L (ref 38–126)
Anion gap: 8 (ref 5–15)
BUN: 15 mg/dL (ref 6–20)
CO2: 25 mmol/L (ref 22–32)
Calcium: 9.7 mg/dL (ref 8.9–10.3)
Chloride: 105 mmol/L (ref 98–111)
Creatinine: 0.71 mg/dL (ref 0.44–1.00)
GFR, Estimated: >60 mL/min (ref >60)
Glucose, Bld: 102 mg/dL — ABNORMAL HIGH (ref 70–99)
Potassium: 3.8 mmol/L (ref 3.5–5.1)
Sodium: 138 mmol/L (ref 135–145)
Total Bilirubin: 2.4 mg/dL — ABNORMAL HIGH (ref 0.3–1.2)
Total Protein: 8.1 g/dL (ref 6.5–8.1)

## 2021-11-20 LAB — CBC WITH DIFFERENTIAL (CANCER CENTER ONLY)
Abs Immature Granulocytes: 0 10*3/uL (ref 0.00–0.07)
Basophils Absolute: 0 10*3/uL (ref 0.0–0.1)
Basophils Relative: 0 %
Eosinophils Absolute: 0.1 10*3/uL (ref 0.0–0.5)
Eosinophils Relative: 2 %
HCT: 39.7 % (ref 36.0–46.0)
Hemoglobin: 13.6 g/dL (ref 12.0–15.0)
Immature Granulocytes: 0 %
Lymphocytes Relative: 32 %
Lymphs Abs: 2.2 10*3/uL (ref 0.7–4.0)
MCH: 33.1 pg (ref 26.0–34.0)
MCHC: 34.3 g/dL (ref 30.0–36.0)
MCV: 96.6 fL (ref 80.0–100.0)
Monocytes Absolute: 0.5 10*3/uL (ref 0.1–1.0)
Monocytes Relative: 7 %
Neutro Abs: 4.1 10*3/uL (ref 1.7–7.7)
Neutrophils Relative %: 59 %
Platelet Count: 370 10*3/uL (ref 150–400)
RBC: 4.11 MIL/uL (ref 3.87–5.11)
RDW: 16.6 % — ABNORMAL HIGH (ref 11.5–15.5)
WBC Count: 7.1 10*3/uL (ref 4.0–10.5)
nRBC: 0 % (ref 0.0–0.2)

## 2021-11-20 LAB — LIPASE, BLOOD: Lipase: 34 U/L (ref 11–51)

## 2021-11-20 NOTE — Patient Instructions (Signed)
Ultrasound is Monday morning at 8 am at [facility]. Nothing to eat or drink after midnight.  Have your labs drawn at [facility] at 9:15  after the ultrasound. Location is second floor at the end of the hall.  Come to [facility] to see Dr. Burr Medico Monday at 12:40 to discuss your results.   Do not take Xeloda until you talk to Dr. Burr Medico.  _____________  Lilyan Punt es el lunes por la [place] a las 8 am en [facility]. Nada para comer o beber despus de la medianoche.  Hgase las pruebas de laboratorio en [place] a las 9:15 despus de la ecografa. La ubicacin es el segundo piso al final del pasillo.  [place] a ver al Dr. Darreld Mclean lunes a las 12:40 para hablar sobre sus [place].   No tome Xeloda hasta que hable con el Dr. Burr Medico.

## 2021-11-20 NOTE — Progress Notes (Signed)
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 Cholelithiasis with distended gallbladder. No CT evidence of acute cholecystitis. Consider further evaluation with right upper quadrant ultrasound as clinically indicated. 2. Punctate nonobstructive left nephrolithiasis. 3. Aortic Atherosclerosis (ICD10-I70.0).   07/21/2021 Imaging   ABD [place] RUQ IMPRESSION: 1. Cholelithiasis without evidence of acute cholecystitis. 2. Mild hepatic steatosis.     07/21/2021 Imaging   MRA/MRCP IMPRESSION: 1. No biliary ductal dilation. No choledocholithiasis. 2. Mild hepatic steatosis. 3. Cholelithiasis with dilation of the gallbladder and trace pericholecystic fluid. No gallbladder wall thickening or abnormal wall enhancement. Findings which are equivocal for acute cholecystitis. Consider further evaluation with nuclear medicine HIDA scan to assess cystic duct patency if clinically indicated.   07/22/2021 Imaging   HIDA SCAN IMPRESSION: Scintigraphic findings most consistent with acute cholecystitis.   07/23/2021 Surgery   Preop Dx:        Acute cholecystitis Postop Dx:      Acute cholecystitis with 1 cm stone impacted in the infundibulum Procedure:      Xi robotic cholecystectomy with ICG By Dr. Johnathan Hausen   07/23/2021 Pathology Results   FINAL MICROSCOPIC DIAGNOSIS:  A. GALLBLADDER, CHOLECYSTECTOMY:  - Invasive poorly differentiated adenocarcinoma, 4.3 cm, involving  gallbladder neck  - Carcinoma invades perimuscular soft tissue  - Cystic duct margin is focally positive for carcinoma  - Focally suspicious for lymphovascular invasion   Procedure: Cholecystectomy  Tumor Site: Gallbladder  neck  Tumor Size: 4.3 cm  Histologic Type: Adenocarcinoma  Histologic Grade: G3: Poorly differentiated  Tumor Extension: Tumor invades perimuscular connective tissue on the peritoneal side without serosal involvement  Margins:       Margin Status for Invasive Carcinoma: Cystic duct margin is positive for carcinoma  Regional Lymph Nodes: Not applicable (no lymph nodes submitted or found)  Distant Metastasis:       Distant Site(s) Involved: Not applicable  Pathologic Stage Classification (pTNM, AJCC 8th Edition): pT2a, pN not assigned    08/20/2021 Imaging   CT chest IMPRESSION: Negative. No CT evidence for acute intrathoracic abnormality. Negative for pulmonary nodule or evidence for metastatic disease to the chest.   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 Michaelle Birks Staging laparoscopy Exploratory laparotomy with intraoperative cholangiogram Excision of new cystic duct margin Partial central hepatectomy (resection of gallbladder fossa - segments 4b and 5) Portal lymph node dissection   09/04/2021 Pathology Results   FINAL MICROSCOPIC DIAGNOSIS:  A. CYSTIC DUCT, NEW MARGIN, EXCISION:  -  No adenocarcinoma identified  B. LYMPH NODE, PORTAL, EXCISION:  -  Nodular fat necrosis and fibrosis  -  No nodal tissue identified  C. GALLBLADDER, FOSSA, EXCISION:  -  Benign liver with periductular chronic inflammation and  macrovesicular steatosis  -  No adenocarcinoma identified  D. LYMPH NODE, STATION EIGHT, EXCISION:  -  No adenocarcinoma identified in one lymph node (0/1)    09/04/2021 Cancer Staging   Staging form: Gallbladder, AJCC 8th Edition - Clinical stage from 09/04/2021: Stage IIA (cT2a, cN0, cM0) -  Signed by Alla Feeling, NP on 09/21/2021 Stage prefix: Initial diagnosis Total positive nodes: 0 Histologic grade (G): G3 Histologic grading  system: 3 grade system Histologic sub-type: Adenocarcinoma    09/28/2021 -  Chemotherapy   Patient is on Treatment Plan : BREAST Capecitabine q21d       Current Therapy: Xeloda  Interval history-  Michele Arnold is a 61 yo female with adenocarcinoma of gallbladder neck s/p cholecystectomy in 09/22 presenting to [facility] today with chief complaint of RUQ abdominal pain x 1 week. Pain is intermittent. She describes the pain as a bloating feeling. She rates pain 8/10 in severity. Pain radiates to epigastric area sometimes, does not radiate to back or pelvis. She first noticed the pain after restarting her Xeloda after a break. She has not had abdominal pain while taking Xeloda in the past. She states the pain is sometimes worse with movement and food, unable to tell really. She has been taking tylenol for pain with some relief. She also reports lifting 30 pounds of coins on 1/4 and is unsure if that has made her pain worse. She cannot remember if this feels like her previous pain prior to her cholecystectomy. Her last bowel movement was last night and was normal. She had constipation and diarrhea immediately after her cholecystectomy although has had normal bowel regimen for the last several weeks she reports. She denies fever, chills, weight loss, chest pain, shortness of breath, chest pain, urinary symptoms, early satiety, diarrhea, blood in stool, rash.    Due to language barrier, an interpreter was present during the history-taking and subsequent discussion (and for part of the physical exam) with this patient.   ROS  All other systems are reviewed and are negative for acute change except as noted in the HPI.    No Known Allergies   Past Medical History:  Diagnosis Date   Cancer ([facility])    Hypertension      Past Surgical History:  Procedure Laterality Date   CHOLECYSTECTOMY  07/23/2021   INTRAOPERATIVE CHOLANGIOGRAM N/A 09/04/2021   Procedure: INTRAOPERATIVE CHOLANGIOGRAM;   Surgeon: Dwan Bolt, MD;  Location: [facility] OR;  Service: General;  Laterality: N/A;   LAPAROSCOPY N/A 09/04/2021   Procedure: STAGING LAPAROSCOPY;  Surgeon: Dwan Bolt, MD;  Location: [place];  Service: General;  Laterality: N/A;   LYMPH NODE DISSECTION N/A 09/04/2021   Procedure: PORTAL LYMPH NODE DISSECTION;  Surgeon: Dwan Bolt, MD;  Location: [place];  Service: General;  Laterality: N/A;   OPEN HEPATECTOMY  N/A 09/04/2021   Procedure: OPEN PARTIAL CENTRAL HEPATECTOMY;  Surgeon: Dwan Bolt, MD;  Location: [place];  Service: General;  Laterality: N/A;    Social History   Socioeconomic History   Marital status: Married    Spouse name: Not on file   Number of children: Not on file   Years of education: Not on file   Highest education level: Not on file  Occupational History   Not on file  Tobacco Use   Smoking status: Never   Smokeless tobacco: Never  Vaping Use   Vaping Use: Never used  Substance and Sexual Activity   Alcohol use: Not Currently    Comment: 2-3 times per year, special occasion   Drug use: Never   Sexual activity: Not on file  Other Topics Concern   Not on file  Social History Narrative   Not on file   Social Determinants of Health   Financial Resource Strain: Not on file  Food Insecurity: Not on file  Transportation Needs: Not on file  Physical Activity: Not on file  Stress: Not on file  Social Connections: Not on file  Intimate Partner Violence: Not on file    Family History  Problem Relation Age of Onset   Cancer Maternal Aunt        unknown type     Current Outpatient Medications:    acetaminophen (TYLENOL) 325 MG tablet, Take 2 tablets (650 mg total) by mouth every 6 (six) hours as needed for mild pain or fever., Disp: , Rfl:    capecitabine (XELODA) 500 MG tablet, Take 3 tabs in morning and 4 tabs in evening,every 12 hours,on day 1-14 every 21 days., Disp: 98 tablet, Rfl: 0   hydrochlorothiazide (MICROZIDE) 12.5 MG capsule, Take  12.5 mg by mouth daily., Disp: , Rfl:    lisinopril (ZESTRIL) 10 MG tablet, Take 1 tablet (10 mg total) by mouth daily., Disp: 30 tablet, Rfl: 0   prochlorperazine (COMPAZINE) 10 MG tablet, Take 10 mg by mouth every 6 (six) hours as needed. (Patient not taking: Reported on 10/13/2021), Disp: , Rfl:   PHYSICAL EXAM: ECOG FS:1 - Symptomatic but completely ambulatory    Vitals:   11/20/21 1338  BP: 131/67  Pulse: 64  Resp: 18  Temp: 97.8 F (36.6 C)  TempSrc: Oral  SpO2: 99%  Weight: 156 lb 12.8 oz (71.1 kg)  Height: 5\' 4"  (1.626 m)   Physical Exam Vitals and nursing note reviewed.  Constitutional:      Appearance: She is well-developed. She is not ill-appearing or toxic-appearing.  HENT:     Head: Normocephalic and atraumatic.     Right Ear: External ear normal.     Left Ear: External ear normal.     Nose: Nose normal.  Eyes:     General: No scleral icterus.       Right eye: No discharge.        Left eye: No discharge.     Conjunctiva/sclera: Conjunctivae normal.  Neck:     Vascular: No JVD.  Cardiovascular:     Rate and Rhythm: Normal rate and regular rhythm.     Pulses: Normal pulses.     Heart sounds: Normal heart sounds.  Pulmonary:     Effort: Pulmonary effort is normal.     Breath sounds: Normal breath sounds.  Abdominal:     General: Bowel sounds are normal. There is no distension.     Palpations: Abdomen is soft. There is no mass.     Tenderness: There is no right CVA tenderness, left CVA tenderness, guarding or rebound.     Hernia: No hernia is present.     Comments: Tender to palpation of RUQ. No peritoneal signs. Surgical scar well healed without signs of infection.  Musculoskeletal:        General: Normal range of motion.     Cervical back: Normal range of motion.  Skin:    General: Skin is warm and dry.  Neurological:     Mental Status: She is oriented to person, place, and time.     GCS: GCS eye subscore is 4. GCS verbal subscore is 5. GCS motor  subscore is 6.     Comments: Fluent speech, no facial droop.  Psychiatric:        Behavior: Behavior normal.       LABORATORY DATA: I have reviewed the data as listed CBC Latest Ref Rng & Units 11/20/2021 11/11/2021 10/13/2021  WBC 4.0 -  10.5 K/uL 7.1 5.6 5.6  Hemoglobin 12.0 - 15.0 g/dL 13.6 12.6 13.0  Hematocrit 36.0 - 46.0 % 39.7 36.7 39.1  Platelets 150 - 400 K/uL 370 349 366     CMP Latest Ref Rng & Units 11/20/2021 11/11/2021 10/13/2021  Glucose 70 - 99 mg/dL 102(H) 100(H) 97  BUN 6 - 20 mg/dL 15 17 16   Creatinine 0.44 - 1.00 mg/dL 0.71 0.76 0.75  Sodium 135 - 145 mmol/L 138 140 139  Potassium 3.5 - 5.1 mmol/L 3.8 3.6 3.7  Chloride 98 - 111 mmol/L 105 106 105  CO2 22 - 32 mmol/L 25 27 26   Calcium 8.9 - 10.3 mg/dL 9.7 9.2 9.4  Total Protein 6.5 - 8.1 g/dL 8.1 7.5 7.8  Total Bilirubin 0.3 - 1.2 mg/dL 2.4(H) 1.4(H) 1.5(H)  Alkaline Phos 38 - 126 U/L 80 82 81  AST 15 - 41 U/L 28 19 23   ALT 0 - 44 U/L 24 17 22        RADIOGRAPHIC STUDIES: I have personally reviewed the radiological images as listed and agreed with the findings in the report. No images are attached to the encounter. No results found.   ASSESSMENT & PLAN: Patient is a 61 y.o. female with history of adenocarcinoma of gallbladder s/p cholecystectomy currently on Xeloda followed by oncologist Dr. Burr Medico.  #)RUQ pain- Patient nontoxic appearing. VSS. RUQ pain reproducible on exam. No peritoneal signs. No infectious symptoms. Labs today show unremarkable CBC and CMP with elevated T. Bili at 2.4 which is increased compared to prior x 9 days ago when it was 1.4. No transaminitis, renal insufficieny or severe electrolyte derangement. Lipase WNL.  Discussed with oncologist Dr. Burr Medico who recommends US abdomen to evaluate liver and CBD and repeat labs. As it is Friday afternoon those were scheduled for Monday morning. Patient aware of appointments. She also has follow up appointment scheduled with Dr. Burr Medico Monday afternoon  to discuss results. Patient knows to hold Xeloda until next appointments. Discussed strict ED precautions should anything worsen.  #)Adenocarcinoma of GB- Patient will hold Xeloda as per above.   Visit Diagnosis: 1. Abdominal pain, unspecified abdominal location   2. 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    Answer:   [facility]   Lipase, blood    All questions were answered. The patient knows to call the clinic with any problems, questions or concerns. No barriers to learning was detected.  I have spent a total of 30 minutes minutes of face-to-face and non-face-to-face time, preparing to see the patient, obtaining and/or reviewing separately obtained history, performing a medically appropriate examination, counseling and educating the patient, ordering tests,  documenting clinical information in the electronic health record, and care coordination.     Thank you for allowing me to participate in the care of this patient.    Barrie Folk, PA-C Department of Hematology/Oncology [facility] at [facility] Phone: [phone]  [phone]    11/20/2021 4:46 PM

## 2021-11-23 ENCOUNTER — Inpatient Hospital Stay: Payer: Self-pay

## 2021-11-23 ENCOUNTER — Encounter: Payer: Self-pay | Admitting: Hematology

## 2021-11-23 ENCOUNTER — Ambulatory Visit (HOSPITAL_BASED_OUTPATIENT_CLINIC_OR_DEPARTMENT_OTHER)
Admission: RE | Admit: 2021-11-23 | Discharge: 2021-11-23 | Disposition: A | Discharge status: Home / Self Care | Payer: Self-pay | Source: Ambulatory Visit | Attending: Physician Assistant | Admitting: Physician Assistant

## 2021-11-23 ENCOUNTER — Inpatient Hospital Stay (HOSPITAL_BASED_OUTPATIENT_CLINIC_OR_DEPARTMENT_OTHER): Payer: 59 | Admitting: Hematology

## 2021-11-23 ENCOUNTER — Other Ambulatory Visit: Payer: Self-pay

## 2021-11-23 VITALS — BP 131/59 | HR 79 | Temp 98.6°F | Resp 18 | Ht 64.0 in | Wt 158.9 lb

## 2021-11-23 DIAGNOSIS — C23 Malignant neoplasm of gallbladder: Secondary | ICD-10-CM

## 2021-11-23 DIAGNOSIS — R109 Unspecified abdominal pain: Secondary | ICD-10-CM | POA: Insufficient documentation

## 2021-11-23 LAB — CMP (CANCER CENTER ONLY)
ALT: 23 U/L (ref 0–44)
AST: 24 U/L (ref 15–41)
Albumin: 4.5 g/dL (ref 3.5–5.0)
Alkaline Phosphatase: 69 U/L (ref 38–126)
Anion gap: 7 (ref 5–15)
BUN: 20 mg/dL (ref 6–20)
CO2: 26 mmol/L (ref 22–32)
Calcium: 9.6 mg/dL (ref 8.9–10.3)
Chloride: 106 mmol/L (ref 98–111)
Creatinine: 0.73 mg/dL (ref 0.44–1.00)
GFR, Estimated: >60 mL/min (ref >60)
Glucose, Bld: 101 mg/dL — ABNORMAL HIGH (ref 70–99)
Potassium: 3.8 mmol/L (ref 3.5–5.1)
Sodium: 139 mmol/L (ref 135–145)
Total Bilirubin: 1.8 mg/dL — ABNORMAL HIGH (ref 0.3–1.2)
Total Protein: 7.8 g/dL (ref 6.5–8.1)

## 2021-11-23 LAB — CBC WITH DIFFERENTIAL (CANCER CENTER ONLY)
Abs Immature Granulocytes: 0.01 10*3/uL (ref 0.00–0.07)
Basophils Absolute: 0 10*3/uL (ref 0.0–0.1)
Basophils Relative: 1 %
Eosinophils Absolute: 0.2 10*3/uL (ref 0.0–0.5)
Eosinophils Relative: 3 %
HCT: 38.2 % (ref 36.0–46.0)
Hemoglobin: 13 g/dL (ref 12.0–15.0)
Immature Granulocytes: 0 %
Lymphocytes Relative: 33 %
Lymphs Abs: 2.2 10*3/uL (ref 0.7–4.0)
MCH: 33.3 pg (ref 26.0–34.0)
MCHC: 34 g/dL (ref 30.0–36.0)
MCV: 97.9 fL (ref 80.0–100.0)
Monocytes Absolute: 0.6 10*3/uL (ref 0.1–1.0)
Monocytes Relative: 9 %
Neutro Abs: 3.6 10*3/uL (ref 1.7–7.7)
Neutrophils Relative %: 54 %
Platelet Count: 373 10*3/uL (ref 150–400)
RBC: 3.9 MIL/uL (ref 3.87–5.11)
RDW: 17.2 % — ABNORMAL HIGH (ref 11.5–15.5)
WBC Count: 6.6 10*3/uL (ref 4.0–10.5)
nRBC: 0 % (ref 0.0–0.2)

## 2021-11-23 MED ORDER — TRAMADOL HCL 50 MG PO TABS: 50.0000 mg | ORAL_TABLET | Freq: Four times a day (QID) | ORAL | 0 refills | Status: DC | PRN

## 2021-11-23 MED ORDER — PANTOPRAZOLE SODIUM 40 MG PO TBEC: 40.0000 mg | DELAYED_RELEASE_TABLET | Freq: Every day | ORAL | 0 refills | Status: DC

## 2021-11-23 NOTE — Progress Notes (Signed)
[employer]   [phone] [phone]   Clinic Follow up Note   Patient Care Team: Pcp, No as PCP - General Dwan Bolt, MD as Consulting Physician (General Surgery) Truitt Merle, MD as Consulting Physician (Hematology)  Date of Service:  11/23/2021  CHIEF COMPLAINT: f/u of gallbladder cancer  CURRENT THERAPY:  Adjuvant Xeloda 1500 mg AM/2000 mg PM days 1-14 q21 days, x6 months, starting 10/02/21.   ASSESSMENT & PLAN:  Michele Arnold is a 61 y.o. female with   1. Abdominal pain, hyperbilirubinemia -her T bili has increased since starting Xeloda, and she has had increased abdominal pain in the last few weeks.  -abdomen US earlier today (11/23/21) was good. T bili today is slightly improved but still elevated at 1.8. -she does report some constipation. I advised her to increase her miralax intake. -I recommend MRI/MRCP to further evaluate her abdominal pain. -I called in protonix and tramadol for her to try today   2. 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  Work-up consistent with acute cholecystitis. On cholecystectomy by Dr. Hassell Done 07/23/21, she had incidental finding of poorly differentiated adenocarcinoma in the gallbladder neck, invading perimuscular soft tissue with a positive cystic duct margin.  -Staging work-up was negative for metastatic disease, baseline CA 19-9 was normal -She subsequently underwent oncologic staging surgery, resection of positive margin, and portal lymph node dissection Dr. Zenia Resides on 09/04/21, there is no residual carcinoma.  Overall pT2a N0 M0  -Began adjuvant Xeloda 1500 mg a.m./2000 mg p.m. for 2 weeks on/1 week off, plan for 6 months, starting 10/02/21 -she is tolerating well with some mild skin toxicities. She does note continued intermittent abdominal pain from surgery. -Labs  reviewed, CBC normal, CMP normal except T bili 1.8.  Abdominal exam is benign.  This is possibly from Xeloda given that it normalized after surgery and is slightly elevated again.  We will hold her Xeloda for now to see if anything improves.   3. Hypertension -On lisinopril and HCTZ, prescribed by Eulogio Bear, DO   4. Health maintenance, disease prevention -States she was scheduled for cancer screenings in December but canceled due to her new cancer diagnosis.  She is overdue -I encouraged her to stay up-to-date on age-appropriate cancer screenings, vaccines, and live healthy active lifestyle   5. Social -She began a new job 1 week prior to her illness, she states she still has a job but not sure what her role will be when she returns -Lives with spouse, has 2 daughters. She has good family support -Followed by social work/financial for co-pay assistance with Xeloda     Plan: -continue to hold Xeloda -I called in Protonix and tramadol -lab and f/u in 2 weeks with abdomen MRI several days before   No problem-specific Assessment & Plan notes found for this encounter.   SUMMARY OF ONCOLOGIC HISTORY: Oncology History  Adenocarcinoma of gallbladder ([place])  07/21/2021 Imaging   CT AP IMPRESSION: 1. Cholelithiasis with distended gallbladder. No CT evidence of acute cholecystitis. Consider further evaluation with right upper quadrant ultrasound as clinically indicated. 2. Punctate nonobstructive left nephrolithiasis. 3. Aortic Atherosclerosis (ICD10-I70.0).   07/21/2021 Imaging   ABD [place] RUQ IMPRESSION: 1. Cholelithiasis without evidence of acute cholecystitis. 2. Mild hepatic steatosis.     07/21/2021 Imaging   MRA/MRCP IMPRESSION: 1. No biliary ductal dilation. No choledocholithiasis. 2. Mild hepatic steatosis. 3. Cholelithiasis with dilation  of the gallbladder and trace pericholecystic fluid. No gallbladder wall thickening or abnormal wall enhancement. Findings which are equivocal for  acute cholecystitis. Consider further evaluation with nuclear medicine HIDA scan to assess cystic duct patency if clinically indicated.   07/22/2021 Imaging   HIDA SCAN IMPRESSION: Scintigraphic findings most consistent with acute cholecystitis.   07/23/2021 Surgery   Preop Dx:        Acute cholecystitis Postop Dx:      Acute cholecystitis with 1 cm stone impacted in the infundibulum Procedure:      Xi robotic cholecystectomy with ICG By Dr. Johnathan Hausen   07/23/2021 Pathology Results   FINAL MICROSCOPIC DIAGNOSIS:  A. GALLBLADDER, CHOLECYSTECTOMY:  - Invasive poorly differentiated adenocarcinoma, 4.3 cm, involving  gallbladder neck  - Carcinoma invades perimuscular soft tissue  - Cystic duct margin is focally positive for carcinoma  - Focally suspicious for lymphovascular invasion   Procedure: Cholecystectomy  Tumor Site: Gallbladder neck  Tumor Size: 4.3 cm  Histologic Type: Adenocarcinoma  Histologic Grade: G3: Poorly differentiated  Tumor Extension: Tumor invades perimuscular connective tissue on the peritoneal side without serosal involvement  Margins:       Margin Status for Invasive Carcinoma: Cystic duct margin is positive for carcinoma  Regional Lymph Nodes: Not applicable (no lymph nodes submitted or found)  Distant Metastasis:       Distant Site(s) Involved: Not applicable  Pathologic Stage Classification (pTNM, AJCC 8th Edition): pT2a, pN not assigned    08/20/2021 Imaging   CT chest IMPRESSION: Negative. No CT evidence for acute intrathoracic abnormality. Negative for pulmonary nodule or evidence for metastatic disease to the chest.   09/01/2021 Tumor Marker   CA 19-9: 8 (normal)   09/04/2021 Initial Diagnosis   Adenocarcinoma of gallbladder ([place])   09/04/2021 Definitive Surgery   Procedure: by Dr. Michaelle Birks Staging laparoscopy Exploratory laparotomy with intraoperative cholangiogram Excision of new cystic duct margin Partial central hepatectomy (resection  of gallbladder fossa - segments 4b and 5) Portal lymph node dissection   09/04/2021 Pathology Results   FINAL MICROSCOPIC DIAGNOSIS:  A. CYSTIC DUCT, NEW MARGIN, EXCISION:  -  No adenocarcinoma identified  B. LYMPH NODE, PORTAL, EXCISION:  -  Nodular fat necrosis and fibrosis  -  No nodal tissue identified  C. GALLBLADDER, FOSSA, EXCISION:  -  Benign liver with periductular chronic inflammation and  macrovesicular steatosis  -  No adenocarcinoma identified  D. LYMPH NODE, STATION EIGHT, EXCISION:  -  No adenocarcinoma identified in one lymph node (0/1)    09/04/2021 Cancer Staging   Staging form: Gallbladder, AJCC 8th Edition - Clinical stage from 09/04/2021: Stage IIA (cT2a, cN0, cM0) - Signed by Alla Feeling, NP on 09/21/2021 Stage prefix: Initial diagnosis Total positive nodes: 0 Histologic grade (G): G3 Histologic grading system: 3 grade system Histologic sub-type: Adenocarcinoma    09/28/2021 -  Chemotherapy   Patient is on Treatment Plan : BREAST Capecitabine q21d        INTERVAL HISTORY:  Michele Arnold is here for a follow up of gallbladder cancer. She was last seen by me on 11/11/21. She presents to the clinic accompanied by our interpreter Almyra Free. She reports the pain is still present. She notes the pain goes away when she lays down, is sometimes present when sitting, and is mostly present when standing and bending over. She rates it at a 6/10 right now, which she at least partially attributes to it being pushed for the ultrasound this morning. She reports the pain  seems worse than the pain she had prior to surgery. She also reports constipation "from time to time."   All other systems were reviewed with the patient and are negative.  MEDICAL HISTORY:  Past Medical History:  Diagnosis Date   Cancer [facility])    Hypertension     SURGICAL HISTORY: Past Surgical History:  Procedure Laterality Date   CHOLECYSTECTOMY  07/23/2021   INTRAOPERATIVE  CHOLANGIOGRAM N/A 09/04/2021   Procedure: INTRAOPERATIVE CHOLANGIOGRAM;  Surgeon: Dwan Bolt, MD;  Location: [place];  Service: General;  Laterality: N/A;   LAPAROSCOPY N/A 09/04/2021   Procedure: STAGING LAPAROSCOPY;  Surgeon: Dwan Bolt, MD;  Location: [place];  Service: General;  Laterality: N/A;   LYMPH NODE DISSECTION N/A 09/04/2021   Procedure: PORTAL LYMPH NODE DISSECTION;  Surgeon: Dwan Bolt, MD;  Location: [place];  Service: General;  Laterality: N/A;   OPEN HEPATECTOMY  N/A 09/04/2021   Procedure: OPEN PARTIAL CENTRAL HEPATECTOMY;  Surgeon: Dwan Bolt, MD;  Location: [place];  Service: General;  Laterality: N/A;    I have reviewed the social history and family history with the patient and they are unchanged from previous note.  ALLERGIES:  has No Known Allergies.  MEDICATIONS:  Current Outpatient Medications  Medication Sig Dispense Refill   pantoprazole (PROTONIX) 40 MG tablet Take 1 tablet (40 mg total) by mouth daily. 30 tablet 0   traMADol (ULTRAM) 50 MG tablet Take 1 tablet (50 mg total) by mouth every 6 (six) hours as needed. 30 tablet 0   acetaminophen (TYLENOL) 325 MG tablet Take 2 tablets (650 mg total) by mouth every 6 (six) hours as needed for mild pain or fever.     capecitabine (XELODA) 500 MG tablet Take 3 tabs in morning and 4 tabs in evening,every 12 hours,on day 1-14 every 21 days. 98 tablet 0   hydrochlorothiazide (MICROZIDE) 12.5 MG capsule Take 12.5 mg by mouth daily.     lisinopril (ZESTRIL) 10 MG tablet Take 1 tablet (10 mg total) by mouth daily. 30 tablet 0   prochlorperazine (COMPAZINE) 10 MG tablet Take 10 mg by mouth every 6 (six) hours as needed. (Patient not taking: Reported on 10/13/2021)     No current facility-administered medications for this visit.    PHYSICAL EXAMINATION: ECOG PERFORMANCE STATUS: 1 - Symptomatic but completely ambulatory  Vitals:   11/23/21 1255  BP: (!) 131/59  Pulse: 79  Resp: 18  Temp: 98.6 F (37 C)   SpO2: 98%   Wt Readings from Last 3 Encounters:  11/23/21 158 lb 14.4 oz (72.1 kg)  11/20/21 156 lb 12.8 oz (71.1 kg)  11/11/21 158 lb 1.6 oz (71.7 kg)     GENERAL:alert, no distress and comfortable SKIN: skin color, texture, turgor are normal, no rashes or significant lesions EYES: normal, Conjunctiva are pink and non-injected, sclera clear  NECK: supple, thyroid normal size, non-tender, without nodularity LYMPH:  no palpable lymphadenopathy in the cervical, axillary  LUNGS: clear to auscultation and percussion with normal breathing effort HEART: regular rate & rhythm and no murmurs and no lower extremity edema ABDOMEN:abdomen soft, non-tender and normal bowel sounds Musculoskeletal:no cyanosis of digits and no clubbing  NEURO: alert & oriented x 3 with fluent speech, no focal motor/sensory deficits  LABORATORY DATA:  I have reviewed the data as listed CBC Latest Ref Rng & Units 11/23/2021 11/20/2021 11/11/2021  WBC 4.0 - 10.5 K/uL 6.6 7.1 5.6  Hemoglobin 12.0 - 15.0 g/dL 13.0 13.6 12.6  Hematocrit  36.0 - 46.0 % 38.2 39.7 36.7  Platelets 150 - 400 K/uL 373 370 349     CMP Latest Ref Rng & Units 11/23/2021 11/20/2021 11/11/2021  Glucose 70 - 99 mg/dL 101(H) 102(H) 100(H)  BUN 6 - 20 mg/dL 20 15 17   Creatinine 0.44 - 1.00 mg/dL 0.73 0.71 0.76  Sodium 135 - 145 mmol/L 139 138 140  Potassium 3.5 - 5.1 mmol/L 3.8 3.8 3.6  Chloride 98 - 111 mmol/L 106 105 106  CO2 22 - 32 mmol/L 26 25 27   Calcium 8.9 - 10.3 mg/dL 9.6 9.7 9.2  Total Protein 6.5 - 8.1 g/dL 7.8 8.1 7.5  Total Bilirubin 0.3 - 1.2 mg/dL 1.8(H) 2.4(H) 1.4(H)  Alkaline Phos 38 - 126 U/L 69 80 82  AST 15 - 41 U/L 24 28 19   ALT 0 - 44 U/L 23 24 17       RADIOGRAPHIC STUDIES: I have personally reviewed the radiological images as listed and agreed with the findings in the report. US Abdomen Complete  Result Date: 11/23/2021 CLINICAL DATA:  Right upper quadrant abdominal pain. EXAM: ABDOMEN ULTRASOUND COMPLETE COMPARISON:   July 21, 2021. FINDINGS: Gallbladder: Status post cholecystectomy. Common bile duct: Diameter: 4 mm which is within normal limits. Liver: No focal lesion identified. Within normal limits in parenchymal echogenicity. Portal vein is patent on color Doppler imaging with normal direction of blood flow towards the liver. IVC: No abnormality visualized. Pancreas: Not visualized due to overlying bowel gas. Spleen: Size and appearance within normal limits. Right Kidney: Length: 10.7 cm. 2.3 cm simple cyst is noted. Echogenicity within normal limits. No mass or hydronephrosis visualized. Left Kidney: Length: 10.5 cm. Echogenicity within normal limits. No mass or hydronephrosis visualized. Abdominal aorta: No aneurysm visualized. Other findings: None. IMPRESSION: Status post cholecystectomy. Simple right renal cyst. Pancreas not visualized due to overlying bowel gas. No other abnormality seen in the abdomen. Electronically Signed   By: Marijo Conception M.D.   On: 11/23/2021 09:07      Orders Placed This Encounter  Procedures   MR ABDOMEN WITH MRCP W CONTRAST    Standing Status:   Future    Standing Expiration Date:   11/23/2022    Order Specific Question:   Reason for Exam (SYMPTOM  OR DIAGNOSIS REQUIRED)    Answer:   RUQ abdominal pain and hyperbilirubinemia    Order Specific Question:   If indicated for the ordered procedure, I authorize the administration of contrast media per Radiology protocol    Answer:   Yes    Order Specific Question:   What is the patient's sedation requirement?    Answer:   No Sedation    Order Specific Question:   Does the patient have a pacemaker or implanted devices?    Answer:   No    Order Specific Question:   Preferred imaging location?    Answer:   [facility] (table limit - 550 lbs)   All questions were answered. The patient knows to call the clinic with any problems, questions or concerns. No barriers to learning was detected. The total time spent in the  appointment was 30 minutes.     Truitt Merle, MD 11/23/2021   I, Wilburn Mylar, am acting as scribe for Truitt Merle, MD.   I have reviewed the above documentation for accuracy and completeness, and I agree with the above.

## 2021-11-24 ENCOUNTER — Other Ambulatory Visit: Payer: Self-pay

## 2021-11-24 NOTE — Progress Notes (Signed)
Pt is scheduled for MRI at [facility] on 11/28/2021 @1430  and NPO 4hrs prior to appt.  Spoke with pt via telephone to confirm appt.  Pt confirmed appt on Saturday 11/28/2021.

## 2021-11-26 ENCOUNTER — Encounter: Payer: Self-pay | Admitting: Hematology

## 2021-11-26 ENCOUNTER — Other Ambulatory Visit (HOSPITAL_COMMUNITY): Payer: Self-pay

## 2021-11-28 ENCOUNTER — Ambulatory Visit (HOSPITAL_COMMUNITY)
Admission: RE | Admit: 2021-11-28 | Discharge: 2021-11-28 | Disposition: A | Discharge status: Home / Self Care | Payer: Self-pay | Source: Ambulatory Visit | Attending: Hematology | Admitting: Hematology

## 2021-11-28 ENCOUNTER — Other Ambulatory Visit: Payer: Self-pay | Admitting: Hematology

## 2021-11-28 DIAGNOSIS — C23 Malignant neoplasm of gallbladder: Secondary | ICD-10-CM

## 2021-11-28 MED ORDER — GADOBUTROL 1 MMOL/ML IV SOLN
7.0000 mL | Freq: Once | INTRAVENOUS | Status: AC | PRN
  Administered 2021-11-28: 7 mL via INTRAVENOUS

## 2021-11-30 ENCOUNTER — Other Ambulatory Visit (HOSPITAL_COMMUNITY): Payer: Self-pay

## 2021-12-01 ENCOUNTER — Other Ambulatory Visit (HOSPITAL_COMMUNITY): Payer: Self-pay

## 2021-12-01 ENCOUNTER — Encounter: Payer: Self-pay | Admitting: Hematology

## 2021-12-04 ENCOUNTER — Other Ambulatory Visit: Payer: Self-pay

## 2021-12-04 ENCOUNTER — Ambulatory Visit: Payer: Self-pay | Admitting: Hematology

## 2021-12-08 ENCOUNTER — Inpatient Hospital Stay: Payer: Self-pay

## 2021-12-08 ENCOUNTER — Encounter: Payer: Self-pay | Admitting: Hematology

## 2021-12-08 ENCOUNTER — Inpatient Hospital Stay (HOSPITAL_BASED_OUTPATIENT_CLINIC_OR_DEPARTMENT_OTHER): Payer: 59 | Admitting: Hematology

## 2021-12-08 ENCOUNTER — Other Ambulatory Visit: Payer: Self-pay

## 2021-12-08 VITALS — BP 148/70 | HR 77 | Temp 98.4°F | Resp 18 | Ht 64.0 in | Wt 156.7 lb

## 2021-12-08 DIAGNOSIS — C23 Malignant neoplasm of gallbladder: Secondary | ICD-10-CM

## 2021-12-08 LAB — CBC WITH DIFFERENTIAL (CANCER CENTER ONLY)
Abs Immature Granulocytes: 0 10*3/uL (ref 0.00–0.07)
Basophils Absolute: 0 10*3/uL (ref 0.0–0.1)
Basophils Relative: 1 %
Eosinophils Absolute: 0.1 10*3/uL (ref 0.0–0.5)
Eosinophils Relative: 2 %
HCT: 37.2 % (ref 36.0–46.0)
Hemoglobin: 12.8 g/dL (ref 12.0–15.0)
Immature Granulocytes: 0 %
Lymphocytes Relative: 35 %
Lymphs Abs: 1.9 10*3/uL (ref 0.7–4.0)
MCH: 33.4 pg (ref 26.0–34.0)
MCHC: 34.4 g/dL (ref 30.0–36.0)
MCV: 97.1 fL (ref 80.0–100.0)
Monocytes Absolute: 0.5 10*3/uL (ref 0.1–1.0)
Monocytes Relative: 9 %
Neutro Abs: 3 10*3/uL (ref 1.7–7.7)
Neutrophils Relative %: 53 %
Platelet Count: 364 10*3/uL (ref 150–400)
RBC: 3.83 MIL/uL — ABNORMAL LOW (ref 3.87–5.11)
RDW: 16.3 % — ABNORMAL HIGH (ref 11.5–15.5)
WBC Count: 5.5 10*3/uL (ref 4.0–10.5)
nRBC: 0 % (ref 0.0–0.2)

## 2021-12-08 LAB — CMP (CANCER CENTER ONLY)
ALT: 18 U/L (ref 0–44)
AST: 21 U/L (ref 15–41)
Albumin: 4.4 g/dL (ref 3.5–5.0)
Alkaline Phosphatase: 75 U/L (ref 38–126)
Anion gap: 8 (ref 5–15)
BUN: 18 mg/dL (ref 6–20)
CO2: 24 mmol/L (ref 22–32)
Calcium: 9.6 mg/dL (ref 8.9–10.3)
Chloride: 108 mmol/L (ref 98–111)
Creatinine: 0.75 mg/dL (ref 0.44–1.00)
GFR, Estimated: >60 mL/min (ref >60)
Glucose, Bld: 103 mg/dL — ABNORMAL HIGH (ref 70–99)
Potassium: 3.9 mmol/L (ref 3.5–5.1)
Sodium: 140 mmol/L (ref 135–145)
Total Bilirubin: 1.4 mg/dL — ABNORMAL HIGH (ref 0.3–1.2)
Total Protein: 7.7 g/dL (ref 6.5–8.1)

## 2021-12-08 NOTE — Progress Notes (Addendum)
[place]   Telephone:(336) [phone] Fax:(336) [phone]   Clinic Follow up Note   Patient Care Team: Pcp, No as PCP - General Dwan Bolt, MD as Consulting Physician (General Surgery) Truitt Merle, MD as Consulting Physician (Hematology)  Date of Service:  12/08/2021  CHIEF COMPLAINT: f/u of gallbladder cancer  CURRENT THERAPY:  Adjuvant Xeloda 1500 mg AM/2000 mg PM days 1-14 q21 days, x6 months, starting 10/02/21.   ASSESSMENT & PLAN:  Michele Arnold is a 61 y.o. female with   1. Abdominal pain, hyperbilirubinemia -her T bili has increased since starting Xeloda, and she has had increased abdominal pain in early Jan 2023 -I discussed recent abdominal MRI/MRCP, which was negative for cancer recurrence or biliary dilatation.  -Her pain has resolved and now, she is clinically doing well.  2. 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  Work-up consistent with acute cholecystitis. On cholecystectomy by Dr. Hassell Done 07/23/21, she had incidental finding of poorly differentiated adenocarcinoma in the gallbladder neck, invading perimuscular soft tissue with a positive cystic duct margin.  -Staging work-up was negative for metastatic disease, baseline CA 19-9 was normal -She subsequently underwent oncologic staging surgery, resection of positive margin, and portal lymph node dissection Dr. Zenia Resides on 09/04/21, there is no residual carcinoma.  Overall pT2a N0 M0  -Began adjuvant Xeloda 1500 mg a.m./2000 mg p.m. for 2 weeks on/1 week off, plan for 6 months, starting 10/02/21 -Xeloda was held on November 23, 2020 due to hyperbilirubinemia and abdominal pain.  This has resolved and now. -Plan to restart Xeloda at slightly reduced dose to 1500 mg twice daily on day 1-14 every 21 days, will start tonight.   3. Hypertension -On lisinopril and HCTZ,  prescribed by Eulogio Bear, DO   4. Health maintenance, disease prevention -States she was scheduled for cancer screenings in December but canceled due to her new cancer diagnosis.  She is overdue -I encouraged her to stay up-to-date on age-appropriate cancer screenings, vaccines, and live healthy active lifestyle   5. Social -She began a new job 1 week prior to her illness, she states she still has a job but not sure what her role will be when she returns -Lives with spouse, has 2 daughters. She has good family support -Followed by social work/financial for co-pay assistance with Xeloda     Plan: -Plan to restart cycle 4 Xeloda at slightly reduced dose to 1500 mg twice daily on day 1-14 every 21 days, will start tonight. -f/u in 2-3 weeks    No problem-specific Assessment & Plan notes found for this encounter.   SUMMARY OF ONCOLOGIC HISTORY: Oncology History  Adenocarcinoma of gallbladder ([place])  07/21/2021 Imaging   CT AP IMPRESSION: 1. Cholelithiasis with distended gallbladder. No CT evidence of acute cholecystitis. Consider further evaluation with right upper quadrant ultrasound as clinically indicated. 2. Punctate nonobstructive left nephrolithiasis. 3. Aortic Atherosclerosis (ICD10-I70.0).   07/21/2021 Imaging   ABD [place] RUQ IMPRESSION: 1. Cholelithiasis without evidence of acute cholecystitis. 2. Mild hepatic steatosis.     07/21/2021 Imaging   MRA/MRCP IMPRESSION: 1. No biliary ductal dilation. No choledocholithiasis. 2. Mild hepatic steatosis. 3. Cholelithiasis with dilation of the gallbladder and trace pericholecystic fluid. No gallbladder wall thickening or abnormal wall enhancement. Findings which are equivocal for acute cholecystitis. Consider further evaluation with nuclear medicine HIDA scan to assess cystic duct patency if clinically indicated.   07/22/2021 Imaging  HIDA SCAN IMPRESSION: Scintigraphic findings most consistent with acute cholecystitis.   07/23/2021  Surgery   Preop Dx:        Acute cholecystitis Postop Dx:      Acute cholecystitis with 1 cm stone impacted in the infundibulum Procedure:      Xi robotic cholecystectomy with ICG By Dr. Johnathan Hausen   07/23/2021 Pathology Results   FINAL MICROSCOPIC DIAGNOSIS:  A. GALLBLADDER, CHOLECYSTECTOMY:  - Invasive poorly differentiated adenocarcinoma, 4.3 cm, involving  gallbladder neck  - Carcinoma invades perimuscular soft tissue  - Cystic duct margin is focally positive for carcinoma  - Focally suspicious for lymphovascular invasion   Procedure: Cholecystectomy  Tumor Site: Gallbladder neck  Tumor Size: 4.3 cm  Histologic Type: Adenocarcinoma  Histologic Grade: G3: Poorly differentiated  Tumor Extension: Tumor invades perimuscular connective tissue on the peritoneal side without serosal involvement  Margins:       Margin Status for Invasive Carcinoma: Cystic duct margin is positive for carcinoma  Regional Lymph Nodes: Not applicable (no lymph nodes submitted or found)  Distant Metastasis:       Distant Site(s) Involved: Not applicable  Pathologic Stage Classification (pTNM, AJCC 8th Edition): pT2a, pN not assigned    08/20/2021 Imaging   CT chest IMPRESSION: Negative. No CT evidence for acute intrathoracic abnormality. Negative for pulmonary nodule or evidence for metastatic disease to the chest.   09/01/2021 Tumor Marker   CA 19-9: 8 (normal)   09/04/2021 Initial Diagnosis   Adenocarcinoma of gallbladder ([place])   09/04/2021 Definitive Surgery   Procedure: by Dr. Michaelle Birks Staging laparoscopy Exploratory laparotomy with intraoperative cholangiogram Excision of new cystic duct margin Partial central hepatectomy (resection of gallbladder fossa - segments 4b and 5) Portal lymph node dissection   09/04/2021 Pathology Results   FINAL MICROSCOPIC DIAGNOSIS:  A. CYSTIC DUCT, NEW MARGIN, EXCISION:  -  No adenocarcinoma identified  B. LYMPH NODE, PORTAL, EXCISION:  -  Nodular  fat necrosis and fibrosis  -  No nodal tissue identified  C. GALLBLADDER, FOSSA, EXCISION:  -  Benign liver with periductular chronic inflammation and  macrovesicular steatosis  -  No adenocarcinoma identified  D. LYMPH NODE, STATION EIGHT, EXCISION:  -  No adenocarcinoma identified in one lymph node (0/1)    09/04/2021 Cancer Staging   Staging form: Gallbladder, AJCC 8th Edition - Clinical stage from 09/04/2021: Stage IIA (cT2a, cN0, cM0) - Signed by Alla Feeling, NP on 09/21/2021 Stage prefix: Initial diagnosis Total positive nodes: 0 Histologic grade (G): G3 Histologic grading system: 3 grade system Histologic sub-type: Adenocarcinoma    09/28/2021 -  Chemotherapy   Patient is on Treatment Plan : BREAST Capecitabine q21d        INTERVAL HISTORY:  Michele Arnold is here for a follow up of gallbladder cancer. She was last seen by me on 11/23/2021. She presents to the clinic accompanied by our interpreter Almyra Free.  She is clinically doing well, abdominal pain has resolved, urine is clear now.  No other new complaints.   All other systems were reviewed with the patient and are negative.  MEDICAL HISTORY:  Past Medical History:  Diagnosis Date   Cancer [facility])    Hypertension     SURGICAL HISTORY: Past Surgical History:  Procedure Laterality Date   CHOLECYSTECTOMY  07/23/2021   INTRAOPERATIVE CHOLANGIOGRAM N/A 09/04/2021   Procedure: INTRAOPERATIVE CHOLANGIOGRAM;  Surgeon: Dwan Bolt, MD;  Location: [place];  Service: General;  Laterality: N/A;   LAPAROSCOPY N/A 09/04/2021  Procedure: STAGING LAPAROSCOPY;  Surgeon: Dwan Bolt, MD;  Location: [place];  Service: General;  Laterality: N/A;   LYMPH NODE DISSECTION N/A 09/04/2021   Procedure: PORTAL LYMPH NODE DISSECTION;  Surgeon: Dwan Bolt, MD;  Location: [place];  Service: General;  Laterality: N/A;   OPEN HEPATECTOMY  N/A 09/04/2021   Procedure: OPEN PARTIAL CENTRAL HEPATECTOMY;  Surgeon: Dwan Bolt,  MD;  Location: [place];  Service: General;  Laterality: N/A;    I have reviewed the social history and family history with the patient and they are unchanged from previous note.  ALLERGIES:  has No Known Allergies.  MEDICATIONS:  Current Outpatient Medications  Medication Sig Dispense Refill   acetaminophen (TYLENOL) 325 MG tablet Take 2 tablets (650 mg total) by mouth every 6 (six) hours as needed for mild pain or fever.     capecitabine (XELODA) 500 MG tablet Take 3 tabs in morning and 4 tabs in evening,every 12 hours,on day 1-14 every 21 days. 98 tablet 0   hydrochlorothiazide (MICROZIDE) 12.5 MG capsule Take 12.5 mg by mouth daily.     lisinopril (ZESTRIL) 10 MG tablet Take 1 tablet (10 mg total) by mouth daily. 30 tablet 0   pantoprazole (PROTONIX) 40 MG tablet Take 1 tablet (40 mg total) by mouth daily. 30 tablet 0   prochlorperazine (COMPAZINE) 10 MG tablet Take 10 mg by mouth every 6 (six) hours as needed. (Patient not taking: Reported on 10/13/2021)     traMADol (ULTRAM) 50 MG tablet Take 1 tablet (50 mg total) by mouth every 6 (six) hours as needed. 30 tablet 0   No current facility-administered medications for this visit.    PHYSICAL EXAMINATION: ECOG PERFORMANCE STATUS: 1 - Symptomatic but completely ambulatory  Vitals:   12/08/21 1437  BP: (!) 148/70  Pulse: 77  Resp: 18  Temp: 98.4 F (36.9 C)  SpO2: 100%   Wt Readings from Last 3 Encounters:  12/08/21 156 lb 11.2 oz (71.1 kg)  11/23/21 158 lb 14.4 oz (72.1 kg)  11/20/21 156 lb 12.8 oz (71.1 kg)     GENERAL:alert, no distress and comfortable SKIN: skin color, texture, turgor are normal, no rashes or significant lesions EYES: normal, Conjunctiva are pink and non-injected, sclera clear  NECK: supple, thyroid normal size, non-tender, without nodularity LYMPH:  no palpable lymphadenopathy in the cervical, axillary  LUNGS: clear to auscultation and percussion with normal breathing effort HEART: regular rate &  rhythm and no murmurs and no lower extremity edema ABDOMEN:abdomen soft, non-tender and normal bowel sounds Musculoskeletal:no cyanosis of digits and no clubbing  NEURO: alert & oriented x 3 with fluent speech, no focal motor/sensory deficits  LABORATORY DATA:  I have reviewed the data as listed CBC Latest Ref Rng & Units 12/08/2021 11/23/2021 11/20/2021  WBC 4.0 - 10.5 K/uL 5.5 6.6 7.1  Hemoglobin 12.0 - 15.0 g/dL 12.8 13.0 13.6  Hematocrit 36.0 - 46.0 % 37.2 38.2 39.7  Platelets 150 - 400 K/uL 364 373 370     CMP Latest Ref Rng & Units 12/08/2021 11/23/2021 11/20/2021  Glucose 70 - 99 mg/dL 103(H) 101(H) 102(H)  BUN 6 - 20 mg/dL 18 20 15   Creatinine 0.44 - 1.00 mg/dL 0.75 0.73 0.71  Sodium 135 - 145 mmol/L 140 139 138  Potassium 3.5 - 5.1 mmol/L 3.9 3.8 3.8  Chloride 98 - 111 mmol/L 108 106 105  CO2 22 - 32 mmol/L 24 26 25   Calcium 8.9 - 10.3 mg/dL 9.6 9.6 9.7  Total Protein 6.5 - 8.1 g/dL 7.7 7.8 8.1  Total Bilirubin 0.3 - 1.2 mg/dL 1.4(H) 1.8(H) 2.4(H)  Alkaline Phos 38 - 126 U/L 75 69 80  AST 15 - 41 U/L 21 24 28   ALT 0 - 44 U/L 18 23 24       RADIOGRAPHIC STUDIES: I have personally reviewed the radiological images as listed and agreed with the findings in the report. No results found.    No orders of the defined types were placed in this encounter.  All questions were answered. The patient knows to call the clinic with any problems, questions or concerns. No barriers to learning was detected. The total time spent in the appointment was 30 minutes.     Truitt Merle, MD 12/08/2021   I, Wilburn Mylar, am acting as scribe for Truitt Merle, MD.   I have reviewed the above documentation for accuracy and completeness, and I agree with the above.

## 2021-12-16 ENCOUNTER — Other Ambulatory Visit: Payer: Self-pay | Admitting: Hematology

## 2021-12-16 ENCOUNTER — Encounter: Payer: Self-pay | Admitting: Hematology

## 2021-12-16 ENCOUNTER — Other Ambulatory Visit (HOSPITAL_COMMUNITY): Payer: Self-pay

## 2021-12-16 MED ORDER — CAPECITABINE 500 MG PO TABS
ORAL_TABLET | ORAL | 1 refills | Status: DC
  Filled 2021-12-16: qty 84, 21d supply, fill #0
  Filled 2022-01-14: qty 84, 21d supply, fill #1

## 2021-12-17 ENCOUNTER — Other Ambulatory Visit (HOSPITAL_COMMUNITY): Payer: Self-pay

## 2021-12-17 ENCOUNTER — Encounter: Payer: Self-pay | Admitting: Hematology

## 2021-12-30 ENCOUNTER — Encounter: Payer: Self-pay | Admitting: Hematology

## 2021-12-30 ENCOUNTER — Inpatient Hospital Stay: Payer: Self-pay | Attending: Nurse Practitioner

## 2021-12-30 ENCOUNTER — Inpatient Hospital Stay (HOSPITAL_BASED_OUTPATIENT_CLINIC_OR_DEPARTMENT_OTHER): Payer: 59 | Admitting: Hematology

## 2021-12-30 ENCOUNTER — Other Ambulatory Visit: Payer: Self-pay

## 2021-12-30 VITALS — BP 149/70 | HR 85 | Temp 98.6°F | Resp 18 | Ht 64.0 in | Wt 159.8 lb

## 2021-12-30 DIAGNOSIS — C23 Malignant neoplasm of gallbladder: Secondary | ICD-10-CM

## 2021-12-30 DIAGNOSIS — I10 Essential (primary) hypertension: Secondary | ICD-10-CM | POA: Insufficient documentation

## 2021-12-30 LAB — CMP (CANCER CENTER ONLY)
ALT: 18 U/L (ref 0–44)
AST: 23 U/L (ref 15–41)
Albumin: 4.4 g/dL (ref 3.5–5.0)
Alkaline Phosphatase: 81 U/L (ref 38–126)
Anion gap: 6 (ref 5–15)
BUN: 15 mg/dL (ref 6–20)
CO2: 27 mmol/L (ref 22–32)
Calcium: 9.4 mg/dL (ref 8.9–10.3)
Chloride: 105 mmol/L (ref 98–111)
Creatinine: 0.77 mg/dL (ref 0.44–1.00)
GFR, Estimated: >60 mL/min (ref >60)
Glucose, Bld: 101 mg/dL — ABNORMAL HIGH (ref 70–99)
Potassium: 3.9 mmol/L (ref 3.5–5.1)
Sodium: 138 mmol/L (ref 135–145)
Total Bilirubin: 1.8 mg/dL — ABNORMAL HIGH (ref 0.3–1.2)
Total Protein: 7.5 g/dL (ref 6.5–8.1)

## 2021-12-30 LAB — CBC WITH DIFFERENTIAL (CANCER CENTER ONLY)
Abs Immature Granulocytes: 0 10*3/uL (ref 0.00–0.07)
Basophils Absolute: 0 10*3/uL (ref 0.0–0.1)
Basophils Relative: 1 %
Eosinophils Absolute: 0.2 10*3/uL (ref 0.0–0.5)
Eosinophils Relative: 3 %
HCT: 37.4 % (ref 36.0–46.0)
Hemoglobin: 13.3 g/dL (ref 12.0–15.0)
Immature Granulocytes: 0 %
Lymphocytes Relative: 32 %
Lymphs Abs: 1.8 10*3/uL (ref 0.7–4.0)
MCH: 34.8 pg — ABNORMAL HIGH (ref 26.0–34.0)
MCHC: 35.6 g/dL (ref 30.0–36.0)
MCV: 97.9 fL (ref 80.0–100.0)
Monocytes Absolute: 0.6 10*3/uL (ref 0.1–1.0)
Monocytes Relative: 10 %
Neutro Abs: 3 10*3/uL (ref 1.7–7.7)
Neutrophils Relative %: 54 %
Platelet Count: 389 10*3/uL (ref 150–400)
RBC: 3.82 MIL/uL — ABNORMAL LOW (ref 3.87–5.11)
RDW: 16.7 % — ABNORMAL HIGH (ref 11.5–15.5)
WBC Count: 5.5 10*3/uL (ref 4.0–10.5)
nRBC: 0 % (ref 0.0–0.2)

## 2021-12-30 NOTE — Progress Notes (Signed)
[place]   Telephone:(336) [phone] Fax:(336) [phone]   Clinic Follow up Note   Patient Care Team: Geradine Girt, DO as PCP - General (Internal Medicine) Dwan Bolt, MD as Consulting Physician (General Surgery) Truitt Merle, MD as Consulting Physician (Hematology)  Date of Service:  12/30/2021  CHIEF COMPLAINT: f/u of gallbladder cancer  CURRENT THERAPY:  Adjuvant Xeloda 1500 mg AM/2000 mg PM days 1-14 q21 days, x6 months, starting 10/02/21.   ASSESSMENT & PLAN:  Michele Arnold is a 61 y.o. female with   1. 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  Work-up consistent with acute cholecystitis. On cholecystectomy by Dr. Hassell Done 07/23/21, she had incidental finding of poorly differentiated adenocarcinoma in the gallbladder neck, invading perimuscular soft tissue with a positive cystic duct margin.  -Staging work-up was negative for metastatic disease, baseline CA 19-9 was normal -She subsequently underwent oncologic staging surgery, resection of positive margin, and portal lymph node dissection Dr. Zenia Resides on 09/04/21, there is no residual carcinoma.  Overall pT2a N0 M0  -Began adjuvant Xeloda 1500 mg a.m./2000 mg p.m. for 2 weeks on/1 week off, plan for 6 months, starting 10/02/21. Held in 11/2021 for hyperbilirubinemia and abdominal pain (MRI was negative). -she continues Xeloda at 1500 mg twice daily on day 1-14 every 21 days. She started a new cycle today. She tolerated very well overall. No recurrent abdominal pain. CMP is still pending    2. Hypertension -On lisinopril and HCTZ, prescribed by Eulogio Bear, DO   3. Health maintenance, disease prevention -States she was scheduled for cancer screenings in December but canceled due to her new cancer diagnosis.  She is overdue -I encouraged her to stay up-to-date on age-appropriate  cancer screenings, vaccines, and live healthy active lifestyle   4. Social -She began a new job 1 week prior to her illness, she states she still has a job but not sure what her role will be when she returns -Lives with spouse, has 2 daughters. She has good family support -Followed by social work/financial for co-pay assistance with Xeloda     Plan: -continue Xeloda at 1500 mg twice daily on day 1-14 every 21 days -lab and f/u in 3 weeks    No problem-specific Assessment & Plan notes found for this encounter.   SUMMARY OF ONCOLOGIC HISTORY: Oncology History  Adenocarcinoma of gallbladder ([place])  07/21/2021 Imaging   CT AP IMPRESSION: 1. Cholelithiasis with distended gallbladder. No CT evidence of acute cholecystitis. Consider further evaluation with right upper quadrant ultrasound as clinically indicated. 2. Punctate nonobstructive left nephrolithiasis. 3. Aortic Atherosclerosis (ICD10-I70.0).   07/21/2021 Imaging   ABD [place] RUQ IMPRESSION: 1. Cholelithiasis without evidence of acute cholecystitis. 2. Mild hepatic steatosis.     07/21/2021 Imaging   MRA/MRCP IMPRESSION: 1. No biliary ductal dilation. No choledocholithiasis. 2. Mild hepatic steatosis. 3. Cholelithiasis with dilation of the gallbladder and trace pericholecystic fluid. No gallbladder wall thickening or abnormal wall enhancement. Findings which are equivocal for acute cholecystitis. Consider further evaluation with nuclear medicine HIDA scan to assess cystic duct patency if clinically indicated.   07/22/2021 Imaging   HIDA SCAN IMPRESSION: Scintigraphic findings most consistent with acute cholecystitis.   07/23/2021 Surgery   Preop Dx:        Acute cholecystitis Postop Dx:      Acute cholecystitis with 1 cm stone impacted in the infundibulum Procedure:  Xi robotic cholecystectomy with ICG By Dr. Johnathan Hausen   07/23/2021 Pathology Results   FINAL MICROSCOPIC DIAGNOSIS:  A. GALLBLADDER, CHOLECYSTECTOMY:  -  Invasive poorly differentiated adenocarcinoma, 4.3 cm, involving  gallbladder neck  - Carcinoma invades perimuscular soft tissue  - Cystic duct margin is focally positive for carcinoma  - Focally suspicious for lymphovascular invasion   Procedure: Cholecystectomy  Tumor Site: Gallbladder neck  Tumor Size: 4.3 cm  Histologic Type: Adenocarcinoma  Histologic Grade: G3: Poorly differentiated  Tumor Extension: Tumor invades perimuscular connective tissue on the peritoneal side without serosal involvement  Margins:       Margin Status for Invasive Carcinoma: Cystic duct margin is positive for carcinoma  Regional Lymph Nodes: Not applicable (no lymph nodes submitted or found)  Distant Metastasis:       Distant Site(s) Involved: Not applicable  Pathologic Stage Classification (pTNM, AJCC 8th Edition): pT2a, pN not assigned    08/20/2021 Imaging   CT chest IMPRESSION: Negative. No CT evidence for acute intrathoracic abnormality. Negative for pulmonary nodule or evidence for metastatic disease to the chest.   09/01/2021 Tumor Marker   CA 19-9: 8 (normal)   09/04/2021 Initial Diagnosis   Adenocarcinoma of gallbladder ([place])   09/04/2021 Definitive Surgery   Procedure: by Dr. Michaelle Birks Staging laparoscopy Exploratory laparotomy with intraoperative cholangiogram Excision of new cystic duct margin Partial central hepatectomy (resection of gallbladder fossa - segments 4b and 5) Portal lymph node dissection   09/04/2021 Pathology Results   FINAL MICROSCOPIC DIAGNOSIS:  A. CYSTIC DUCT, NEW MARGIN, EXCISION:  -  No adenocarcinoma identified  B. LYMPH NODE, PORTAL, EXCISION:  -  Nodular fat necrosis and fibrosis  -  No nodal tissue identified  C. GALLBLADDER, FOSSA, EXCISION:  -  Benign liver with periductular chronic inflammation and  macrovesicular steatosis  -  No adenocarcinoma identified  D. LYMPH NODE, STATION EIGHT, EXCISION:  -  No adenocarcinoma identified in one lymph node  (0/1)    09/04/2021 Cancer Staging   Staging form: Gallbladder, AJCC 8th Edition - Clinical stage from 09/04/2021: Stage IIA (cT2a, cN0, cM0) - Signed by Alla Feeling, NP on 09/21/2021 Stage prefix: Initial diagnosis Total positive nodes: 0 Histologic grade (G): G3 Histologic grading system: 3 grade system Histologic sub-type: Adenocarcinoma    09/28/2021 -  Chemotherapy   Patient is on Treatment Plan : BREAST Capecitabine q21d        INTERVAL HISTORY:  Michele Arnold is here for a follow up of gallbladder cancer. She was last seen by me on 12/08/21. She presents to the clinic accompanied by an interpreter. She reports she feels well overall. She denies any problems from Xeloda, but does note skin darkening and peeling. She notes it used to hurt when she walked, but it does not hurt anymore. She endorses using imodium and tylenol only as needed.   All other systems were reviewed with the patient and are negative.  MEDICAL HISTORY:  Past Medical History:  Diagnosis Date   Cancer [facility])    Hypertension     SURGICAL HISTORY: Past Surgical History:  Procedure Laterality Date   CHOLECYSTECTOMY  07/23/2021   INTRAOPERATIVE CHOLANGIOGRAM N/A 09/04/2021   Procedure: INTRAOPERATIVE CHOLANGIOGRAM;  Surgeon: Dwan Bolt, MD;  Location: [place];  Service: General;  Laterality: N/A;   LAPAROSCOPY N/A 09/04/2021   Procedure: STAGING LAPAROSCOPY;  Surgeon: Dwan Bolt, MD;  Location: [place];  Service: General;  Laterality: N/A;   LYMPH NODE DISSECTION N/A 09/04/2021  Procedure: PORTAL LYMPH NODE DISSECTION;  Surgeon: Dwan Bolt, MD;  Location: [place];  Service: General;  Laterality: N/A;   OPEN HEPATECTOMY  N/A 09/04/2021   Procedure: OPEN PARTIAL CENTRAL HEPATECTOMY;  Surgeon: Dwan Bolt, MD;  Location: [place];  Service: General;  Laterality: N/A;    I have reviewed the social history and family history with the patient and they are unchanged from previous  note.  ALLERGIES:  has No Known Allergies.  MEDICATIONS:  Current Outpatient Medications  Medication Sig Dispense Refill   acetaminophen (TYLENOL) 325 MG tablet Take 2 tablets (650 mg total) by mouth every 6 (six) hours as needed for mild pain or fever.     capecitabine (XELODA) 500 MG tablet Take 3 tabs every 12 hours on day 1-14 every 21 days. 84 tablet 1   hydrochlorothiazide (MICROZIDE) 12.5 MG capsule Take 12.5 mg by mouth daily.     lisinopril (ZESTRIL) 10 MG tablet Take 1 tablet (10 mg total) by mouth daily. 30 tablet 0   prochlorperazine (COMPAZINE) 10 MG tablet Take 10 mg by mouth every 6 (six) hours as needed. (Patient not taking: Reported on 10/13/2021)     No current facility-administered medications for this visit.    PHYSICAL EXAMINATION: ECOG PERFORMANCE STATUS: 0 - Asymptomatic  Vitals:   12/30/21 1028  BP: (!) 149/70  Pulse: 85  Resp: 18  Temp: 98.6 F (37 C)  SpO2: 100%   Wt Readings from Last 3 Encounters:  12/30/21 159 lb 12.8 oz (72.5 kg)  12/08/21 156 lb 11.2 oz (71.1 kg)  11/23/21 158 lb 14.4 oz (72.1 kg)     GENERAL:alert, no distress and comfortable SKIN: skin color, texture, turgor are normal, no rashes or significant lesions EYES: normal, Conjunctiva are pink and non-injected, sclera clear  LUNGS: clear to auscultation and percussion with normal breathing effort HEART: regular rate & rhythm and no murmurs and no lower extremity edema ABDOMEN:abdomen soft, non-tender and normal bowel sounds Musculoskeletal:no cyanosis of digits and no clubbing  NEURO: alert & oriented x 3 with fluent speech, no focal motor/sensory deficits  LABORATORY DATA:  I have reviewed the data as listed CBC Latest Ref Rng & Units 12/30/2021 12/08/2021 11/23/2021  WBC 4.0 - 10.5 K/uL 5.5 5.5 6.6  Hemoglobin 12.0 - 15.0 g/dL 13.3 12.8 13.0  Hematocrit 36.0 - 46.0 % 37.4 37.2 38.2  Platelets 150 - 400 K/uL 389 364 373     CMP Latest Ref Rng & Units 12/08/2021 11/23/2021  11/20/2021  Glucose 70 - 99 mg/dL 103(H) 101(H) 102(H)  BUN 6 - 20 mg/dL 18 20 15   Creatinine 0.44 - 1.00 mg/dL 0.75 0.73 0.71  Sodium 135 - 145 mmol/L 140 139 138  Potassium 3.5 - 5.1 mmol/L 3.9 3.8 3.8  Chloride 98 - 111 mmol/L 108 106 105  CO2 22 - 32 mmol/L 24 26 25   Calcium 8.9 - 10.3 mg/dL 9.6 9.6 9.7  Total Protein 6.5 - 8.1 g/dL 7.7 7.8 8.1  Total Bilirubin 0.3 - 1.2 mg/dL 1.4(H) 1.8(H) 2.4(H)  Alkaline Phos 38 - 126 U/L 75 69 80  AST 15 - 41 U/L 21 24 28   ALT 0 - 44 U/L 18 23 24       RADIOGRAPHIC STUDIES: I have personally reviewed the radiological images as listed and agreed with the findings in the report. No results found.    No orders of the defined types were placed in this encounter.  All questions were answered. The patient knows  to call the clinic with any problems, questions or concerns. No barriers to learning was detected. The total time spent in the appointment was 30 minutes.     Truitt Merle, MD 12/30/2021   I, Wilburn Mylar, am acting as scribe for Truitt Merle, MD.   I have reviewed the above documentation for accuracy and completeness, and I agree with the above.

## 2022-01-04 ENCOUNTER — Other Ambulatory Visit (HOSPITAL_COMMUNITY): Payer: Self-pay

## 2022-01-14 ENCOUNTER — Other Ambulatory Visit (HOSPITAL_COMMUNITY): Payer: Self-pay

## 2022-01-14 ENCOUNTER — Encounter: Payer: Self-pay | Admitting: Hematology

## 2022-01-18 ENCOUNTER — Other Ambulatory Visit (HOSPITAL_COMMUNITY): Payer: Self-pay

## 2022-01-18 ENCOUNTER — Encounter: Payer: Self-pay | Admitting: Hematology

## 2022-01-20 NOTE — Progress Notes (Signed)
[name]   Telephone:(336) [phone] Fax:(336) [phone]   Clinic Follow up Note   Patient Care Team: Geradine Girt, DO as PCP - General (Internal Medicine) Dwan Bolt, MD as Consulting Physician (General Surgery) Truitt Merle, MD as Consulting Physician (Hematology) 01/21/2022  CHIEF COMPLAINT: Follow-up gallbladder cancer  SUMMARY OF ONCOLOGIC HISTORY: Oncology History  Adenocarcinoma of gallbladder ([place])  07/21/2021 Imaging   CT AP IMPRESSION: 1. Cholelithiasis with distended gallbladder. No CT evidence of acute cholecystitis. Consider further evaluation with right upper quadrant ultrasound as clinically indicated. 2. Punctate nonobstructive left nephrolithiasis. 3. Aortic Atherosclerosis (ICD10-I70.0).   07/21/2021 Imaging   ABD [place] RUQ IMPRESSION: 1. Cholelithiasis without evidence of acute cholecystitis. 2. Mild hepatic steatosis.     07/21/2021 Imaging   MRA/MRCP IMPRESSION: 1. No biliary ductal dilation. No choledocholithiasis. 2. Mild hepatic steatosis. 3. Cholelithiasis with dilation of the gallbladder and trace pericholecystic fluid. No gallbladder wall thickening or abnormal wall enhancement. Findings which are equivocal for acute cholecystitis. Consider further evaluation with nuclear medicine HIDA scan to assess cystic duct patency if clinically indicated.   07/22/2021 Imaging   HIDA SCAN IMPRESSION: Scintigraphic findings most consistent with acute cholecystitis.   07/23/2021 Surgery   Preop Dx:        Acute cholecystitis Postop Dx:      Acute cholecystitis with 1 cm stone impacted in the infundibulum Procedure:      Xi robotic cholecystectomy with ICG By Dr. Johnathan Hausen   07/23/2021 Pathology Results   FINAL MICROSCOPIC DIAGNOSIS:  A. GALLBLADDER, CHOLECYSTECTOMY:  - Invasive poorly differentiated adenocarcinoma, 4.3 cm, involving  gallbladder neck  - Carcinoma invades perimuscular soft tissue  - Cystic duct margin is focally positive for  carcinoma  - Focally suspicious for lymphovascular invasion   Procedure: Cholecystectomy  Tumor Site: Gallbladder neck  Tumor Size: 4.3 cm  Histologic Type: Adenocarcinoma  Histologic Grade: G3: Poorly differentiated  Tumor Extension: Tumor invades perimuscular connective tissue on the peritoneal side without serosal involvement  Margins:       Margin Status for Invasive Carcinoma: Cystic duct margin is positive for carcinoma  Regional Lymph Nodes: Not applicable (no lymph nodes submitted or found)  Distant Metastasis:       Distant Site(s) Involved: Not applicable  Pathologic Stage Classification (pTNM, AJCC 8th Edition): pT2a, pN not assigned    08/20/2021 Imaging   CT chest IMPRESSION: Negative. No CT evidence for acute intrathoracic abnormality. Negative for pulmonary nodule or evidence for metastatic disease to the chest.   09/01/2021 Tumor Marker   CA 19-9: 8 (normal)   09/04/2021 Initial Diagnosis   Adenocarcinoma of gallbladder ([facility])   09/04/2021 Definitive Surgery   Procedure: by Dr. Michaelle Birks Staging laparoscopy Exploratory laparotomy with intraoperative cholangiogram Excision of new cystic duct margin Partial central hepatectomy (resection of gallbladder fossa - segments 4b and 5) Portal lymph node dissection   09/04/2021 Pathology Results   FINAL MICROSCOPIC DIAGNOSIS:  A. CYSTIC DUCT, NEW MARGIN, EXCISION:  -  No adenocarcinoma identified  B. LYMPH NODE, PORTAL, EXCISION:  -  Nodular fat necrosis and fibrosis  -  No nodal tissue identified  C. GALLBLADDER, FOSSA, EXCISION:  -  Benign liver with periductular chronic inflammation and  macrovesicular steatosis  -  No adenocarcinoma identified  D. LYMPH NODE, STATION EIGHT, EXCISION:  -  No adenocarcinoma identified in one lymph node (0/1)    09/04/2021 Cancer Staging   Staging form: Gallbladder, AJCC 8th Edition - Clinical stage from 09/04/2021:  Stage IIA (cT2a, cN0, cM0) - Signed by Alla Feeling, NP  on 09/21/2021 Stage prefix: Initial diagnosis Total positive nodes: 0 Histologic grade (G): G3 Histologic grading system: 3 grade system Histologic sub-type: Adenocarcinoma    09/28/2021 -  Chemotherapy   Patient is on Treatment Plan : BREAST Capecitabine q21d       CURRENT THERAPY: Adjuvant Xeloda 1500 mg AM/2000 mg PM days 1-14 q21 days, x6 months, starting 10/02/21.  Reduced to 1500 mg twice daily on 12/08/2021 due to abdominal pain  INTERVAL HISTORY: [name] returns for follow-up as scheduled.  Last seen by Dr. Burr Medico 12/30/2021.  She was just off Xeloda for a week and resumed 01/20/2022.  Her only complaint is hand/feet darkening, dryness, and some peeling.  There is mild occasional discomfort only in the thumbs.  There are no blisters or significant skin breakdown.  She uses aloe vera lotion which makes her hands feel hot.  She still functions normally.  She otherwise feels well, eating and drinking normally.  Bowels moving.  Mild occasional abdominal discomfort is diet related.  Denies mucositis, nausea, fever, chills, cough, chest pain, dyspnea, or other new complaints.    MEDICAL HISTORY:  Past Medical History:  Diagnosis Date   Cancer [facility])    Hypertension     SURGICAL HISTORY: Past Surgical History:  Procedure Laterality Date   CHOLECYSTECTOMY  07/23/2021   INTRAOPERATIVE CHOLANGIOGRAM N/A 09/04/2021   Procedure: INTRAOPERATIVE CHOLANGIOGRAM;  Surgeon: Dwan Bolt, MD;  Location: [place];  Service: General;  Laterality: N/A;   LAPAROSCOPY N/A 09/04/2021   Procedure: STAGING LAPAROSCOPY;  Surgeon: Dwan Bolt, MD;  Location: [place];  Service: General;  Laterality: N/A;   LYMPH NODE DISSECTION N/A 09/04/2021   Procedure: PORTAL LYMPH NODE DISSECTION;  Surgeon: Dwan Bolt, MD;  Location: [place];  Service: General;  Laterality: N/A;   OPEN HEPATECTOMY  N/A 09/04/2021   Procedure: OPEN PARTIAL CENTRAL HEPATECTOMY;  Surgeon: Dwan Bolt, MD;  Location: [place];  Service: General;  Laterality: N/A;    I have reviewed the social history and family history with the patient and they are unchanged from previous note.  ALLERGIES:  has No Known Allergies.  MEDICATIONS:  Current Outpatient Medications  Medication Sig Dispense Refill   acetaminophen (TYLENOL) 325 MG tablet Take 2 tablets (650 mg total) by mouth every 6 (six) hours as needed for mild pain or fever.     capecitabine (XELODA) 500 MG tablet Take 3 tabs every 12 hours on day 1-14 every 21 days. 84 tablet 1   hydrochlorothiazide (MICROZIDE) 12.5 MG capsule Take 12.5 mg by mouth daily.     lisinopril (ZESTRIL) 10 MG tablet Take 1 tablet (10 mg total) by mouth daily. 30 tablet 0   urea (CARMOL) 10 % cream Apply topically to hands and feet 2 to 3 times daily as needed 71 g 2   prochlorperazine (COMPAZINE) 10 MG tablet Take 10 mg by mouth every 6 (six) hours as needed.     No current facility-administered medications for this visit.    PHYSICAL EXAMINATION: ECOG PERFORMANCE STATUS: 1 - Symptomatic but completely ambulatory  Vitals:   01/21/22 1005  BP: (!) 147/75  Pulse: 83  Resp: 18  Temp: 97.7 F (36.5 C)  SpO2: 100%   Filed Weights   01/21/22 1005  Weight: 160 lb 3.2 oz (72.7 kg)    GENERAL:alert, no distress and comfortable SKIN: Palms and soles dry with mild hyperpigmentation, mild  flaking interdigits on the thumbs EYES:  sclera clear LUNGS:  normal breathing effort HEART: no lower extremity edema NEURO: alert & oriented x 3 with fluent speech, no focal motor deficits  LABORATORY DATA:  I have reviewed the data as listed CBC Latest Ref Rng & Units 01/21/2022 12/30/2021 12/08/2021  WBC 4.0 - 10.5 K/uL 5.1 5.5 5.5  Hemoglobin 12.0 - 15.0 g/dL 13.3 13.3 12.8  Hematocrit 36.0 - 46.0 % 37.9 37.4 37.2  Platelets 150 - 400 K/uL 364 389 364     CMP Latest Ref Rng & Units 01/21/2022 12/30/2021 12/08/2021  Glucose 70 - 99 mg/dL 68(L) 101(H) 103(H)  BUN 6 - 20 mg/dL $Remove'12 15 18   'PtrLNQQ$ Creatinine 0.44 - 1.00 mg/dL 0.67 0.77 0.75  Sodium 135 - 145 mmol/L 140 138 140  Potassium 3.5 - 5.1 mmol/L 4.1 3.9 3.9  Chloride 98 - 111 mmol/L 107 105 108  CO2 22 - 32 mmol/L $RemoveB'27 27 24  'oTyOdQym$ Calcium 8.9 - 10.3 mg/dL 9.6 9.4 9.6  Total Protein 6.5 - 8.1 g/dL 7.4 7.5 7.7  Total Bilirubin 0.3 - 1.2 mg/dL 2.3(H) 1.8(H) 1.4(H)  Alkaline Phos 38 - 126 U/L 83 81 75  AST 15 - 41 U/L $Remo'21 23 21  'CSEye$ ALT 0 - 44 U/L $Remo'15 18 18      'letdD$ RADIOGRAPHIC STUDIES: I have personally reviewed the radiological images as listed and agreed with the findings in the report. No results found.   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  Work-up consistent with acute cholecystitis. On cholecystectomy by Dr. Hassell Done 07/23/21, she had incidental finding of poorly differentiated adenocarcinoma in the gallbladder neck, invading perimuscular soft tissue with a positive cystic duct margin.  -Staging work-up was negative for metastatic disease, baseline CA 19-9 was normal -She subsequently underwent oncologic staging surgery, resection of positive margin, and portal lymph node dissection Dr. Zenia Resides on 09/04/2021, there is no residual carcinoma.  Overall pT2a N0 M0  -Began adjuvant Xeloda 1500 mg a.m./2000 mg p.m. for 2 weeks on/1 week off, plan for 6 months, starting 10/02/2021 -Xeloda was held for abdominal pain and work-up, MRCP showed no recurrence or obstruction  -Pain resolved and she resume Xeloda 1500 mg twice daily for 2 weeks on/1 week off starting 12/08/2021 -Ms. Rosario-Diefenderfer appears stable.  She continues adjuvant Xeloda 1500 mg twice daily for 2 weeks on/1 week off, she began the current cycle 01/20/2022.   -She is tolerating treatment well with mild hand-foot syndrome, we reviewed symptom management.  I recommend to alternate hydrocortisone and urea  cream.  She is otherwise doing well with no significant side effects, able to function well with good PS.  There is no clinical concern for recurrence. -Labs reviewed, adequate to continue Xeloda -Follow-up 3/27 orders 3/28 prior to next cycle   Abdominal pain, transaminitis, hyperbilirubinemia -secondary to #1. On 07/21/21 AST 1006, ALT 530, alk phos 138, Tbili 3.4 -labs and pain improved after surgeries -mild incisional pain managed with tylenol PRN -T bili normalized after surgery then became slightly elevated again -Work-up in 11/2021 was unremarkable, no recurrent disease or other abnormality -possibly related to Xeloda.  T. bili 2.3 today, asymptomatic -Continue monitoring  3. Hypertension -On lisinopril and HCTZ, prescribed by Eulogio Bear, DO   Health maintenance, disease prevention -States she was scheduled for cancer screenings in December but canceled due to her new cancer  diagnosis.  She is overdue -I encouraged her to stay up-to-date on age-appropriate cancer screenings, vaccines, and live healthy active lifestyle   5. Social -She began a new job 1 week prior to her illness, she states she still has a job but not sure what her role will be when she returns -Lives with spouse, has 2 daughters. She has good family support -Followed by social work/financial for co-pay assistance with Xeloda  Plan: -Labs reviewed -Mild hand/foot syndrome -Reviewed symptom management, alternate hydrocortisone cream and urea cream (prescribed today), do not use together -Continue current cycle xeloda 1500 mg BID for 2 weeks on/1 week off (started 01/20/2022) -F/up 3/27 or 3/28 prior to next cycle. If hand-foot syndrome worsens or she does not recover well, will reduce chemo  All questions were answered. The patient knows to call the clinic with any problems, questions or concerns. No barriers to learning were detected with Spanish-speaking interpreter present for the entire visit.     Alla Feeling, NP 01/21/22

## 2022-01-21 ENCOUNTER — Other Ambulatory Visit (HOSPITAL_COMMUNITY): Payer: Self-pay

## 2022-01-21 ENCOUNTER — Inpatient Hospital Stay (HOSPITAL_BASED_OUTPATIENT_CLINIC_OR_DEPARTMENT_OTHER): Payer: 59 | Admitting: Nurse Practitioner

## 2022-01-21 ENCOUNTER — Other Ambulatory Visit: Payer: Self-pay

## 2022-01-21 ENCOUNTER — Encounter: Payer: Self-pay | Admitting: Nurse Practitioner

## 2022-01-21 ENCOUNTER — Inpatient Hospital Stay: Payer: 59 | Attending: Nurse Practitioner

## 2022-01-21 VITALS — BP 147/75 | HR 83 | Temp 97.7°F | Resp 18 | Ht 64.0 in | Wt 160.2 lb

## 2022-01-21 DIAGNOSIS — R109 Unspecified abdominal pain: Secondary | ICD-10-CM | POA: Insufficient documentation

## 2022-01-21 DIAGNOSIS — R7401 Elevation of levels of liver transaminase levels: Secondary | ICD-10-CM | POA: Insufficient documentation

## 2022-01-21 DIAGNOSIS — T451X5A Adverse effect of antineoplastic and immunosuppressive drugs, initial encounter: Secondary | ICD-10-CM | POA: Insufficient documentation

## 2022-01-21 DIAGNOSIS — K760 Fatty (change of) liver, not elsewhere classified: Secondary | ICD-10-CM | POA: Diagnosis not present

## 2022-01-21 DIAGNOSIS — Z79899 Other long term (current) drug therapy: Secondary | ICD-10-CM | POA: Insufficient documentation

## 2022-01-21 DIAGNOSIS — L271 Localized skin eruption due to drugs and medicaments taken internally: Secondary | ICD-10-CM | POA: Insufficient documentation

## 2022-01-21 DIAGNOSIS — N200 Calculus of kidney: Secondary | ICD-10-CM | POA: Insufficient documentation

## 2022-01-21 DIAGNOSIS — C23 Malignant neoplasm of gallbladder: Secondary | ICD-10-CM | POA: Diagnosis present

## 2022-01-21 DIAGNOSIS — K8000 Calculus of gallbladder with acute cholecystitis without obstruction: Secondary | ICD-10-CM | POA: Diagnosis not present

## 2022-01-21 DIAGNOSIS — I10 Essential (primary) hypertension: Secondary | ICD-10-CM | POA: Diagnosis not present

## 2022-01-21 LAB — CBC WITH DIFFERENTIAL (CANCER CENTER ONLY)
Abs Immature Granulocytes: 0.01 10*3/uL (ref 0.00–0.07)
Basophils Absolute: 0 10*3/uL (ref 0.0–0.1)
Basophils Relative: 1 %
Eosinophils Absolute: 0.2 10*3/uL (ref 0.0–0.5)
Eosinophils Relative: 3 %
HCT: 37.9 % (ref 36.0–46.0)
Hemoglobin: 13.3 g/dL (ref 12.0–15.0)
Immature Granulocytes: 0 %
Lymphocytes Relative: 36 %
Lymphs Abs: 1.8 10*3/uL (ref 0.7–4.0)
MCH: 35.5 pg — ABNORMAL HIGH (ref 26.0–34.0)
MCHC: 35.1 g/dL (ref 30.0–36.0)
MCV: 101.1 fL — ABNORMAL HIGH (ref 80.0–100.0)
Monocytes Absolute: 0.6 10*3/uL (ref 0.1–1.0)
Monocytes Relative: 11 %
Neutro Abs: 2.5 10*3/uL (ref 1.7–7.7)
Neutrophils Relative %: 49 %
Platelet Count: 364 10*3/uL (ref 150–400)
RBC: 3.75 MIL/uL — ABNORMAL LOW (ref 3.87–5.11)
RDW: 16.6 % — ABNORMAL HIGH (ref 11.5–15.5)
WBC Count: 5.1 10*3/uL (ref 4.0–10.5)
nRBC: 0 % (ref 0.0–0.2)

## 2022-01-21 LAB — CMP (CANCER CENTER ONLY)
ALT: 15 U/L (ref 0–44)
AST: 21 U/L (ref 15–41)
Albumin: 4.4 g/dL (ref 3.5–5.0)
Alkaline Phosphatase: 83 U/L (ref 38–126)
Anion gap: 6 (ref 5–15)
BUN: 12 mg/dL (ref 6–20)
CO2: 27 mmol/L (ref 22–32)
Calcium: 9.6 mg/dL (ref 8.9–10.3)
Chloride: 107 mmol/L (ref 98–111)
Creatinine: 0.67 mg/dL (ref 0.44–1.00)
GFR, Estimated: >60 mL/min (ref >60)
Glucose, Bld: 68 mg/dL — ABNORMAL LOW (ref 70–99)
Potassium: 4.1 mmol/L (ref 3.5–5.1)
Sodium: 140 mmol/L (ref 135–145)
Total Bilirubin: 2.3 mg/dL — ABNORMAL HIGH (ref 0.3–1.2)
Total Protein: 7.4 g/dL (ref 6.5–8.1)

## 2022-01-21 MED ORDER — UREA 10 % EX CREA
TOPICAL_CREAM | CUTANEOUS | 2 refills | Status: DC | PRN
  Filled 2022-01-21: qty 71, fill #0

## 2022-01-25 ENCOUNTER — Other Ambulatory Visit: Payer: Self-pay | Admitting: Obstetrics and Gynecology

## 2022-01-25 DIAGNOSIS — Z1231 Encounter for screening mammogram for malignant neoplasm of breast: Secondary | ICD-10-CM

## 2022-02-02 ENCOUNTER — Other Ambulatory Visit (HOSPITAL_COMMUNITY): Payer: Self-pay

## 2022-02-02 ENCOUNTER — Ambulatory Visit: Payer: Self-pay

## 2022-02-02 ENCOUNTER — Encounter: Payer: Self-pay | Admitting: Hematology

## 2022-02-02 ENCOUNTER — Other Ambulatory Visit: Payer: Self-pay | Admitting: Hematology

## 2022-02-02 MED ORDER — CAPECITABINE 500 MG PO TABS
ORAL_TABLET | ORAL | 1 refills | Status: DC
  Filled 2022-02-02: qty 84, 21d supply, fill #0
  Filled 2022-03-03: qty 84, 21d supply, fill #1

## 2022-02-05 ENCOUNTER — Encounter: Payer: Self-pay | Admitting: Hematology

## 2022-02-05 ENCOUNTER — Other Ambulatory Visit (HOSPITAL_COMMUNITY): Payer: Self-pay

## 2022-02-07 NOTE — Progress Notes (Signed)
?[place]   ?Telephone:([phone] [phone]   ?Clinic Follow up Note  ? ?Patient Care Team: ?Geradine Girt, DO as PCP - General (Internal Medicine) ?Dwan Bolt, MD as Consulting Physician (General Surgery) ?Truitt Merle, MD as Consulting Physician (Hematology) ?02/08/2022 ? ?CHIEF COMPLAINT: Follow up gallbladder cancer  ? 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 CT AP IMPRESSION: ?1. Cholelithiasis with distended gallbladder. No CT evidence of acute cholecystitis. Consider further evaluation with right upper quadrant ultrasound as clinically indicated. ?2. Punctate nonobstructive left nephrolithiasis. ?3. Aortic Atherosclerosis (ICD10-I70.0). ?  ?07/21/2021 Imaging  ? ABD [place] RUQ IMPRESSION: ?1. Cholelithiasis without evidence of acute cholecystitis. ?2. Mild hepatic steatosis. ?  ?  ?07/21/2021 Imaging  ? MRA/MRCP IMPRESSION: ?1. No biliary ductal dilation. No choledocholithiasis. ?2. Mild hepatic steatosis. ?3. Cholelithiasis with dilation of the gallbladder and trace pericholecystic fluid. No gallbladder wall thickening or abnormal wall enhancement. Findings which are equivocal for acute cholecystitis. Consider further evaluation with nuclear medicine HIDA scan to assess cystic duct patency if clinically indicated. ?  ?07/22/2021 Imaging  ? HIDA SCAN IMPRESSION: ?Scintigraphic findings most consistent with acute cholecystitis. ?  ?07/23/2021 Surgery  ? Preop Dx:        Acute cholecystitis ?Postop Dx:      Acute cholecystitis with 1 cm stone impacted in the infundibulum ?Procedure:      Xi robotic cholecystectomy with ICG ?By Dr. Johnathan Hausen ?  ?07/23/2021 Pathology Results  ? FINAL MICROSCOPIC DIAGNOSIS:  ?A. GALLBLADDER, CHOLECYSTECTOMY:  ?- Invasive poorly differentiated adenocarcinoma, 4.3 cm, involving  ?gallbladder neck  ?- Carcinoma invades perimuscular soft tissue  ?- Cystic duct margin is focally positive for  carcinoma  ?- Focally suspicious for lymphovascular invasion  ? ?Procedure: Cholecystectomy  ?Tumor Site: Gallbladder neck  ?Tumor Size: 4.3 cm  ?Histologic Type: Adenocarcinoma  ?Histologic Grade: G3: Poorly differentiated  ?Tumor Extension: Tumor invades perimuscular connective tissue on the peritoneal side without serosal involvement  ?Margins:  ?     Margin Status for Invasive Carcinoma: Cystic duct margin is positive for carcinoma  ?Regional Lymph Nodes: Not applicable (no lymph nodes submitted or found)  ?Distant Metastasis:  ?     Distant Site(s) Involved: Not applicable  ?Pathologic Stage Classification (pTNM, AJCC 8th Edition): pT2a, pN not assigned  ?  ?08/20/2021 Imaging  ? CT chest IMPRESSION: ?Negative. No CT evidence for acute intrathoracic abnormality. Negative for pulmonary nodule or evidence for metastatic disease to the chest. ?  ?09/01/2021 Tumor Marker  ? CA 19-9: 8 (normal) ?  ?09/04/2021 Initial Diagnosis  ? Adenocarcinoma of gallbladder [facility]) ?  ?09/04/2021 Definitive Surgery  ? Procedure: by Dr. Michaelle Birks ?Staging laparoscopy ?Exploratory laparotomy with intraoperative cholangiogram ?Excision of new cystic duct margin ?Partial central hepatectomy (resection of gallbladder fossa - segments 4b and 5) ?Portal lymph node dissection ?  ?09/04/2021 Pathology Results  ? FINAL MICROSCOPIC DIAGNOSIS:  ?A. CYSTIC DUCT, NEW MARGIN, EXCISION:  ?-  No adenocarcinoma identified  ?B. LYMPH NODE, PORTAL, EXCISION:  ?-  Nodular fat necrosis and fibrosis  ?-  No nodal tissue identified  ?C. GALLBLADDER, FOSSA, EXCISION:  ?-  Benign liver with periductular chronic inflammation and  ?macrovesicular steatosis  ?-  No adenocarcinoma identified  ?D. LYMPH NODE, STATION EIGHT, EXCISION:  ?-  No adenocarcinoma identified in one lymph node (0/1)  ?  ?09/04/2021 Cancer Staging  ? Staging form: Gallbladder, AJCC 8th Edition ?- Clinical stage  from 09/04/2021: Stage IIA (cT2a, cN0, cM0) - Signed by Alla Feeling, NP  on 09/21/2021 ?Stage prefix: Initial diagnosis ?Total positive nodes: 0 ?Histologic grade (G): G3 ?Histologic grading system: 3 grade system ?Histologic sub-type: Adenocarcinoma ? ?  ?09/28/2021 -  Chemotherapy  ? Patient is on Treatment Plan : BREAST Capecitabine q21d  ?   ? ? ?CURRENT THERAPY: Adjuvant Xeloda 1500 mg AM/2000 mg PM days 1-14 q21 days, x6 months, starting 10/02/21.  Reduced to 1500 mg twice daily on 12/08/2021 due to abdominal pain ?  ? ?INTERVAL HISTORY: [name] returns for follow up and treatment as scheduled. Last seen by me 01/21/22. She presents today prior to next cycle Xeloda which was started 3/29.  Her hands are darker, but not more painful or sensitive.  She only has dryness and cracks in her thumbs.  She can function well.  Her toes feel swollen and sensitive especially at the right big toe.  Feet are also dark, and dry on the left.  She continues to use aloe based cream, no urea or hydrocortisone yet.  She feels a little better on her week off.  She continues to have intermittent abdominal discomfort.  She is eating and drinking well, bowels moving.  Denies nausea/vomiting. She had a scratchy throat and spit up blood tinged saliva recently, but that resolved. She does note occasional gum bleeding when she brushes.  ? ? Denies fever, chills, cough, chest pain, dyspnea, rash, mucositis, or any other new complaints.  She passed the test to become a [place] citizen on 3/23, she is very happy. ? ? ?MEDICAL HISTORY:  ?Past Medical History:  ?Diagnosis Date  ? Cancer [facility])   ? Hypertension   ? ? ?SURGICAL HISTORY: ?Past Surgical History:  ?Procedure Laterality Date  ? CHOLECYSTECTOMY  07/23/2021  ? INTRAOPERATIVE CHOLANGIOGRAM N/A 09/04/2021  ? Procedure: INTRAOPERATIVE CHOLANGIOGRAM;  Surgeon: Dwan Bolt, MD;  Location: [place];  Service: General;  Laterality: N/A;  ? LAPAROSCOPY N/A 09/04/2021  ? Procedure: STAGING LAPAROSCOPY;  Surgeon: Dwan Bolt, MD;  Location: [place];  Service:  General;  Laterality: N/A;  ? LYMPH NODE DISSECTION N/A 09/04/2021  ? Procedure: PORTAL LYMPH NODE DISSECTION;  Surgeon: Dwan Bolt, MD;  Location: [place];  Service: General;  Laterality: N/A;  ? OPEN HEPATECTOMY  N/A 09/04/2021  ? Procedure: OPEN PARTIAL CENTRAL HEPATECTOMY;  Surgeon: Dwan Bolt, MD;  Location: [place];  Service: General;  Laterality: N/A;  ? ? ?I have reviewed the social history and family history with the patient and they are unchanged from previous note. ? ?ALLERGIES:  has No Known Allergies. ? ?MEDICATIONS:  ?Current Outpatient Medications  ?Medication Sig Dispense Refill  ? acetaminophen (TYLENOL) 325 MG tablet Take 2 tablets (650 mg total) by mouth every 6 (six) hours as needed for mild pain or fever.    ? capecitabine (XELODA) 500 MG tablet Take 3 tabs every 12 hours on day 1-14 every 21 days. 84 tablet 1  ? hydrochlorothiazide (MICROZIDE) 12.5 MG capsule Take 12.5 mg by mouth daily.    ? lisinopril (ZESTRIL) 10 MG tablet Take 1 tablet (10 mg total) by mouth daily. 30 tablet 0  ? prochlorperazine (COMPAZINE) 10 MG tablet Take 10 mg by mouth every 6 (six) hours as needed.    ? urea (CARMOL) 10 % cream Apply topically to hands and feet 2 to 3 times daily as needed 71 g 2  ? ?No current facility-administered medications for this visit.  ? ? ?  PHYSICAL EXAMINATION: ?ECOG PERFORMANCE STATUS: 1 - Symptomatic but completely ambulatory ? ?Vitals:  ? 02/08/22 1016  ?BP: 139/78  ?Pulse: 79  ?Resp: 17  ?Temp: 98.5 ?F (36.9 ?C)  ?SpO2: 98%  ? ?Filed Weights  ? 02/08/22 1016  ?Weight: 160 lb 6.4 oz (72.8 kg)  ? ? ?GENERAL:alert, no distress and comfortable ?SKIN: Palms with hyperpigmentation, mild erythema, scattered dryness on the digits.  Small crack on the right thumb.  Mild dry skin with peeling on the left heel.  Erythema and tenderness with soft tissue fullness at the right big toe ?EYES: sclera clear ?OROPHARYNX: No thrush or ulcers ?LUNGS:  normal breathing effort ?HEART:  no lower  extremity edema ?ABDOMEN:abdomen soft, non-tender and normal bowel sounds ?NEURO: alert & oriented x 3 with fluent speech, no focal motor/sensory deficits ? ?LABORATORY DATA:  ?I have reviewed the data as listed ? ?

## 2022-02-08 ENCOUNTER — Inpatient Hospital Stay: Payer: 59

## 2022-02-08 ENCOUNTER — Encounter: Payer: Self-pay | Admitting: Hematology

## 2022-02-08 ENCOUNTER — Inpatient Hospital Stay (HOSPITAL_BASED_OUTPATIENT_CLINIC_OR_DEPARTMENT_OTHER): Payer: 59 | Admitting: Nurse Practitioner

## 2022-02-08 ENCOUNTER — Encounter: Payer: Self-pay | Admitting: Nurse Practitioner

## 2022-02-08 VITALS — BP 139/78 | HR 79 | Temp 98.5°F | Resp 17 | Ht 64.0 in | Wt 160.4 lb

## 2022-02-08 DIAGNOSIS — C23 Malignant neoplasm of gallbladder: Secondary | ICD-10-CM | POA: Diagnosis not present

## 2022-02-08 LAB — CBC WITH DIFFERENTIAL (CANCER CENTER ONLY)
Abs Immature Granulocytes: 0.01 10*3/uL (ref 0.00–0.07)
Basophils Absolute: 0 10*3/uL (ref 0.0–0.1)
Basophils Relative: 1 %
Eosinophils Absolute: 0.3 10*3/uL (ref 0.0–0.5)
Eosinophils Relative: 5 %
HCT: 38.6 % (ref 36.0–46.0)
Hemoglobin: 13.4 g/dL (ref 12.0–15.0)
Immature Granulocytes: 0 %
Lymphocytes Relative: 38 %
Lymphs Abs: 2.1 10*3/uL (ref 0.7–4.0)
MCH: 35.9 pg — ABNORMAL HIGH (ref 26.0–34.0)
MCHC: 34.7 g/dL (ref 30.0–36.0)
MCV: 103.5 fL — ABNORMAL HIGH (ref 80.0–100.0)
Monocytes Absolute: 0.6 10*3/uL (ref 0.1–1.0)
Monocytes Relative: 11 %
Neutro Abs: 2.5 10*3/uL (ref 1.7–7.7)
Neutrophils Relative %: 45 %
Platelet Count: 334 10*3/uL (ref 150–400)
RBC: 3.73 MIL/uL — ABNORMAL LOW (ref 3.87–5.11)
RDW: 16.8 % — ABNORMAL HIGH (ref 11.5–15.5)
WBC Count: 5.6 10*3/uL (ref 4.0–10.5)
nRBC: 0 % (ref 0.0–0.2)

## 2022-02-08 LAB — CMP (CANCER CENTER ONLY)
ALT: 20 U/L (ref 0–44)
AST: 26 U/L (ref 15–41)
Albumin: 4.3 g/dL (ref 3.5–5.0)
Alkaline Phosphatase: 93 U/L (ref 38–126)
Anion gap: 4 — ABNORMAL LOW (ref 5–15)
BUN: 12 mg/dL (ref 6–20)
CO2: 26 mmol/L (ref 22–32)
Calcium: 9.5 mg/dL (ref 8.9–10.3)
Chloride: 109 mmol/L (ref 98–111)
Creatinine: 0.73 mg/dL (ref 0.44–1.00)
GFR, Estimated: >60 mL/min (ref >60)
Glucose, Bld: 91 mg/dL (ref 70–99)
Potassium: 4.5 mmol/L (ref 3.5–5.1)
Sodium: 139 mmol/L (ref 135–145)
Total Bilirubin: 1.5 mg/dL — ABNORMAL HIGH (ref 0.3–1.2)
Total Protein: 7.6 g/dL (ref 6.5–8.1)

## 2022-03-01 ENCOUNTER — Inpatient Hospital Stay: Payer: 59 | Attending: Nurse Practitioner

## 2022-03-01 ENCOUNTER — Inpatient Hospital Stay: Payer: 59 | Admitting: Hematology

## 2022-03-01 ENCOUNTER — Other Ambulatory Visit (HOSPITAL_COMMUNITY): Payer: Self-pay

## 2022-03-01 DIAGNOSIS — Z79899 Other long term (current) drug therapy: Secondary | ICD-10-CM | POA: Insufficient documentation

## 2022-03-01 DIAGNOSIS — C23 Malignant neoplasm of gallbladder: Secondary | ICD-10-CM

## 2022-03-01 DIAGNOSIS — I10 Essential (primary) hypertension: Secondary | ICD-10-CM | POA: Insufficient documentation

## 2022-03-02 ENCOUNTER — Encounter: Payer: Self-pay | Admitting: Hematology

## 2022-03-03 ENCOUNTER — Other Ambulatory Visit (HOSPITAL_COMMUNITY): Payer: Self-pay

## 2022-03-03 ENCOUNTER — Encounter: Payer: Self-pay | Admitting: Hematology

## 2022-03-10 ENCOUNTER — Ambulatory Visit: Payer: 59 | Admitting: Podiatry

## 2022-03-11 ENCOUNTER — Inpatient Hospital Stay (HOSPITAL_BASED_OUTPATIENT_CLINIC_OR_DEPARTMENT_OTHER): Payer: 59 | Admitting: Hematology

## 2022-03-11 ENCOUNTER — Other Ambulatory Visit: Payer: Self-pay

## 2022-03-11 ENCOUNTER — Inpatient Hospital Stay: Payer: 59

## 2022-03-11 VITALS — BP 140/71 | HR 80 | Resp 18 | Wt 163.5 lb

## 2022-03-11 DIAGNOSIS — C23 Malignant neoplasm of gallbladder: Secondary | ICD-10-CM

## 2022-03-11 DIAGNOSIS — I10 Essential (primary) hypertension: Secondary | ICD-10-CM | POA: Diagnosis not present

## 2022-03-11 DIAGNOSIS — Z79899 Other long term (current) drug therapy: Secondary | ICD-10-CM | POA: Diagnosis not present

## 2022-03-11 LAB — CBC WITH DIFFERENTIAL (CANCER CENTER ONLY)
Abs Immature Granulocytes: 0.01 10*3/uL (ref 0.00–0.07)
Basophils Absolute: 0 10*3/uL (ref 0.0–0.1)
Basophils Relative: 1 %
Eosinophils Absolute: 0.2 10*3/uL (ref 0.0–0.5)
Eosinophils Relative: 3 %
HCT: 38.5 % (ref 36.0–46.0)
Hemoglobin: 13.4 g/dL (ref 12.0–15.0)
Immature Granulocytes: 0 %
Lymphocytes Relative: 31 %
Lymphs Abs: 1.7 10*3/uL (ref 0.7–4.0)
MCH: 35.9 pg — ABNORMAL HIGH (ref 26.0–34.0)
MCHC: 34.8 g/dL (ref 30.0–36.0)
MCV: 103.2 fL — ABNORMAL HIGH (ref 80.0–100.0)
Monocytes Absolute: 0.5 10*3/uL (ref 0.1–1.0)
Monocytes Relative: 9 %
Neutro Abs: 3.1 10*3/uL (ref 1.7–7.7)
Neutrophils Relative %: 56 %
Platelet Count: 332 10*3/uL (ref 150–400)
RBC: 3.73 MIL/uL — ABNORMAL LOW (ref 3.87–5.11)
RDW: 15.9 % — ABNORMAL HIGH (ref 11.5–15.5)
WBC Count: 5.5 10*3/uL (ref 4.0–10.5)
nRBC: 0 % (ref 0.0–0.2)

## 2022-03-11 LAB — CMP (CANCER CENTER ONLY)
ALT: 18 U/L (ref 0–44)
AST: 23 U/L (ref 15–41)
Albumin: 4.2 g/dL (ref 3.5–5.0)
Alkaline Phosphatase: 84 U/L (ref 38–126)
Anion gap: 5 (ref 5–15)
BUN: 11 mg/dL (ref 6–20)
CO2: 30 mmol/L (ref 22–32)
Calcium: 9.4 mg/dL (ref 8.9–10.3)
Chloride: 105 mmol/L (ref 98–111)
Creatinine: 0.7 mg/dL (ref 0.44–1.00)
GFR, Estimated: >60 mL/min (ref >60)
Glucose, Bld: 107 mg/dL — ABNORMAL HIGH (ref 70–99)
Potassium: 3.9 mmol/L (ref 3.5–5.1)
Sodium: 140 mmol/L (ref 135–145)
Total Bilirubin: 2.3 mg/dL — ABNORMAL HIGH (ref 0.3–1.2)
Total Protein: 7.3 g/dL (ref 6.5–8.1)

## 2022-03-11 NOTE — Progress Notes (Signed)
?[place]   ?[phone] [phone]   ?Clinic Follow up Note  ? ?Patient Care Team: ?Geradine Girt, DO as PCP - General (Internal Medicine) ?Dwan Bolt, MD as Consulting Physician (General Surgery) ?Truitt Merle, MD as Consulting Physician (Hematology) ? ?Date of Service:  03/11/2022 ? ?CHIEF COMPLAINT: f/u of gallbladder cancer ? ?CURRENT THERAPY:  ?Adjuvant Xeloda, starting 10/02/21, plan for 6 months ? -current dose: 1500 mg BID since 12/08/21 ? ?ASSESSMENT & PLAN:  ?Michele Arnold is a 61 y.o. female with  ? ?1. 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  Work-up consistent with acute cholecystitis. On cholecystectomy by Dr. Hassell Done 07/23/21, she had incidental finding of poorly differentiated adenocarcinoma in the gallbladder neck, invading perimuscular soft tissue with a positive cystic duct margin.  ?-Staging work-up was negative for metastatic disease, baseline CA 19-9 was normal ?-She subsequently underwent oncologic staging surgery, resection of positive margin, and portal lymph node dissection Dr. Zenia Resides on 09/04/21, there is no residual carcinoma.  Overall pT2a N0 M0  ?-Began adjuvant Xeloda 1500 mg a.m./2000 mg p.m. for 2 weeks on/1 week off, plan for 6 months, starting 10/02/21. Held in 11/2021 for hyperbilirubinemia and abdominal pain (MRI was negative). ?-she continues Xeloda at 1500 mg twice daily on day 1-14 every 21 days. She is currently in week 2 of cycle 8. She tolerated very well overall. This will be her last cycle. We will plan for restaging CT in 3 months. ?  ?2. Hypertension ?-On lisinopril and HCTZ, prescribed by Eulogio Bear, DO ?  ?3. Health maintenance, disease prevention ?-I encouraged her to stay up-to-date on age-appropriate cancer screenings, vaccines, and live healthy active lifestyle ?-she is scheduled for  mammogram on 03/18/22 ?  ?4. Social ?-She began a new job 1 week prior to her illness, she states she still has a job but not sure what her role will be when she returns ?-Lives with spouse, has 2 daughters. She has good family support ?-Followed by social work/financial for co-pay assistance with Xeloda ?-She officially became a [place] citizen on 02/04/22 ?  ?  ?Plan: ?-continue Xeloda to complete this cycle, then stop ?-f/u in 3 months, with lab and CT several days before ? ? ?No problem-specific Assessment & Plan notes found for this encounter. ? ? ?SUMMARY OF ONCOLOGIC HISTORY: ?Oncology History  ?Adenocarcinoma of gallbladder ([place])  ?07/21/2021 Imaging  ? CT AP IMPRESSION: ?1. Cholelithiasis with distended gallbladder. No CT evidence of acute cholecystitis. Consider further evaluation with right upper quadrant ultrasound as clinically indicated. ?2. Punctate nonobstructive left nephrolithiasis. ?3. Aortic Atherosclerosis (ICD10-I70.0). ?  ?07/21/2021 Imaging  ? ABD [place] RUQ IMPRESSION: ?1. Cholelithiasis without evidence of acute cholecystitis. ?2. Mild hepatic steatosis. ?  ?  ?07/21/2021 Imaging  ? MRA/MRCP IMPRESSION: ?1. No biliary ductal dilation. No choledocholithiasis. ?2. Mild hepatic steatosis. ?3. Cholelithiasis with dilation of the gallbladder and trace pericholecystic fluid. No gallbladder wall thickening or abnormal wall enhancement. Findings which are equivocal for acute cholecystitis. Consider further evaluation with nuclear medicine HIDA scan to assess cystic duct patency if clinically indicated. ?  ?07/22/2021 Imaging  ? HIDA SCAN IMPRESSION: ?Scintigraphic findings most consistent with acute cholecystitis. ?  ?07/23/2021 Surgery  ? Preop Dx:        Acute cholecystitis ?Postop Dx:      Acute cholecystitis with 1 cm stone impacted in the infundibulum ?Procedure:  Xi robotic cholecystectomy with ICG ?By Dr. Johnathan Hausen ?  ?07/23/2021 Pathology Results  ? FINAL MICROSCOPIC DIAGNOSIS:  ?A. GALLBLADDER,  CHOLECYSTECTOMY:  ?- Invasive poorly differentiated adenocarcinoma, 4.3 cm, involving  ?gallbladder neck  ?- Carcinoma invades perimuscular soft tissue  ?- Cystic duct margin is focally positive for carcinoma  ?- Focally suspicious for lymphovascular invasion  ? ?Procedure: Cholecystectomy  ?Tumor Site: Gallbladder neck  ?Tumor Size: 4.3 cm  ?Histologic Type: Adenocarcinoma  ?Histologic Grade: G3: Poorly differentiated  ?Tumor Extension: Tumor invades perimuscular connective tissue on the peritoneal side without serosal involvement  ?Margins:  ?     Margin Status for Invasive Carcinoma: Cystic duct margin is positive for carcinoma  ?Regional Lymph Nodes: Not applicable (no lymph nodes submitted or found)  ?Distant Metastasis:  ?     Distant Site(s) Involved: Not applicable  ?Pathologic Stage Classification (pTNM, AJCC 8th Edition): pT2a, pN not assigned  ?  ?08/20/2021 Imaging  ? CT chest IMPRESSION: ?Negative. No CT evidence for acute intrathoracic abnormality. Negative for pulmonary nodule or evidence for metastatic disease to the chest. ?  ?09/01/2021 Tumor Marker  ? CA 19-9: 8 (normal) ?  ?09/04/2021 Initial Diagnosis  ? Adenocarcinoma of gallbladder [facility]) ?  ?09/04/2021 Definitive Surgery  ? Procedure: by Dr. Michaelle Birks ?Staging laparoscopy ?Exploratory laparotomy with intraoperative cholangiogram ?Excision of new cystic duct margin ?Partial central hepatectomy (resection of gallbladder fossa - segments 4b and 5) ?Portal lymph node dissection ?  ?09/04/2021 Pathology Results  ? FINAL MICROSCOPIC DIAGNOSIS:  ?A. CYSTIC DUCT, NEW MARGIN, EXCISION:  ?-  No adenocarcinoma identified  ?B. LYMPH NODE, PORTAL, EXCISION:  ?-  Nodular fat necrosis and fibrosis  ?-  No nodal tissue identified  ?C. GALLBLADDER, FOSSA, EXCISION:  ?-  Benign liver with periductular chronic inflammation and  ?macrovesicular steatosis  ?-  No adenocarcinoma identified  ?D. LYMPH NODE, STATION EIGHT, EXCISION:  ?-  No adenocarcinoma  identified in one lymph node (0/1)  ?  ?09/04/2021 Cancer Staging  ? Staging form: Gallbladder, AJCC 8th Edition ?- Clinical stage from 09/04/2021: Stage IIA (cT2a, cN0, cM0) - Signed by Alla Feeling, NP on 09/21/2021 ?Stage prefix: Initial diagnosis ?Total positive nodes: 0 ?Histologic grade (G): G3 ?Histologic grading system: 3 grade system ?Histologic sub-type: Adenocarcinoma ? ?  ?09/28/2021 -  Chemotherapy  ? Patient is on Treatment Plan : BREAST Capecitabine q21d  ? ?  ?  ? ? ? ?INTERVAL HISTORY:  ?Jacari Kirsten is here for a follow up of gallbladder cancer. She was last seen by NP Lacie on 02/08/22. She presents to the clinic accompanied by interpreter Almyra Free. ?She reports she is doing well overall. She notes she was started on antibiotics for conjunctivitis. ?  ?All other systems were reviewed with the patient and are negative. ? ?MEDICAL HISTORY:  ?Past Medical History:  ?Diagnosis Date  ? Cancer [facility])   ? Hypertension   ? ? ?SURGICAL HISTORY: ?Past Surgical History:  ?Procedure Laterality Date  ? CHOLECYSTECTOMY  07/23/2021  ? INTRAOPERATIVE CHOLANGIOGRAM N/A 09/04/2021  ? Procedure: INTRAOPERATIVE CHOLANGIOGRAM;  Surgeon: Dwan Bolt, MD;  Location: [place];  Service: General;  Laterality: N/A;  ? LAPAROSCOPY N/A 09/04/2021  ? Procedure: STAGING LAPAROSCOPY;  Surgeon: Dwan Bolt, MD;  Location: [place];  Service: General;  Laterality: N/A;  ? LYMPH NODE DISSECTION N/A 09/04/2021  ? Procedure: PORTAL LYMPH NODE DISSECTION;  Surgeon: Dwan Bolt, MD;  Location: [place];  Service: General;  Laterality: N/A;  ?  OPEN HEPATECTOMY  N/A 09/04/2021  ? Procedure: OPEN PARTIAL CENTRAL HEPATECTOMY;  Surgeon: Dwan Bolt, MD;  Location: [place];  Service: General;  Laterality: N/A;  ? ? ?I have reviewed the social history and family history with the patient and they are unchanged from previous note. ? ?ALLERGIES:  has No Known Allergies. ? ?MEDICATIONS:  ?Current Outpatient Medications  ?Medication Sig  Dispense Refill  ? acetaminophen (TYLENOL) 325 MG tablet Take 2 tablets (650 mg total) by mouth every 6 (six) hours as needed for mild pain or fever.    ? capecitabine (XELODA) 500 MG tablet Take 3 tabs every 12 hours on day 1-14

## 2022-03-18 ENCOUNTER — Ambulatory Visit: Payer: Self-pay

## 2022-03-18 ENCOUNTER — Telehealth: Payer: Self-pay | Admitting: Hematology

## 2022-03-18 ENCOUNTER — Ambulatory Visit: Payer: No Typology Code available for payment source

## 2022-03-18 NOTE — Telephone Encounter (Signed)
Scheduled follow-up appointments per 4/27 los. Patient is aware. 

## 2022-03-30 ENCOUNTER — Other Ambulatory Visit (HOSPITAL_COMMUNITY): Payer: Self-pay

## 2022-06-07 ENCOUNTER — Encounter (HOSPITAL_COMMUNITY): Payer: Self-pay

## 2022-06-07 ENCOUNTER — Ambulatory Visit (HOSPITAL_COMMUNITY)
Admission: RE | Admit: 2022-06-07 | Discharge: 2022-06-07 | Disposition: A | Discharge status: Home / Self Care | Payer: 59 | Source: Ambulatory Visit | Attending: Hematology | Admitting: Hematology

## 2022-06-07 ENCOUNTER — Other Ambulatory Visit: Payer: Self-pay

## 2022-06-07 ENCOUNTER — Inpatient Hospital Stay: Payer: 59 | Attending: Hematology

## 2022-06-07 DIAGNOSIS — I10 Essential (primary) hypertension: Secondary | ICD-10-CM | POA: Insufficient documentation

## 2022-06-07 DIAGNOSIS — C23 Malignant neoplasm of gallbladder: Secondary | ICD-10-CM | POA: Insufficient documentation

## 2022-06-07 DIAGNOSIS — Z79899 Other long term (current) drug therapy: Secondary | ICD-10-CM | POA: Insufficient documentation

## 2022-06-07 LAB — CMP (CANCER CENTER ONLY)
ALT: 25 U/L (ref 0–44)
AST: 26 U/L (ref 15–41)
Albumin: 4.2 g/dL (ref 3.5–5.0)
Alkaline Phosphatase: 84 U/L (ref 38–126)
Anion gap: 5 (ref 5–15)
BUN: 17 mg/dL (ref 6–20)
CO2: 30 mmol/L (ref 22–32)
Calcium: 9.2 mg/dL (ref 8.9–10.3)
Chloride: 107 mmol/L (ref 98–111)
Creatinine: 0.78 mg/dL (ref 0.44–1.00)
GFR, Estimated: >60 mL/min (ref >60)
Glucose, Bld: 74 mg/dL (ref 70–99)
Potassium: 4 mmol/L (ref 3.5–5.1)
Sodium: 142 mmol/L (ref 135–145)
Total Bilirubin: 1.3 mg/dL — ABNORMAL HIGH (ref 0.3–1.2)
Total Protein: 7.5 g/dL (ref 6.5–8.1)

## 2022-06-07 LAB — CBC WITH DIFFERENTIAL (CANCER CENTER ONLY)
Abs Immature Granulocytes: 0.01 10*3/uL (ref 0.00–0.07)
Basophils Absolute: 0 10*3/uL (ref 0.0–0.1)
Basophils Relative: 0 %
Eosinophils Absolute: 0.3 10*3/uL (ref 0.0–0.5)
Eosinophils Relative: 4 %
HCT: 39.1 % (ref 36.0–46.0)
Hemoglobin: 13.3 g/dL (ref 12.0–15.0)
Immature Granulocytes: 0 %
Lymphocytes Relative: 30 %
Lymphs Abs: 2.1 10*3/uL (ref 0.7–4.0)
MCH: 33.5 pg (ref 26.0–34.0)
MCHC: 34 g/dL (ref 30.0–36.0)
MCV: 98.5 fL (ref 80.0–100.0)
Monocytes Absolute: 0.7 10*3/uL (ref 0.1–1.0)
Monocytes Relative: 10 %
Neutro Abs: 3.9 10*3/uL (ref 1.7–7.7)
Neutrophils Relative %: 56 %
Platelet Count: 315 10*3/uL (ref 150–400)
RBC: 3.97 MIL/uL (ref 3.87–5.11)
RDW: 12.4 % (ref 11.5–15.5)
WBC Count: 7.1 10*3/uL (ref 4.0–10.5)
nRBC: 0 % (ref 0.0–0.2)

## 2022-06-07 MED ORDER — IOHEXOL 300 MG/ML  SOLN
100.0000 mL | Freq: Once | INTRAMUSCULAR | Status: AC | PRN
  Administered 2022-06-07: 100 mL via INTRAVENOUS

## 2022-06-10 ENCOUNTER — Inpatient Hospital Stay: Payer: 59 | Admitting: Hematology

## 2022-06-10 ENCOUNTER — Other Ambulatory Visit: Payer: Self-pay

## 2022-06-10 ENCOUNTER — Encounter: Payer: Self-pay | Admitting: Hematology

## 2022-06-10 VITALS — BP 124/63 | HR 73 | Temp 97.7°F | Resp 18 | Ht 64.0 in | Wt 164.9 lb

## 2022-06-10 DIAGNOSIS — C23 Malignant neoplasm of gallbladder: Secondary | ICD-10-CM

## 2022-06-10 DIAGNOSIS — Z79899 Other long term (current) drug therapy: Secondary | ICD-10-CM | POA: Diagnosis not present

## 2022-06-10 DIAGNOSIS — I10 Essential (primary) hypertension: Secondary | ICD-10-CM | POA: Diagnosis not present

## 2022-06-10 NOTE — Progress Notes (Signed)
[place]   Telephone:(336) [phone] Fax:(336) [phone]   Clinic Follow up Note   Patient Care Team: Geradine Girt, DO as PCP - General (Internal Medicine) Dwan Bolt, MD as Consulting Physician (General Surgery) Truitt Merle, MD as Consulting Physician (Hematology)  Date of Service:  06/10/2022  CHIEF COMPLAINT: f/u of gallbladder cancer  CURRENT THERAPY:  Surveillance  ASSESSMENT & PLAN:  Michele Arnold is a 61 y.o. female with   1. 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  Work-up consistent with acute cholecystitis. On cholecystectomy by Dr. Hassell Done 07/23/21, she had incidental finding of poorly differentiated adenocarcinoma in the gallbladder neck, invading perimuscular soft tissue with a positive cystic duct margin.  -Staging work-up was negative for metastatic disease, baseline CA 19-9 was normal -s/p oncologic staging surgery, resection of positive margin, and portal lymph node dissection Dr. Zenia Resides on 09/04/21. Path showed no residual carcinoma, staged as pT2a N0 -completed adjuvant Xeloda 10/02/21 - 03/15/22. Held in 11/2021 for hyperbilirubinemia and abdominal pain (MRI was negative), tolerated better with reduced dose. -posttreatment CT AP on 06/07/22 showed a few soft tissue foci along the inferior resection cavity, nonspecific but possibly reflecting surgical change, close follow-up was recommended.  I reviewed the results with her today.  I recommend repeating CT scan in 3 months. -she is clinically doing very well, improving off treatment. Labs from 7/24 reviewed, overall WNL. There is no concern for recurrence.   2. Hypertension -On lisinopril and HCTZ, prescribed by Eulogio Bear, DO   3. Health maintenance, disease prevention -I encouraged her to stay up-to-date on age-appropriate cancer screenings, vaccines, and live  healthy active lifestyle -she is due for mammogram   4. Social -Lives with spouse, has 2 daughters. She has good family support -Followed by social work/financial for co-pay assistance with Xeloda -She officially became a [place] citizen on 02/04/22     Plan: -f/u in 3 months, with lab and CT AP several days before   No problem-specific Assessment & Plan notes found for this encounter.   SUMMARY OF ONCOLOGIC HISTORY: Oncology History  Adenocarcinoma of gallbladder ([place])  07/21/2021 Imaging   CT AP IMPRESSION: 1. Cholelithiasis with distended gallbladder. No CT evidence of acute cholecystitis. Consider further evaluation with right upper quadrant ultrasound as clinically indicated. 2. Punctate nonobstructive left nephrolithiasis. 3. Aortic Atherosclerosis (ICD10-I70.0).   07/21/2021 Imaging   ABD [place] RUQ IMPRESSION: 1. Cholelithiasis without evidence of acute cholecystitis. 2. Mild hepatic steatosis.     07/21/2021 Imaging   MRA/MRCP IMPRESSION: 1. No biliary ductal dilation. No choledocholithiasis. 2. Mild hepatic steatosis. 3. Cholelithiasis with dilation of the gallbladder and trace pericholecystic fluid. No gallbladder wall thickening or abnormal wall enhancement. Findings which are equivocal for acute cholecystitis. Consider further evaluation with nuclear medicine HIDA scan to assess cystic duct patency if clinically indicated.   07/22/2021 Imaging   HIDA SCAN IMPRESSION: Scintigraphic findings most consistent with acute cholecystitis.   07/23/2021 Surgery   Preop Dx:        Acute cholecystitis Postop Dx:      Acute cholecystitis with 1 cm stone impacted in the infundibulum Procedure:      Xi robotic cholecystectomy with ICG By Dr. Johnathan Hausen   07/23/2021 Pathology Results   FINAL MICROSCOPIC DIAGNOSIS:  A. GALLBLADDER, CHOLECYSTECTOMY:  - Invasive poorly differentiated adenocarcinoma, 4.3 cm, involving  gallbladder neck  - Carcinoma invades perimuscular soft tissue  -  Cystic duct margin is focally positive for carcinoma  - Focally suspicious for lymphovascular invasion   Procedure: Cholecystectomy  Tumor Site: Gallbladder neck  Tumor Size: 4.3 cm  Histologic Type: Adenocarcinoma  Histologic Grade: G3: Poorly differentiated  Tumor Extension: Tumor invades perimuscular connective tissue on the peritoneal side without serosal involvement  Margins:       Margin Status for Invasive Carcinoma: Cystic duct margin is positive for carcinoma  Regional Lymph Nodes: Not applicable (no lymph nodes submitted or found)  Distant Metastasis:       Distant Site(s) Involved: Not applicable  Pathologic Stage Classification (pTNM, AJCC 8th Edition): pT2a, pN not assigned    08/20/2021 Imaging   CT chest IMPRESSION: Negative. No CT evidence for acute intrathoracic abnormality. Negative for pulmonary nodule or evidence for metastatic disease to the chest.   09/01/2021 Tumor Marker   CA 19-9: 8 (normal)   09/04/2021 Initial Diagnosis   Adenocarcinoma of gallbladder ([place])   09/04/2021 Definitive Surgery   Procedure: by Dr. Michaelle Birks Staging laparoscopy Exploratory laparotomy with intraoperative cholangiogram Excision of new cystic duct margin Partial central hepatectomy (resection of gallbladder fossa - segments 4b and 5) Portal lymph node dissection   09/04/2021 Pathology Results   FINAL MICROSCOPIC DIAGNOSIS:  A. CYSTIC DUCT, NEW MARGIN, EXCISION:  -  No adenocarcinoma identified  B. LYMPH NODE, PORTAL, EXCISION:  -  Nodular fat necrosis and fibrosis  -  No nodal tissue identified  C. GALLBLADDER, FOSSA, EXCISION:  -  Benign liver with periductular chronic inflammation and  macrovesicular steatosis  -  No adenocarcinoma identified  D. LYMPH NODE, STATION EIGHT, EXCISION:  -  No adenocarcinoma identified in one lymph node (0/1)    09/04/2021 Cancer Staging   Staging form: Gallbladder, AJCC 8th Edition - Clinical stage from 09/04/2021: Stage IIA (cT2a,  cN0, cM0) - Signed by Alla Feeling, NP on 09/21/2021 Stage prefix: Initial diagnosis Total positive nodes: 0 Histologic grade (G): G3 Histologic grading system: 3 grade system Histologic sub-type: Adenocarcinoma   09/28/2021 -  Chemotherapy   Patient is on Treatment Plan : BREAST Capecitabine q21d        INTERVAL HISTORY:  Michele Arnold is here for a follow up of gallbladder cancer. She was last seen by me on 03/11/22. She presents to the clinic accompanied by an interpreter She reports he is doing well. She tells me the pain in her hands and feet has resolved off Xeloda. She notes she recently got her [place] passport and is excitedly thinking about future travel plans.   All other systems were reviewed with the patient and are negative.  MEDICAL HISTORY:  Past Medical History:  Diagnosis Date   Cancer [facility])    Hypertension     SURGICAL HISTORY: Past Surgical History:  Procedure Laterality Date   CHOLECYSTECTOMY  07/23/2021   INTRAOPERATIVE CHOLANGIOGRAM N/A 09/04/2021   Procedure: INTRAOPERATIVE CHOLANGIOGRAM;  Surgeon: Dwan Bolt, MD;  Location: [place];  Service: General;  Laterality: N/A;   LAPAROSCOPY N/A 09/04/2021   Procedure: STAGING LAPAROSCOPY;  Surgeon: Dwan Bolt, MD;  Location: [place];  Service: General;  Laterality: N/A;   LYMPH NODE DISSECTION N/A 09/04/2021   Procedure: PORTAL LYMPH NODE DISSECTION;  Surgeon: Dwan Bolt, MD;  Location: [place];  Service: General;  Laterality: N/A;   OPEN HEPATECTOMY  N/A 09/04/2021   Procedure: OPEN PARTIAL CENTRAL HEPATECTOMY;  Surgeon: Dwan Bolt, MD;  Location: [place];  Service: General;  Laterality: N/A;  I have reviewed the social history and family history with the patient and they are unchanged from previous note.  ALLERGIES:  has No Known Allergies.  MEDICATIONS:  Current Outpatient Medications  Medication Sig Dispense Refill   acetaminophen (TYLENOL) 325 MG tablet Take 2 tablets (650 mg total) by  mouth every 6 (six) hours as needed for mild pain or fever.     hydrochlorothiazide (MICROZIDE) 12.5 MG capsule Take 12.5 mg by mouth daily.     lisinopril (ZESTRIL) 10 MG tablet Take 1 tablet (10 mg total) by mouth daily. 30 tablet 0   prochlorperazine (COMPAZINE) 10 MG tablet Take 10 mg by mouth every 6 (six) hours as needed.     urea (CARMOL) 10 % cream Apply topically to hands and feet 2 to 3 times daily as needed 71 g 2   No current facility-administered medications for this visit.    PHYSICAL EXAMINATION: ECOG PERFORMANCE STATUS: 0 - Asymptomatic  Vitals:   06/10/22 0942  BP: 124/63  Pulse: 73  Resp: 18  Temp: 97.7 F (36.5 C)  SpO2: 97%   Wt Readings from Last 3 Encounters:  06/10/22 164 lb 14.4 oz (74.8 kg)  03/11/22 163 lb 8 oz (74.2 kg)  02/08/22 160 lb 6.4 oz (72.8 kg)     GENERAL:alert, no distress and comfortable SKIN: skin color normal, no rashes or significant lesions EYES: normal, Conjunctiva are pink and non-injected, sclera clear  NEURO: alert & oriented x 3 with fluent speech  LABORATORY DATA:  I have reviewed the data as listed    Latest Ref Rng & Units 06/07/2022    2:59 PM 03/11/2022   10:29 AM 02/08/2022    9:58 AM  CBC  WBC 4.0 - 10.5 K/uL 7.1  5.5  5.6   Hemoglobin 12.0 - 15.0 g/dL 13.3  13.4  13.4   Hematocrit 36.0 - 46.0 % 39.1  38.5  38.6   Platelets 150 - 400 K/uL 315  332  334         Latest Ref Rng & Units 06/07/2022    2:59 PM 03/11/2022   10:29 AM 02/08/2022    9:58 AM  CMP  Glucose 70 - 99 mg/dL 74  107  91   BUN 6 - 20 mg/dL '17  11  12   '$ Creatinine 0.44 - 1.00 mg/dL 0.78  0.70  0.73   Sodium 135 - 145 mmol/L 142  140  139   Potassium 3.5 - 5.1 mmol/L 4.0  3.9  4.5   Chloride 98 - 111 mmol/L 107  105  109   CO2 22 - 32 mmol/L '30  30  26   '$ Calcium 8.9 - 10.3 mg/dL 9.2  9.4  9.5   Total Protein 6.5 - 8.1 g/dL 7.5  7.3  7.6   Total Bilirubin 0.3 - 1.2 mg/dL 1.3  2.3  1.5   Alkaline Phos 38 - 126 U/L 84  84  93   AST 15 - 41 U/L  '26  23  26   '$ ALT 0 - 44 U/L '25  18  20       '$ RADIOGRAPHIC STUDIES: I have personally reviewed the radiological images as listed and agreed with the findings in the report. No results found.    Orders Placed This Encounter  Procedures   CT ABDOMEN PELVIS W CONTRAST    Standing Status:   Future    Standing Expiration Date:   06/11/2023    Order Specific Question:  If indicated for the ordered procedure, I authorize the administration of contrast media per Radiology protocol    Answer:   Yes    Order Specific Question:   Preferred imaging location?    Answer:   [facility]    Order Specific Question:   Is Oral Contrast requested for this exam?    Answer:   Yes, Per Radiology protocol    Order Specific Question:   Is patient pregnant?    Answer:   No   All questions were answered. The patient knows to call the clinic with any problems, questions or concerns. No barriers to learning was detected. The total time spent in the appointment was 30 minutes.     Truitt Merle, MD 06/10/2022   I, Wilburn Mylar, am acting as scribe for Truitt Merle, MD.   I have reviewed the above documentation for accuracy and completeness, and I agree with the above.

## 2022-06-11 ENCOUNTER — Other Ambulatory Visit: Payer: Self-pay

## 2022-09-06 ENCOUNTER — Other Ambulatory Visit: Payer: Self-pay

## 2022-09-06 ENCOUNTER — Inpatient Hospital Stay: Payer: Commercial Managed Care - HMO | Attending: Hematology

## 2022-09-06 DIAGNOSIS — Z79899 Other long term (current) drug therapy: Secondary | ICD-10-CM | POA: Diagnosis not present

## 2022-09-06 DIAGNOSIS — I10 Essential (primary) hypertension: Secondary | ICD-10-CM | POA: Diagnosis not present

## 2022-09-06 DIAGNOSIS — Z8509 Personal history of malignant neoplasm of other digestive organs: Secondary | ICD-10-CM | POA: Diagnosis present

## 2022-09-06 DIAGNOSIS — C23 Malignant neoplasm of gallbladder: Secondary | ICD-10-CM

## 2022-09-06 LAB — CMP (CANCER CENTER ONLY)
ALT: 13 U/L (ref 0–44)
AST: 15 U/L (ref 15–41)
Albumin: 4.1 g/dL (ref 3.5–5.0)
Alkaline Phosphatase: 101 U/L (ref 38–126)
Anion gap: 7 (ref 5–15)
BUN: 15 mg/dL (ref 6–20)
CO2: 25 mmol/L (ref 22–32)
Calcium: 9.2 mg/dL (ref 8.9–10.3)
Chloride: 106 mmol/L (ref 98–111)
Creatinine: 0.67 mg/dL (ref 0.44–1.00)
GFR, Estimated: >60 mL/min (ref >60)
Glucose, Bld: 107 mg/dL — ABNORMAL HIGH (ref 70–99)
Potassium: 3.8 mmol/L (ref 3.5–5.1)
Sodium: 138 mmol/L (ref 135–145)
Total Bilirubin: 0.8 mg/dL (ref 0.3–1.2)
Total Protein: 8.2 g/dL — ABNORMAL HIGH (ref 6.5–8.1)

## 2022-09-06 LAB — CBC WITH DIFFERENTIAL (CANCER CENTER ONLY)
Abs Immature Granulocytes: 0.02 10*3/uL (ref 0.00–0.07)
Basophils Absolute: 0 10*3/uL (ref 0.0–0.1)
Basophils Relative: 1 %
Eosinophils Absolute: 0.2 10*3/uL (ref 0.0–0.5)
Eosinophils Relative: 3 %
HCT: 40.4 % (ref 36.0–46.0)
Hemoglobin: 13.5 g/dL (ref 12.0–15.0)
Immature Granulocytes: 0 %
Lymphocytes Relative: 24 %
Lymphs Abs: 2 10*3/uL (ref 0.7–4.0)
MCH: 31.5 pg (ref 26.0–34.0)
MCHC: 33.4 g/dL (ref 30.0–36.0)
MCV: 94.4 fL (ref 80.0–100.0)
Monocytes Absolute: 0.6 10*3/uL (ref 0.1–1.0)
Monocytes Relative: 7 %
Neutro Abs: 5.5 10*3/uL (ref 1.7–7.7)
Neutrophils Relative %: 65 %
Platelet Count: 413 10*3/uL — ABNORMAL HIGH (ref 150–400)
RBC: 4.28 MIL/uL (ref 3.87–5.11)
RDW: 12.2 % (ref 11.5–15.5)
WBC Count: 8.4 10*3/uL (ref 4.0–10.5)
nRBC: 0 % (ref 0.0–0.2)

## 2022-09-07 ENCOUNTER — Encounter: Payer: Self-pay | Admitting: Hematology

## 2022-09-08 ENCOUNTER — Ambulatory Visit (HOSPITAL_COMMUNITY): Payer: Commercial Managed Care - HMO

## 2022-09-08 ENCOUNTER — Other Ambulatory Visit: Payer: Self-pay

## 2022-09-08 DIAGNOSIS — C23 Malignant neoplasm of gallbladder: Secondary | ICD-10-CM

## 2022-09-08 NOTE — Progress Notes (Signed)
Received Secure Chat message from [place] in [employer] stating that the pt's insurance will only Authorize the pt's Scan to be performed at [place].  Reentered the order to [place].  Spoke with pt's son regarding change in CT Scan location d/t insurance preference.  Pt's son verbalized understanding.  Provider him with the telephone number to [place] for pt to call and schedule her CT Scan.  Pt's son stated they will call today.

## 2022-09-09 ENCOUNTER — Other Ambulatory Visit: Payer: Self-pay

## 2022-09-10 ENCOUNTER — Ambulatory Visit: Payer: 59 | Admitting: Hematology

## 2022-09-13 ENCOUNTER — Encounter: Payer: Self-pay | Admitting: Hematology

## 2022-09-14 ENCOUNTER — Encounter: Payer: Self-pay | Admitting: Hematology

## 2022-09-15 ENCOUNTER — Encounter (HOSPITAL_COMMUNITY): Payer: Self-pay

## 2022-09-15 ENCOUNTER — Emergency Department (HOSPITAL_COMMUNITY)
Admission: EM | Admit: 2022-09-15 | Discharge: 2022-09-16 | Disposition: A | Discharge status: Home / Self Care | Payer: Commercial Managed Care - HMO | Attending: Emergency Medicine | Admitting: Emergency Medicine

## 2022-09-15 ENCOUNTER — Other Ambulatory Visit: Payer: Self-pay

## 2022-09-15 ENCOUNTER — Emergency Department (HOSPITAL_COMMUNITY): Payer: Commercial Managed Care - HMO

## 2022-09-15 DIAGNOSIS — R222 Localized swelling, mass and lump, trunk: Secondary | ICD-10-CM

## 2022-09-15 DIAGNOSIS — R799 Abnormal finding of blood chemistry, unspecified: Secondary | ICD-10-CM | POA: Diagnosis not present

## 2022-09-15 DIAGNOSIS — R1900 Intra-abdominal and pelvic swelling, mass and lump, unspecified site: Secondary | ICD-10-CM | POA: Insufficient documentation

## 2022-09-15 DIAGNOSIS — R1011 Right upper quadrant pain: Secondary | ICD-10-CM | POA: Insufficient documentation

## 2022-09-15 DIAGNOSIS — Z8509 Personal history of malignant neoplasm of other digestive organs: Secondary | ICD-10-CM | POA: Diagnosis not present

## 2022-09-15 LAB — CBC WITH DIFFERENTIAL/PLATELET
Abs Immature Granulocytes: 0.02 10*3/uL (ref 0.00–0.07)
Basophils Absolute: 0 10*3/uL (ref 0.0–0.1)
Basophils Relative: 0 %
Eosinophils Absolute: 0.2 10*3/uL (ref 0.0–0.5)
Eosinophils Relative: 2 %
HCT: 37.4 % (ref 36.0–46.0)
Hemoglobin: 12.3 g/dL (ref 12.0–15.0)
Immature Granulocytes: 0 %
Lymphocytes Relative: 18 %
Lymphs Abs: 1.6 10*3/uL (ref 0.7–4.0)
MCH: 31.3 pg (ref 26.0–34.0)
MCHC: 32.9 g/dL (ref 30.0–36.0)
MCV: 95.2 fL (ref 80.0–100.0)
Monocytes Absolute: 0.7 10*3/uL (ref 0.1–1.0)
Monocytes Relative: 8 %
Neutro Abs: 6.1 10*3/uL (ref 1.7–7.7)
Neutrophils Relative %: 72 %
Platelets: 451 10*3/uL — ABNORMAL HIGH (ref 150–400)
RBC: 3.93 MIL/uL (ref 3.87–5.11)
RDW: 12.2 % (ref 11.5–15.5)
WBC: 8.7 10*3/uL (ref 4.0–10.5)
nRBC: 0 % (ref 0.0–0.2)

## 2022-09-15 LAB — BASIC METABOLIC PANEL
Anion gap: 8 (ref 5–15)
BUN: 16 mg/dL (ref 6–20)
CO2: 24 mmol/L (ref 22–32)
Calcium: 8.9 mg/dL (ref 8.9–10.3)
Chloride: 105 mmol/L (ref 98–111)
Creatinine, Ser: 0.62 mg/dL (ref 0.44–1.00)
GFR, Estimated: >60 mL/min (ref >60)
Glucose, Bld: 135 mg/dL — ABNORMAL HIGH (ref 70–99)
Potassium: 3.8 mmol/L (ref 3.5–5.1)
Sodium: 137 mmol/L (ref 135–145)

## 2022-09-15 LAB — HEPATIC FUNCTION PANEL
ALT: 14 U/L (ref 0–44)
AST: 19 U/L (ref 15–41)
Albumin: 3.7 g/dL (ref 3.5–5.0)
Alkaline Phosphatase: 93 U/L (ref 38–126)
Bilirubin, Direct: 0.1 mg/dL (ref 0.0–0.2)
Indirect Bilirubin: 0.9 mg/dL (ref 0.3–0.9)
Total Bilirubin: 1 mg/dL (ref 0.3–1.2)
Total Protein: 8 g/dL (ref 6.5–8.1)

## 2022-09-15 LAB — LIPASE, BLOOD: Lipase: 43 U/L (ref 11–51)

## 2022-09-15 LAB — CBG MONITORING, ED: Glucose-Capillary: 134 mg/dL — ABNORMAL HIGH (ref 70–99)

## 2022-09-15 MED ORDER — IOHEXOL 300 MG/ML  SOLN
100.0000 mL | Freq: Once | INTRAMUSCULAR | Status: AC | PRN
  Administered 2022-09-15: 100 mL via INTRAVENOUS

## 2022-09-15 NOTE — ED Triage Notes (Signed)
Pt reports with sharp right abdominal pain since October 15.

## 2022-09-15 NOTE — ED Notes (Signed)
Urine cup given for urine sample

## 2022-09-15 NOTE — ED Provider Triage Note (Signed)
Emergency Medicine Provider Triage Evaluation Note  Michele Arnold , a 61 y.o. female  was evaluated in triage.  Pt complains of abdominal pain. States pain has been intermittent since 10/15 but progressively worsening this week. Pain is RUQ and RLQ. Describes it as stabbing pain. Denies n/v/d or fevers. Has history of adenocarcinoma of the gallbladder. Received chemotherapy and on surveillance. Was to have CT on 11/7 but could not tolerate pain. .  Review of Systems  Positive:  Negative: See above  Physical Exam  BP 129/65 (BP Location: Left Arm)   Pulse 91   Temp 98.7 F (37.1 C) (Oral)   Resp 17   SpO2 94%  Gen:   Awake, no distress   Resp:  Normal effort  MSK:   Moves extremities without difficulty  Other:  TTP of the RUQ/RLQ  Medical Decision Making  Medically screening exam initiated at 8:20 PM.  Appropriate orders placed.  Michele Arnold was informed that the remainder of the evaluation will be completed by another provider, this initial triage assessment does not replace that evaluation, and the importance of remaining in the ED until their evaluation is complete.     [name], PA-C 09/15/22 2021

## 2022-09-15 NOTE — ED Notes (Signed)
Pt needs 22 g or greater IV for CT  

## 2022-09-16 ENCOUNTER — Other Ambulatory Visit: Payer: Self-pay

## 2022-09-16 DIAGNOSIS — I10 Essential (primary) hypertension: Secondary | ICD-10-CM | POA: Insufficient documentation

## 2022-09-16 LAB — URINALYSIS, ROUTINE W REFLEX MICROSCOPIC
Bilirubin Urine: NEGATIVE
Glucose, UA: NEGATIVE mg/dL
Hgb urine dipstick: NEGATIVE
Ketones, ur: NEGATIVE mg/dL
Nitrite: NEGATIVE
Protein, ur: NEGATIVE mg/dL
Specific Gravity, Urine: <=1.005 (ref 1.005–1.030)
pH: 7 (ref 5.0–8.0)

## 2022-09-16 MED ORDER — FENTANYL CITRATE PF 50 MCG/ML IJ SOSY
50.0000 ug | PREFILLED_SYRINGE | Freq: Once | INTRAMUSCULAR | Status: AC
  Administered 2022-09-16: 50 ug via INTRAVENOUS
  Filled 2022-09-16: qty 1

## 2022-09-16 MED ORDER — HYDROCODONE-ACETAMINOPHEN 5-325 MG PO TABS: 1.0000 | ORAL_TABLET | Freq: Four times a day (QID) | ORAL | 0 refills | Status: DC | PRN

## 2022-09-16 MED ORDER — KETOROLAC TROMETHAMINE 15 MG/ML IJ SOLN
15.0000 mg | Freq: Once | INTRAMUSCULAR | Status: AC
  Administered 2022-09-16: 15 mg via INTRAVENOUS
  Filled 2022-09-16: qty 1

## 2022-09-16 NOTE — Discharge Instructions (Addendum)
You can start with Tylenol, but this does not help your pain you can take the Vicodin.  Do not take them at the same time. You do not have to go to your outpatient CAT scan appointment.  Please follow-up with your doctor on November 9

## 2022-09-16 NOTE — ED Provider Notes (Signed)
[facility] DEPT Provider Note   CSN: [id number] Arrival date & time: 09/15/22  1936     History  Chief Complaint  Patient presents with   Abdominal Pain    Michele Arnold is a 61 y.o. female.  The history is provided by the patient and a relative. The history is limited by a language barrier. A language interpreter was used ([place] [phone]).  Patient reports she has had right upper quadrant abdominal pain for several weeks.  No fevers or vomiting.  No diarrhea or change in bowel movements.  No chest pain or shortness of breath.  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  She has follow-up scans and oncology visit next week, the pain was not improving at home.  She has been taken Tylenol without relief.     Home Medications Prior to Admission medications   Medication Sig Start Date End Date Taking? Authorizing Provider  HYDROcodone-acetaminophen (NORCO/VICODIN) 5-325 MG tablet Take 1 tablet by mouth every 6 (six) hours as needed for severe pain. 09/16/22  Yes Ripley Fraise, MD  acetaminophen (TYLENOL) 325 MG tablet Take 2 tablets (650 mg total) by mouth every 6 (six) hours as needed for mild pain or fever. 07/24/21   Norm Parcel, PA-C  hydrochlorothiazide (MICROZIDE) 12.5 MG capsule Take 12.5 mg by mouth daily.    [provider]  lisinopril (ZESTRIL) 10 MG tablet Take 1 tablet (10 mg total) by mouth daily. 07/24/21   Geradine Girt, DO  prochlorperazine (COMPAZINE) 10 MG tablet Take 10 mg by mouth every 6 (six) hours as needed. 09/23/21   [provider]  urea (CARMOL) 10 % cream Apply topically to hands and feet 2 to 3 times daily as needed 01/21/22   Alla Feeling, NP      Allergies    Patient has no known allergies.    Review of Systems   Review of Systems  Constitutional:   Negative for fever.  Gastrointestinal:  Positive for abdominal pain. Negative for diarrhea, nausea and vomiting.    Physical Exam Updated Vital Signs BP 130/60   Pulse 72   Temp 99 F (37.2 C)   Resp 19   SpO2 97%  Physical Exam CONSTITUTIONAL: Appears older than stated age, no acute distress HEAD: Normocephalic/atraumatic EYES: EOMI/PERRL ENMT: Mucous membranes moist NECK: supple no meningeal signs CV: S1/S2 noted, no murmurs/rubs/gallops noted LUNGS: Lungs are clear to auscultation bilaterally, no apparent distress ABDOMEN: soft, moderate RUQ tenderness, masslike lesion palpated, no rebound or guarding, bowel sounds noted throughout abdomen NEURO: Pt is awake/alert/appropriate, moves all extremitiesx4.  No facial droop.   EXTREMITIES:  full ROM SKIN: warm, color normal PSYCH: no abnormalities of mood noted, alert and oriented to situation  ED Results / Procedures / Treatments   Labs (all labs ordered are listed, but only abnormal results are displayed) Labs Reviewed  BASIC METABOLIC PANEL - Abnormal; Notable for the following components:      Result Value   Glucose, Bld 135 (*)    All other components within normal limits  CBC WITH DIFFERENTIAL/PLATELET - Abnormal; Notable for the following components:   Platelets 451 (*)    All other components within normal limits  URINALYSIS, ROUTINE W REFLEX MICROSCOPIC - Abnormal; Notable for the following components:   Leukocytes,Ua TRACE (*)    Bacteria, UA RARE (*)  All other components within normal limits  CBG MONITORING, ED - Abnormal; Notable for the following components:   Glucose-Capillary 134 (*)    All other components within normal limits  HEPATIC FUNCTION PANEL  LIPASE, BLOOD    EKG None  Radiology CT Abdomen Pelvis W Contrast  Result Date: 09/15/2022 CLINICAL DATA:  Metastatic disease evaluation hx adenocarcinoma of the gallbladder (removed) - to have surveillence CT but having increasing pain x2 weeks.  EXAM: CT ABDOMEN AND PELVIS WITH CONTRAST TECHNIQUE: Multidetector CT imaging of the abdomen and pelvis was performed using the standard protocol following bolus administration of intravenous contrast. RADIATION DOSE REDUCTION: This exam was performed according to the departmental dose-optimization program which includes automated exposure control, adjustment of the mA and/or kV according to patient size and/or use of iterative reconstruction technique. CONTRAST:  161m OMNIPAQUE IOHEXOL 300 MG/ML  SOLN COMPARISON:  MR abdomen 11/28/2021, CT abdomen pelvis 07/21/2021 FINDINGS: Lower chest: No acute abnormality. Hepatobiliary: No focal liver abnormality. Stable 1.2 x 0.9 cm soft tissue density lesion within the gallbladder fossa/surgical resection bed (2:24). Status post cholecystectomy. No biliary dilatation. Pancreas: No focal lesion. Normal pancreatic contour. No surrounding inflammatory changes. No main pancreatic ductal dilatation. Spleen: Normal in size without focal abnormality. Adrenals/Urinary Tract: No adrenal nodule bilaterally. Bilateral kidneys enhance symmetrically. Stable 2.3 cm fluid dense lesion within the right kidney likely represents a simple renal cyst. Simple renal cysts, in the absence of clinically indicated signs/symptoms, require no independent follow-up. Couple punctate calcifications within left kidney. No right nephrolithiasis. No hydronephrosis. No hydroureter. The urinary bladder is unremarkable. On delayed imaging, there is no urothelial wall thickening and there are no filling defects in the opacified portions of the bilateral collecting systems or ureters. Stomach/Bowel: Stomach is within normal limits. No evidence of bowel wall thickening or dilatation. Colonic diverticulosis. Appendix appears normal. Vascular/Lymphatic: No abdominal aorta or iliac aneurysm. Mild atherosclerotic plaque of the aorta and its branches. No abdominal, pelvic, or inguinal lymphadenopathy. Reproductive:  Uterus and bilateral adnexa are unremarkable. Other: No intraperitoneal free fluid. No intraperitoneal free gas. No organized fluid collection. Musculoskeletal: Small fat containing umbilical hernia with an abdominal defect of 1.5 cm. Small fat containing infraumbilical ventral wall hernia with an abdominal wall defect of 0.2 cm. More inferior to this there is another small fat containing infraumbilical ventral hernia with an abdominal defect of 2.5 cm. Interval increase in size of a right rectus abdominus enhancing mass lesion measuring 7.6 x 5 cm (from 3 x 2.4 cm). Soft tissue densities within the gluteal regions bilaterally likely related to injection medication. No suspicious lytic or blastic osseous lesions. No acute displaced fracture. Levoscoliosis of the lumbar spine centered at the L2 level with multilevel degenerative changes of the spine. IMPRESSION: 1. Interval increase in size of a right rectus abdominus enhancing mass lesion measuring 7.6 x 5 cm (from 3 x 2.4 cm). Findings concerning for metastasis/recurrence in a patient with history of adenocarcinoma. 2. Stable 1.2 x 0.9 cm soft tissue density lesion within the gallbladder fossa/surgical resection bed. 3. Nonobstructive left nephrolithiasis. 4. Colonic diverticulosis with no acute diverticulitis. Electronically Signed   By: MIven FinnM.D.   On: 09/15/2022 21:48    Procedures Procedures    Medications Ordered in ED Medications  iohexol (OMNIPAQUE) 300 MG/ML solution 100 mL (100 mLs Intravenous Contrast Given 09/15/22 2122)  fentaNYL (SUBLIMAZE) injection 50 mcg (50 mcg Intravenous Given 09/16/22 0105)  ketorolac (TORADOL) 15 MG/ML injection 15 mg (15 mg Intravenous Given 09/16/22  [email or web address])    ED Course/ Medical Decision Making/ A&P Clinical Course as of 09/16/22 0156  Thu Sep 16, 2022  0116 Patient found to have lesion in the rectus abdominis near where her pain is located.  Likely metastatic lesion.  This was discussed at length with  patient and her family via interpreter.  Overall no other acute findings and she is safe for discharge home.  She already has follow-up next week with her oncologist.  Plan to provide pain management.  Patient is safe for discharge [DW]    Clinical Course User Index [DW] Ripley Fraise, MD                           Medical Decision Making Risk Prescription drug management.   This patient presents to the ED for concern of abdominal pain, this involves an extensive number of treatment options, and is a complaint that carries with it a high risk of complications and morbidity.  The differential diagnosis includes but is not limited to cholecystitis, cholelithiasis, pancreatitis, gastritis, peptic ulcer disease, appendicitis, bowel obstruction, bowel perforation, diverticulitis, AAA, ischemic bowel    Comorbidities that complicate the patient evaluation: Patient's presentation is complicated by their history of gallbladder adenocarcinoma  Social Determinants of Health: Patient's  English is a second language   increases the complexity of managing their presentation  Additional history obtained: Additional history obtained from family Records reviewed  oncology records reviewed  Lab Tests: I Ordered, and personally interpreted labs.  The pertinent results include: Mild hyperglycemia  Imaging Studies ordered: I ordered imaging studies including CT scan abdomen pelvis   I independently visualized and interpreted imaging which showed mass lesion noted I agree with the radiologist interpretation   Medicines ordered and prescription drug management: I ordered medication including fentanyl and Toradol for pain Reevaluation of the patient after these medicines showed that the patient    improved  Reevaluation: After the interventions noted above, I reevaluated the patient and found that they have :improved  Complexity of problems addressed: Patient's presentation is most consistent  with  acute presentation with potential threat to life or bodily function  Disposition: After consideration of the diagnostic results and the patient's response to treatment,  I feel that the patent would benefit from discharge   .           Final Clinical Impression(s) / ED Diagnoses Final diagnoses:  Abdominal wall mass    Rx / DC Orders ED Discharge Orders          Ordered    HYDROcodone-acetaminophen (NORCO/VICODIN) 5-325 MG tablet  Every 6 hours PRN        09/16/22 0059              Ripley Fraise, MD 09/16/22 [phone]

## 2022-09-16 NOTE — Progress Notes (Deleted)
[facility]   Telephone:(336) [phone] Fax:(336) [phone]   Clinic Follow up Note   Patient Care Team: Geradine Girt, DO as PCP - General (Internal Medicine) Dwan Bolt, MD as Consulting Physician (General Surgery) Truitt Merle, MD as Consulting Physician (Hematology) Bowen as Consulting Physician (Radiology)  Date of Service:  09/16/2022  CHIEF COMPLAINT: f/u of gallbladder cancer  CURRENT THERAPY:  Surveillance  ASSESSMENT & PLAN:  Michele Arnold is a 60 y.o. female with   1. 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  Work-up consistent with acute cholecystitis. On cholecystectomy by Dr. Hassell Done 07/23/21, she had incidental finding of poorly differentiated adenocarcinoma in the gallbladder neck, invading perimuscular soft tissue with a positive cystic duct margin.  -Staging work-up was negative for metastatic disease, baseline CA 19-9 was normal -s/p oncologic staging surgery, resection of positive margin, and portal lymph node dissection Dr. Zenia Resides on 09/04/21. Path showed no residual carcinoma, staged as pT2a N0 -completed adjuvant Xeloda 10/02/21 - 03/15/22. Held in 11/2021 for hyperbilirubinemia and abdominal pain (MRI was negative), tolerated better with reduced dose. -posttreatment CT AP on 06/07/22 showed a few soft tissue foci along the inferior resection cavity, nonspecific but possibly reflecting surgical change, close follow-up was recommended.  I reviewed the results with her today.  I recommend repeating CT scan in 3 months. -she is clinically doing very well, improving off treatment. Labs from 7/24 reviewed, overall WNL. There is no concern for recurrence.   2. Hypertension -On lisinopril and HCTZ, prescribed by Eulogio Bear, DO   3. Health maintenance, disease prevention -I encouraged her to  stay up-to-date on age-appropriate cancer screenings, vaccines, and live healthy active lifestyle -she is due for mammogram   4. Social -Lives with spouse, has 2 daughters. She has good family support -Followed by social work/financial for co-pay assistance with Xeloda -She officially became a [place] citizen on 02/04/22     Plan: -f/u in 3 months, with lab and CT AP several days before   No problem-specific Assessment & Plan notes found for this encounter.   SUMMARY OF ONCOLOGIC HISTORY: Oncology History  Adenocarcinoma of gallbladder ([facility])  07/21/2021 Imaging   CT AP IMPRESSION: 1. Cholelithiasis with distended gallbladder. No CT evidence of acute cholecystitis. Consider further evaluation with right upper quadrant ultrasound as clinically indicated. 2. Punctate nonobstructive left nephrolithiasis. 3. Aortic Atherosclerosis (ICD10-I70.0).   07/21/2021 Imaging   ABD [place] RUQ IMPRESSION: 1. Cholelithiasis without evidence of acute cholecystitis. 2. Mild hepatic steatosis.     07/21/2021 Imaging   MRA/MRCP IMPRESSION: 1. No biliary ductal dilation. No choledocholithiasis. 2. Mild hepatic steatosis. 3. Cholelithiasis with dilation of the gallbladder and trace pericholecystic fluid. No gallbladder wall thickening or abnormal wall enhancement. Findings which are equivocal for acute cholecystitis. Consider further evaluation with nuclear medicine HIDA scan to assess cystic duct patency if clinically indicated.   07/22/2021 Imaging   HIDA SCAN IMPRESSION: Scintigraphic findings most consistent with acute cholecystitis.   07/23/2021 Surgery   Preop Dx:        Acute cholecystitis Postop Dx:      Acute cholecystitis with 1 cm stone impacted in the infundibulum Procedure:      Xi robotic cholecystectomy with ICG By Dr. Johnathan Hausen   07/23/2021 Pathology Results   FINAL MICROSCOPIC DIAGNOSIS:  A. GALLBLADDER, CHOLECYSTECTOMY:  - Invasive poorly differentiated adenocarcinoma, 4.3 cm, involving  gallbladder neck  - Carcinoma invades perimuscular soft tissue  - Cystic duct margin is focally positive for carcinoma  - Focally suspicious for lymphovascular invasion   Procedure: Cholecystectomy  Tumor Site: Gallbladder neck  Tumor Size: 4.3 cm  Histologic Type: Adenocarcinoma  Histologic Grade: G3: Poorly differentiated  Tumor Extension: Tumor invades perimuscular connective tissue on the peritoneal side without serosal involvement  Margins:       Margin Status for Invasive Carcinoma: Cystic duct margin is positive for carcinoma  Regional Lymph Nodes: Not applicable (no lymph nodes submitted or found)  Distant Metastasis:       Distant Site(s) Involved: Not applicable  Pathologic Stage Classification (pTNM, AJCC 8th Edition): pT2a, pN not assigned    08/20/2021 Imaging   CT chest IMPRESSION: Negative. No CT evidence for acute intrathoracic abnormality. Negative for pulmonary nodule or evidence for metastatic disease to the chest.   09/01/2021 Tumor Marker   CA 19-9: 8 (normal)   09/04/2021 Initial Diagnosis   Adenocarcinoma of gallbladder ([facility])   09/04/2021 Definitive Surgery   Procedure: by Dr. Michaelle Birks Staging laparoscopy Exploratory laparotomy with intraoperative cholangiogram Excision of new cystic duct margin Partial central hepatectomy (resection of gallbladder fossa - segments 4b and 5) Portal lymph node dissection   09/04/2021 Pathology Results   FINAL MICROSCOPIC DIAGNOSIS:  A. CYSTIC DUCT, NEW MARGIN, EXCISION:  -  No adenocarcinoma identified  B. LYMPH NODE, PORTAL, EXCISION:  -  Nodular fat necrosis and fibrosis  -  No nodal tissue identified  C. GALLBLADDER, FOSSA, EXCISION:  -  Benign liver with periductular chronic inflammation and  macrovesicular steatosis  -  No adenocarcinoma identified  D. LYMPH NODE, STATION EIGHT, EXCISION:  -  No adenocarcinoma identified in one lymph node (0/1)    09/04/2021 Cancer Staging   Staging form: Gallbladder,  AJCC 8th Edition - Clinical stage from 09/04/2021: Stage IIA (cT2a, cN0, cM0) - Signed by Alla Feeling, NP on 09/21/2021 Stage prefix: Initial diagnosis Total positive nodes: 0 Histologic grade (G): G3 Histologic grading system: 3 grade system Histologic sub-type: Adenocarcinoma   09/28/2021 -  Chemotherapy   Patient is on Treatment Plan : BREAST Capecitabine q21d        INTERVAL HISTORY:  Michele Arnold is here for a follow up of gallbladder cancer. She was last seen by me on 03/11/22. She presents to the clinic accompanied by an interpreter She reports he is doing well. She tells me the pain in her hands and feet has resolved off Xeloda. She notes she recently got her [place] passport and is excitedly thinking about future travel plans.   All other systems were reviewed with the patient and are negative.  MEDICAL HISTORY:  Past Medical History:  Diagnosis Date   Cancer [facility])    Hypertension     SURGICAL HISTORY: Past Surgical History:  Procedure Laterality Date   CHOLECYSTECTOMY  07/23/2021   INTRAOPERATIVE CHOLANGIOGRAM N/A 09/04/2021   Procedure: INTRAOPERATIVE CHOLANGIOGRAM;  Surgeon: Dwan Bolt, MD;  Location: [facility];  Service: General;  Laterality: N/A;   LAPAROSCOPY N/A 09/04/2021   Procedure: STAGING LAPAROSCOPY;  Surgeon: Dwan Bolt, MD;  Location: [facility];  Service: General;  Laterality: N/A;   LYMPH NODE DISSECTION N/A 09/04/2021   Procedure: PORTAL LYMPH NODE DISSECTION;  Surgeon: Dwan Bolt, MD;  Location: [facility];  Service: General;  Laterality: N/A;   OPEN HEPATECTOMY  N/A 09/04/2021   Procedure: OPEN PARTIAL CENTRAL HEPATECTOMY;  Surgeon: Dwan Bolt, MD;  Location: [facility] OR;  Service: General;  Laterality: N/A;    I have reviewed the social history and family history with the patient and they are unchanged from previous note.  ALLERGIES:  has No Known Allergies.  MEDICATIONS:  Current Outpatient Medications  Medication Sig Dispense Refill    acetaminophen (TYLENOL) 325 MG tablet Take 2 tablets (650 mg total) by mouth every 6 (six) hours as needed for mild pain or fever.     atorvastatin (LIPITOR) 40 MG tablet Take 40 mg by mouth daily.     hydrochlorothiazide (MICROZIDE) 12.5 MG capsule Take 12.5 mg by mouth daily.     HYDROcodone-acetaminophen (NORCO/VICODIN) 5-325 MG tablet Take 1 tablet by mouth every 6 (six) hours as needed for severe pain. 10 tablet 0   lisinopril (ZESTRIL) 10 MG tablet Take 1 tablet (10 mg total) by mouth daily. 30 tablet 0   urea (CARMOL) 10 % cream Apply topically to hands and feet 2 to 3 times daily as needed 71 g 2   No current facility-administered medications for this visit.    PHYSICAL EXAMINATION: ECOG PERFORMANCE STATUS: 0 - Asymptomatic  There were no vitals filed for this visit.  Wt Readings from Last 3 Encounters:  06/10/22 164 lb 14.4 oz (74.8 kg)  03/11/22 163 lb 8 oz (74.2 kg)  02/08/22 160 lb 6.4 oz (72.8 kg)     GENERAL:alert, no distress and comfortable SKIN: skin color normal, no rashes or significant lesions EYES: normal, Conjunctiva are pink and non-injected, sclera clear  NEURO: alert & oriented x 3 with fluent speech  LABORATORY DATA:  I have reviewed the data as listed    Latest Ref Rng & Units 09/15/2022    8:35 PM 09/06/2022    9:56 AM 06/07/2022    2:59 PM  CBC  WBC 4.0 - 10.5 K/uL 8.7  8.4  7.1   Hemoglobin 12.0 - 15.0 g/dL 12.3  13.5  13.3   Hematocrit 36.0 - 46.0 % 37.4  40.4  39.1   Platelets 150 - 400 K/uL 451  413  315         Latest Ref Rng & Units 09/15/2022    8:35 PM 09/06/2022    9:56 AM 06/07/2022    2:59 PM  CMP  Glucose 70 - 99 mg/dL 135  107  74   BUN 6 - 20 mg/dL '16  15  17   '$ Creatinine 0.44 - 1.00 mg/dL 0.62  0.67  0.78   Sodium 135 - 145 mmol/L 137  138  142   Potassium 3.5 - 5.1 mmol/L 3.8  3.8  4.0   Chloride 98 - 111 mmol/L 105  106  107   CO2 22 - 32 mmol/L '24  25  30   '$ Calcium 8.9 - 10.3 mg/dL 8.9  9.2  9.2   Total Protein 6.5 -  8.1 g/dL 8.0  8.2  7.5   Total Bilirubin 0.3 - 1.2 mg/dL 1.0  0.8  1.3   Alkaline Phos 38 - 126 U/L 93  101  84   AST 15 - 41 U/L '19  15  26   '$ ALT 0 - 44 U/L '14  13  25       '$ RADIOGRAPHIC STUDIES: I have personally reviewed the radiological images as listed and agreed with the findings in the report. CT Abdomen Pelvis W Contrast  Result Date: 09/15/2022 CLINICAL DATA:  Metastatic disease evaluation hx adenocarcinoma of the gallbladder (removed) - to have surveillence CT  but having increasing pain x2 weeks. EXAM: CT ABDOMEN AND PELVIS WITH CONTRAST TECHNIQUE: Multidetector CT imaging of the abdomen and pelvis was performed using the standard protocol following bolus administration of intravenous contrast. RADIATION DOSE REDUCTION: This exam was performed according to the departmental dose-optimization program which includes automated exposure control, adjustment of the mA and/or kV according to patient size and/or use of iterative reconstruction technique. CONTRAST:  173m OMNIPAQUE IOHEXOL 300 MG/ML  SOLN COMPARISON:  MR abdomen 11/28/2021, CT abdomen pelvis 07/21/2021 FINDINGS: Lower chest: No acute abnormality. Hepatobiliary: No focal liver abnormality. Stable 1.2 x 0.9 cm soft tissue density lesion within the gallbladder fossa/surgical resection bed (2:24). Status post cholecystectomy. No biliary dilatation. Pancreas: No focal lesion. Normal pancreatic contour. No surrounding inflammatory changes. No main pancreatic ductal dilatation. Spleen: Normal in size without focal abnormality. Adrenals/Urinary Tract: No adrenal nodule bilaterally. Bilateral kidneys enhance symmetrically. Stable 2.3 cm fluid dense lesion within the right kidney likely represents a simple renal cyst. Simple renal cysts, in the absence of clinically indicated signs/symptoms, require no independent follow-up. Couple punctate calcifications within left kidney. No right nephrolithiasis. No hydronephrosis. No hydroureter. The urinary  bladder is unremarkable. On delayed imaging, there is no urothelial wall thickening and there are no filling defects in the opacified portions of the bilateral collecting systems or ureters. Stomach/Bowel: Stomach is within normal limits. No evidence of bowel wall thickening or dilatation. Colonic diverticulosis. Appendix appears normal. Vascular/Lymphatic: No abdominal aorta or iliac aneurysm. Mild atherosclerotic plaque of the aorta and its branches. No abdominal, pelvic, or inguinal lymphadenopathy. Reproductive: Uterus and bilateral adnexa are unremarkable. Other: No intraperitoneal free fluid. No intraperitoneal free gas. No organized fluid collection. Musculoskeletal: Small fat containing umbilical hernia with an abdominal defect of 1.5 cm. Small fat containing infraumbilical ventral wall hernia with an abdominal wall defect of 0.2 cm. More inferior to this there is another small fat containing infraumbilical ventral hernia with an abdominal defect of 2.5 cm. Interval increase in size of a right rectus abdominus enhancing mass lesion measuring 7.6 x 5 cm (from 3 x 2.4 cm). Soft tissue densities within the gluteal regions bilaterally likely related to injection medication. No suspicious lytic or blastic osseous lesions. No acute displaced fracture. Levoscoliosis of the lumbar spine centered at the L2 level with multilevel degenerative changes of the spine. IMPRESSION: 1. Interval increase in size of a right rectus abdominus enhancing mass lesion measuring 7.6 x 5 cm (from 3 x 2.4 cm). Findings concerning for metastasis/recurrence in a patient with history of adenocarcinoma. 2. Stable 1.2 x 0.9 cm soft tissue density lesion within the gallbladder fossa/surgical resection bed. 3. Nonobstructive left nephrolithiasis. 4. Colonic diverticulosis with no acute diverticulitis. Electronically Signed   By: MIven FinnM.D.   On: 09/15/2022 21:48      No orders of the defined types were placed in this  encounter.  All questions were answered. The patient knows to call the clinic with any problems, questions or concerns. No barriers to learning was detected. The total time spent in the appointment was 30 minutes.     YTruitt Merle MD 09/16/2022   I, KWilburn Mylar am acting as scribe for YTruitt Merle MD.   I have reviewed the above documentation for accuracy and completeness, and I agree with the above.

## 2022-09-16 NOTE — Assessment & Plan Note (Signed)
Continue meds. 

## 2022-09-16 NOTE — Assessment & Plan Note (Signed)
-  Diagnosed on 07/23/2021, which was incidentally found on cholecystectomy, status post oncological staging surgery by Dr. Zenia Resides on 09/04/21.  -Adipost adjuvant chemotherapy with Xeloda for 6 months.  -presented to ED 09/15/2022 and CT scan showed right rectus abdominus enhancing mass lesion measuring 7.6 x 5 cm, highly suspicious for metastatic recurrence.

## 2022-09-17 ENCOUNTER — Encounter: Payer: Self-pay | Admitting: Hematology

## 2022-09-17 ENCOUNTER — Other Ambulatory Visit: Payer: Self-pay

## 2022-09-17 ENCOUNTER — Inpatient Hospital Stay: Payer: Commercial Managed Care - HMO | Attending: Hematology | Admitting: Hematology

## 2022-09-17 ENCOUNTER — Encounter: Payer: Self-pay | Admitting: General Practice

## 2022-09-17 VITALS — BP 150/63 | HR 82 | Temp 97.8°F | Resp 14 | Ht 64.0 in | Wt 166.5 lb

## 2022-09-17 DIAGNOSIS — I10 Essential (primary) hypertension: Secondary | ICD-10-CM | POA: Insufficient documentation

## 2022-09-17 DIAGNOSIS — R109 Unspecified abdominal pain: Secondary | ICD-10-CM | POA: Insufficient documentation

## 2022-09-17 DIAGNOSIS — C23 Malignant neoplasm of gallbladder: Secondary | ICD-10-CM | POA: Diagnosis not present

## 2022-09-17 MED ORDER — HYDROCODONE-ACETAMINOPHEN 5-325 MG PO TABS: 1.0000 | ORAL_TABLET | Freq: Four times a day (QID) | ORAL | 0 refills | Status: DC | PRN

## 2022-09-17 NOTE — Progress Notes (Signed)
[place]   Telephone:([phone] Fax:([phone]) [phone]   Clinic Follow up Note   Patient Care Team: Geradine Girt, DO as PCP - General (Internal Medicine) Dwan Bolt, MD as Consulting Physician (General Surgery) Truitt Merle, MD as Consulting Physician (Hematology) [place] as Consulting Physician (Radiology)  Date of Service:  09/17/2022  CHIEF COMPLAINT: f/u of gallbladder cancer  CURRENT THERAPY:  Surveillance  ASSESSMENT & PLAN:  Michele Arnold is a 61 y.o. female with   1. 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 Zenia Resides on 09/04/21.  -Adipost adjuvant chemotherapy with Xeloda for 6 months.  -presented to ED 09/15/22 and CT scan showed right rectus abdominus enhancing mass lesion measuring 7.6 x 5 cm, highly suspicious for metastatic recurrence. -she reports pain to the right-sided abdomen that can radiate down her leg, I reviewed the scan results with them today. With both the scan results and her pain, this is very concerning for cancer recurrence. I recommend obtaining biopsy of the mass to confirm metastatic cancer and PET scan to rule out other distant metastasis. I ordered both today to be done soon. -if biopsy confirmed recurrence, will start her on chemotherapy first, and will discuss with Dr. Zenia Resides to see if she can remove the oligometastasis.  I reviewed the data with patient and her family, she is open to treatment.  2. Right-sided abdominal pain -she reports this pain worsened in mid 08/2022, rated 9/10 without intervention.  -she was given Norco in the ED on 09/15/22, which brings her pain down to a 2/10. I will refill for her today.  3. 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 Cholelithiasis with distended gallbladder. No CT evidence of acute cholecystitis. Consider further evaluation with right upper quadrant ultrasound as clinically indicated. 2. Punctate nonobstructive left nephrolithiasis. 3. Aortic Atherosclerosis (ICD10-I70.0).   07/21/2021 Imaging   ABD [place] RUQ IMPRESSION: 1. Cholelithiasis without evidence of acute cholecystitis. 2. Mild hepatic steatosis.     07/21/2021 Imaging   MRA/MRCP IMPRESSION: 1. No biliary ductal dilation. No choledocholithiasis. 2. Mild hepatic steatosis. 3. Cholelithiasis with dilation of the gallbladder and trace pericholecystic fluid. No gallbladder wall thickening or abnormal wall enhancement. Findings which are equivocal for acute cholecystitis. Consider further evaluation with nuclear medicine HIDA scan to assess cystic duct patency if clinically indicated.   07/22/2021 Imaging   HIDA SCAN IMPRESSION: Scintigraphic findings most consistent with acute cholecystitis.   07/23/2021 Surgery   Preop Dx:        Acute cholecystitis Postop Dx:      Acute cholecystitis with 1 cm stone impacted in the infundibulum Procedure:      Xi robotic cholecystectomy with ICG By Dr. Johnathan Hausen   07/23/2021 Pathology Results   FINAL MICROSCOPIC DIAGNOSIS:  A. GALLBLADDER, CHOLECYSTECTOMY:  - Invasive poorly differentiated adenocarcinoma, 4.3 cm, involving  gallbladder neck  - Carcinoma invades perimuscular soft tissue  - Cystic duct margin is focally positive for carcinoma  - Focally suspicious for lymphovascular invasion   Procedure: Cholecystectomy  Tumor Site: Gallbladder neck  Tumor Size: 4.3 cm  Histologic Type: Adenocarcinoma  Histologic Grade: G3: Poorly differentiated  Tumor  Extension: Tumor invades perimuscular connective tissue on the peritoneal side without serosal involvement  Margins:        Margin Status for Invasive Carcinoma: Cystic duct margin is positive for carcinoma  Regional Lymph Nodes: Not applicable (no lymph nodes submitted or found)  Distant Metastasis:       Distant Site(s) Involved: Not applicable  Pathologic Stage Classification (pTNM, AJCC 8th Edition): pT2a, pN not assigned    08/20/2021 Imaging   CT chest IMPRESSION: Negative. No CT evidence for acute intrathoracic abnormality. Negative for pulmonary nodule or evidence for metastatic disease to the chest.   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 Michaelle Birks Staging laparoscopy Exploratory laparotomy with intraoperative cholangiogram Excision of new cystic duct margin Partial central hepatectomy (resection of gallbladder fossa - segments 4b and 5) Portal lymph node dissection   09/04/2021 Pathology Results   FINAL MICROSCOPIC DIAGNOSIS:  A. CYSTIC DUCT, NEW MARGIN, EXCISION:  -  No adenocarcinoma identified  B. LYMPH NODE, PORTAL, EXCISION:  -  Nodular fat necrosis and fibrosis  -  No nodal tissue identified  C. GALLBLADDER, FOSSA, EXCISION:  -  Benign liver with periductular chronic inflammation and  macrovesicular steatosis  -  No adenocarcinoma identified  D. LYMPH NODE, STATION EIGHT, EXCISION:  -  No adenocarcinoma identified in one lymph node (0/1)    09/04/2021 Cancer Staging   Staging form: Gallbladder, AJCC 8th Edition - Clinical stage from 09/04/2021: Stage IIA (cT2a, cN0, cM0) - Signed by Alla Feeling, NP on 09/21/2021 Stage prefix: Initial diagnosis Total positive nodes: 0 Histologic grade (G): G3 Histologic grading system: 3 grade system Histologic sub-type: Adenocarcinoma   09/28/2021 -  Chemotherapy   Patient is on Treatment Plan : BREAST Capecitabine q21d        INTERVAL HISTORY:  Michele Arnold is here for a follow up of  gallbladder cancer. She was last seen by me on 06/10/22. She presents to the clinic accompanied by her husband and son. She reports worsening abdominal pain to the right side. She explains it got worse around 10/15. She notes it sometimes radiates down her leg. She was seen in the ED and prescribed norco. She endorses taking one tablet every 5-6 hours. She explains the pain is 9/10, and with the Norco it drops to 2/10.   All other systems were reviewed with the patient and are negative.  MEDICAL HISTORY:  Past Medical History:  Diagnosis Date   Cancer [facility])    Hypertension     SURGICAL HISTORY: Past Surgical History:  Procedure Laterality Date   CHOLECYSTECTOMY  07/23/2021   INTRAOPERATIVE CHOLANGIOGRAM N/A 09/04/2021   Procedure: INTRAOPERATIVE CHOLANGIOGRAM;  Surgeon: Dwan Bolt, MD;  Location: [place];  Service: General;  Laterality: N/A;   LAPAROSCOPY N/A 09/04/2021   Procedure: STAGING LAPAROSCOPY;  Surgeon: Dwan Bolt, MD;  Location: [place];  Service: General;  Laterality: N/A;   LYMPH NODE DISSECTION N/A 09/04/2021   Procedure: PORTAL LYMPH NODE DISSECTION;  Surgeon: Dwan Bolt, MD;  Location: [place];  Service: General;  Laterality: N/A;   OPEN HEPATECTOMY  N/A 09/04/2021   Procedure: OPEN PARTIAL CENTRAL HEPATECTOMY;  Surgeon: Dwan Bolt, MD;  Location: [place];  Service: General;  Laterality: N/A;    I have reviewed the social history and family history with the patient and they are unchanged from  previous note.  ALLERGIES:  has No Known Allergies.  MEDICATIONS:  Current Outpatient Medications  Medication Sig Dispense Refill   acetaminophen (TYLENOL) 325 MG tablet Take 2 tablets (650 mg total) by mouth every 6 (six) hours as needed for mild pain or fever.     atorvastatin (LIPITOR) 40 MG tablet Take 40 mg by mouth daily.     hydrochlorothiazide (MICROZIDE) 12.5 MG capsule Take 12.5 mg by mouth daily.     HYDROcodone-acetaminophen (NORCO/VICODIN) 5-325 MG  tablet Take 1 tablet by mouth every 6 (six) hours as needed for severe pain. 60 tablet 0   lisinopril (ZESTRIL) 10 MG tablet Take 1 tablet (10 mg total) by mouth daily. 30 tablet 0   urea (CARMOL) 10 % cream Apply topically to hands and feet 2 to 3 times daily as needed 71 g 2   No current facility-administered medications for this visit.    PHYSICAL EXAMINATION: ECOG PERFORMANCE STATUS: 1 - Symptomatic but completely ambulatory  Vitals:   09/17/22 0811  BP: (!) 150/63  Pulse: 82  Resp: 14  Temp: 97.8 F (36.6 C)  SpO2: 96%   Wt Readings from Last 3 Encounters:  09/17/22 166 lb 8 oz (75.5 kg)  06/10/22 164 lb 14.4 oz (74.8 kg)  03/11/22 163 lb 8 oz (74.2 kg)     GENERAL:alert, no distress and comfortable SKIN: skin color, texture, turgor are normal, no rashes or significant lesions EYES: normal, Conjunctiva are pink and non-injected, sclera clear  NECK: supple, thyroid normal size, non-tender, without nodularity LYMPH:  no palpable lymphadenopathy in the cervical, axillary  LUNGS: clear to auscultation and percussion with normal breathing effort HEART: regular rate & rhythm and no murmurs and no lower extremity edema ABDOMEN: soft, and normal bowel sounds, (+) right side mildly tender to palpation, no discrete palpable abdominal mass. Musculoskeletal:no cyanosis of digits and no clubbing  NEURO: alert & oriented x 3 with fluent speech, no focal motor/sensory deficits  LABORATORY DATA:  I have reviewed the data as listed    Latest Ref Rng & Units 09/15/2022    8:35 PM 09/06/2022    9:56 AM 06/07/2022    2:59 PM  CBC  WBC 4.0 - 10.5 K/uL 8.7  8.4  7.1   Hemoglobin 12.0 - 15.0 g/dL 12.3  13.5  13.3   Hematocrit 36.0 - 46.0 % 37.4  40.4  39.1   Platelets 150 - 400 K/uL 451  413  315         Latest Ref Rng & Units 09/15/2022    8:35 PM 09/06/2022    9:56 AM 06/07/2022    2:59 PM  CMP  Glucose 70 - 99 mg/dL 135  107  74   BUN 6 - 20 mg/dL '16  15  17   '$ Creatinine 0.44  - 1.00 mg/dL 0.62  0.67  0.78   Sodium 135 - 145 mmol/L 137  138  142   Potassium 3.5 - 5.1 mmol/L 3.8  3.8  4.0   Chloride 98 - 111 mmol/L 105  106  107   CO2 22 - 32 mmol/L '24  25  30   '$ Calcium 8.9 - 10.3 mg/dL 8.9  9.2  9.2   Total Protein 6.5 - 8.1 g/dL 8.0  8.2  7.5   Total Bilirubin 0.3 - 1.2 mg/dL 1.0  0.8  1.3   Alkaline Phos 38 - 126 U/L 93  101  84   AST 15 - 41 U/L 19  15  26   ALT 0 - 44 U/L '14  13  25       '$ RADIOGRAPHIC STUDIES: I have personally reviewed the radiological images as listed and agreed with the findings in the report. CT Abdomen Pelvis W Contrast  Result Date: 09/15/2022 CLINICAL DATA:  Metastatic disease evaluation hx adenocarcinoma of the gallbladder (removed) - to have surveillence CT but having increasing pain x2 weeks. EXAM: CT ABDOMEN AND PELVIS WITH CONTRAST TECHNIQUE: Multidetector CT imaging of the abdomen and pelvis was performed using the standard protocol following bolus administration of intravenous contrast. RADIATION DOSE REDUCTION: This exam was performed according to the departmental dose-optimization program which includes automated exposure control, adjustment of the mA and/or kV according to patient size and/or use of iterative reconstruction technique. CONTRAST:  137m OMNIPAQUE IOHEXOL 300 MG/ML  SOLN COMPARISON:  MR abdomen 11/28/2021, CT abdomen pelvis 07/21/2021 FINDINGS: Lower chest: No acute abnormality. Hepatobiliary: No focal liver abnormality. Stable 1.2 x 0.9 cm soft tissue density lesion within the gallbladder fossa/surgical resection bed (2:24). Status post cholecystectomy. No biliary dilatation. Pancreas: No focal lesion. Normal pancreatic contour. No surrounding inflammatory changes. No main pancreatic ductal dilatation. Spleen: Normal in size without focal abnormality. Adrenals/Urinary Tract: No adrenal nodule bilaterally. Bilateral kidneys enhance symmetrically. Stable 2.3 cm fluid dense lesion within the right kidney likely  represents a simple renal cyst. Simple renal cysts, in the absence of clinically indicated signs/symptoms, require no independent follow-up. Couple punctate calcifications within left kidney. No right nephrolithiasis. No hydronephrosis. No hydroureter. The urinary bladder is unremarkable. On delayed imaging, there is no urothelial wall thickening and there are no filling defects in the opacified portions of the bilateral collecting systems or ureters. Stomach/Bowel: Stomach is within normal limits. No evidence of bowel wall thickening or dilatation. Colonic diverticulosis. Appendix appears normal. Vascular/Lymphatic: No abdominal aorta or iliac aneurysm. Mild atherosclerotic plaque of the aorta and its branches. No abdominal, pelvic, or inguinal lymphadenopathy. Reproductive: Uterus and bilateral adnexa are unremarkable. Other: No intraperitoneal free fluid. No intraperitoneal free gas. No organized fluid collection. Musculoskeletal: Small fat containing umbilical hernia with an abdominal defect of 1.5 cm. Small fat containing infraumbilical ventral wall hernia with an abdominal wall defect of 0.2 cm. More inferior to this there is another small fat containing infraumbilical ventral hernia with an abdominal defect of 2.5 cm. Interval increase in size of a right rectus abdominus enhancing mass lesion measuring 7.6 x 5 cm (from 3 x 2.4 cm). Soft tissue densities within the gluteal regions bilaterally likely related to injection medication. No suspicious lytic or blastic osseous lesions. No acute displaced fracture. Levoscoliosis of the lumbar spine centered at the L2 level with multilevel degenerative changes of the spine. IMPRESSION: 1. Interval increase in size of a right rectus abdominus enhancing mass lesion measuring 7.6 x 5 cm (from 3 x 2.4 cm). Findings concerning for metastasis/recurrence in a patient with history of adenocarcinoma. 2. Stable 1.2 x 0.9 cm soft tissue density lesion within the gallbladder  fossa/surgical resection bed. 3. Nonobstructive left nephrolithiasis. 4. Colonic diverticulosis with no acute diverticulitis. Electronically Signed   By: MIven FinnM.D.   On: 09/15/2022 21:48      Orders Placed This Encounter  Procedures   NM PET Image Initial (PI) Skull Base To Thigh    Standing Status:   Future    Standing Expiration Date:   09/17/2023    Order Specific Question:   If indicated for the ordered procedure, I authorize the administration of a radiopharmaceutical  per Radiology protocol    Answer:   Yes    Order Specific Question:   Is the patient pregnant?    Answer:   No    Order Specific Question:   Preferred imaging location?    Answer:   [facility]   [place] CORE BIOPSY (SOFT TISSUE)    Standing Status:   Future    Standing Expiration Date:   09/18/2023    Order Specific Question:   Lab orders requested (DO NOT place separate lab orders, these will be automatically ordered during procedure specimen collection):    Answer:   Surgical Pathology    Order Specific Question:   Reason for Exam (SYMPTOM  OR DIAGNOSIS REQUIRED)    Answer:   confirm cancer recurrence    Order Specific Question:   Preferred location?    Answer:   [facility]   Ambulatory Referral to [facility]    Referral Priority:   Urgent    Referral Type:   Consultation    Referral Reason:   Specialty Services Required    Number of Visits Requested:   1   All questions were answered. The patient knows to call the clinic with any problems, questions or concerns. No barriers to learning was detected. The total time spent in the appointment was 40 minutes.     Truitt Merle, MD 09/17/2022   I, Wilburn Mylar, am acting as scribe for Truitt Merle, MD.   I have reviewed the above documentation for accuracy and completeness, and I agree with the above.

## 2022-09-17 NOTE — Progress Notes (Signed)
Arne Cleveland, MD  Allen Kell, NT Ok  [place] core R ant abd wall mass  DDH       Previous Messages    ----- Message ----- From: Allen Kell, NT Sent: 09/17/2022   9:19 AM EDT To: Ir Procedure Requests Subject: [place] CORE BIOPSY (SOFT TISSUE)                  Procedure: [place] CORE BIOPSY (SOFT TISSUE)  Reason: confirm cancer recurrence  History: CT in chart NM schedule  Provider: Truitt Merle, MD  Contact: [phone]

## 2022-09-18 ENCOUNTER — Other Ambulatory Visit: Payer: Self-pay

## 2022-09-21 ENCOUNTER — Other Ambulatory Visit: Payer: Commercial Managed Care - HMO

## 2022-09-21 ENCOUNTER — Telehealth: Payer: Self-pay | Admitting: Hematology

## 2022-09-21 NOTE — Telephone Encounter (Signed)
Attempted to call patient per 11/3 notes. Interpreter line busy. Mailing patient calendar.

## 2022-09-23 ENCOUNTER — Ambulatory Visit: Payer: Commercial Managed Care - HMO | Admitting: Hematology

## 2022-09-28 NOTE — H&P (Addendum)
Chief Complaint: Patient was seen in consultation today for abdominal wall mass at the request of Michele Arnold,Michele Arnold  Referring Physician(s): Michele Arnold,Michele Arnold  Supervising Physician: Sandi Mariscal  Patient Status: Michele Arnold  History of Present Illness: Michele Arnold is a 61 y.o. female with PMH significant for adenocarcinoma of gallbladder diagnosed 07/23/2021, treated with chemotherapy for 6 months.  Patient presented to ED 09/15/2022 complaining of right upper quadrant abdominal pain for several weeks..  CT AP at that time showed right rectus abdominis enhancing mass lesion suspicious for metastatic recurrence.  Patient is followed by Dr. Burr Medico, oncology, and has referred patient to IR for abdominal wall mass biopsy.  Procedure was approved by Dr. Vernard Gambles.  Pt denies fever, chills, CP, SOB or N/V. She endorses right abdominal pain and occasional HA.  She is NPO per order.   Past Medical History:  Diagnosis Date   Cancer [facility])    Hypertension     Past Surgical History:  Procedure Laterality Date   CHOLECYSTECTOMY  07/23/2021   INTRAOPERATIVE CHOLANGIOGRAM N/A 09/04/2021   Procedure: INTRAOPERATIVE CHOLANGIOGRAM;  Surgeon: Dwan Bolt, MD;  Location: [place];  Service: General;  Laterality: N/A;   LAPAROSCOPY N/A 09/04/2021   Procedure: STAGING LAPAROSCOPY;  Surgeon: Dwan Bolt, MD;  Location: [place];  Service: General;  Laterality: N/A;   LYMPH NODE DISSECTION N/A 09/04/2021   Procedure: PORTAL LYMPH NODE DISSECTION;  Surgeon: Dwan Bolt, MD;  Location: [place];  Service: General;  Laterality: N/A;   OPEN HEPATECTOMY  N/A 09/04/2021   Procedure: OPEN PARTIAL CENTRAL HEPATECTOMY;  Surgeon: Dwan Bolt, MD;  Location: [place];  Service: General;  Laterality: N/A;    Allergies: Patient has no known allergies.  Medications: Prior to Admission medications   Medication Sig Start Date End Date Taking? Authorizing Provider  acetaminophen (TYLENOL) 325 MG tablet Take 2 tablets (650  mg total) by mouth every 6 (six) hours as needed for mild pain or fever. 07/24/21   Norm Parcel, PA-C  atorvastatin (LIPITOR) 40 MG tablet Take 40 mg by mouth daily.    [provider]  hydrochlorothiazide (MICROZIDE) 12.5 MG capsule Take 12.5 mg by mouth daily.    [provider]  HYDROcodone-acetaminophen (NORCO/VICODIN) 5-325 MG tablet Take 1 tablet by mouth every 6 (six) hours as needed for severe pain. 09/17/22   Truitt Merle, MD  lisinopril (ZESTRIL) 10 MG tablet Take 1 tablet (10 mg total) by mouth daily. 07/24/21   Geradine Girt, DO  urea (CARMOL) 10 % cream Apply topically to hands and feet 2 to 3 times daily as needed 01/21/22   Alla Feeling, NP     Family History  Problem Relation Age of Onset   Cancer Maternal Aunt        unknown type    Social History   Socioeconomic History   Marital status: Married    Spouse name: Not on file   Number of children: Not on file   Years of education: Not on file   Highest education level: Not on file  Occupational History   Not on file  Tobacco Use   Smoking status: Never   Smokeless tobacco: Never  Vaping Use   Vaping Use: Never used  Substance and Sexual Activity   Alcohol use: Not Currently    Comment: 2-3 times per year, special occasion   Drug use: Never   Sexual activity: Not on file  Other Topics Concern   Not on file  Social History Narrative   Not on file   Social Determinants of Health   Financial Resource Strain: Not on file  Food Insecurity: Not on file  Transportation Needs: Not on file  Physical Activity: Not on file  Stress: Not on file  Social Connections: Not on file     Review of Systems: A 12 point ROS discussed and pertinent positives are indicated in the HPI above.  All other systems are negative.  Review of Systems  Constitutional:  Negative for chills and fever.  Respiratory:  Negative for shortness of breath.   Cardiovascular:  Negative for chest pain.  Gastrointestinal:   Positive for abdominal pain. Negative for nausea and vomiting.  Neurological:  Positive for headaches.    Vital Signs: Ht '5\' 4"'$  (1.626 m)   Wt 166 lb 8 oz (75.5 kg)   BMI 28.58 kg/m     Physical Exam Constitutional:      General: She is not in acute distress.    Appearance: Normal appearance. She is not ill-appearing.  HENT:     Mouth/Throat:     Mouth: Mucous membranes are dry.     Pharynx: Oropharynx is clear.  Cardiovascular:     Rate and Rhythm: Normal rate and regular rhythm.     Pulses: Normal pulses.     Heart sounds: Normal heart sounds.  Pulmonary:     Effort: Pulmonary effort is normal. No respiratory distress.     Breath sounds: Normal breath sounds.  Abdominal:     General: Bowel sounds are normal. There is no distension.     Palpations: Abdomen is soft.     Tenderness: There is abdominal tenderness. There is no guarding.  Musculoskeletal:     Right lower leg: No edema.     Left lower leg: No edema.  Skin:    General: Skin is warm and dry.  Neurological:     Mental Status: She is alert and oriented to person, place, and time.  Psychiatric:        Mood and Affect: Mood normal.        Behavior: Behavior normal.        Thought Content: Thought content normal.        Judgment: Judgment normal.     Imaging: CT Abdomen Pelvis W Contrast  Result Date: 09/15/2022 CLINICAL DATA:  Metastatic disease evaluation hx adenocarcinoma of the gallbladder (removed) - to have surveillence CT but having increasing pain x2 weeks. EXAM: CT ABDOMEN AND PELVIS WITH CONTRAST TECHNIQUE: Multidetector CT imaging of the abdomen and pelvis was performed using the standard protocol following bolus administration of intravenous contrast. RADIATION DOSE REDUCTION: This exam was performed according to the departmental dose-optimization program which includes automated exposure control, adjustment of the mA and/or kV according to patient size and/or use of iterative reconstruction  technique. CONTRAST:  164m OMNIPAQUE IOHEXOL 300 MG/ML  SOLN COMPARISON:  MR abdomen 11/28/2021, CT abdomen pelvis 07/21/2021 FINDINGS: Lower chest: No acute abnormality. Hepatobiliary: No focal liver abnormality. Stable 1.2 x 0.9 cm soft tissue density lesion within the gallbladder fossa/surgical resection bed (2:24). Status post cholecystectomy. No biliary dilatation. Pancreas: No focal lesion. Normal pancreatic contour. No surrounding inflammatory changes. No main pancreatic ductal dilatation. Spleen: Normal in size without focal abnormality. Adrenals/Urinary Tract: No adrenal nodule bilaterally. Bilateral kidneys enhance symmetrically. Stable 2.3 cm fluid dense lesion within the right kidney likely represents a simple renal cyst. Simple renal cysts, in the absence of clinically indicated signs/symptoms, require no independent follow-up.  Couple punctate calcifications within left kidney. No right nephrolithiasis. No hydronephrosis. No hydroureter. The urinary bladder is unremarkable. On delayed imaging, there is no urothelial wall thickening and there are no filling defects in the opacified portions of the bilateral collecting systems or ureters. Stomach/Bowel: Stomach is within normal limits. No evidence of bowel wall thickening or dilatation. Colonic diverticulosis. Appendix appears normal. Vascular/Lymphatic: No abdominal aorta or iliac aneurysm. Mild atherosclerotic plaque of the aorta and its branches. No abdominal, pelvic, or inguinal lymphadenopathy. Reproductive: Uterus and bilateral adnexa are unremarkable. Other: No intraperitoneal free fluid. No intraperitoneal free gas. No organized fluid collection. Musculoskeletal: Small fat containing umbilical hernia with an abdominal defect of 1.5 cm. Small fat containing infraumbilical ventral wall hernia with an abdominal wall defect of 0.2 cm. More inferior to this there is another small fat containing infraumbilical ventral hernia with an abdominal defect of  2.5 cm. Interval increase in size of a right rectus abdominus enhancing mass lesion measuring 7.6 x 5 cm (from 3 x 2.4 cm). Soft tissue densities within the gluteal regions bilaterally likely related to injection medication. No suspicious lytic or blastic osseous lesions. No acute displaced fracture. Levoscoliosis of the lumbar spine centered at the L2 level with multilevel degenerative changes of the spine. IMPRESSION: 1. Interval increase in size of a right rectus abdominus enhancing mass lesion measuring 7.6 x 5 cm (from 3 x 2.4 cm). Findings concerning for metastasis/recurrence in a patient with history of adenocarcinoma. 2. Stable 1.2 x 0.9 cm soft tissue density lesion within the gallbladder fossa/surgical resection bed. 3. Nonobstructive left nephrolithiasis. 4. Colonic diverticulosis with no acute diverticulitis. Electronically Signed   By: Iven Finn M.D.   On: 09/15/2022 21:48    Labs:  CBC: Recent Labs    03/11/22 1029 06/07/22 1459 09/06/22 0956 09/15/22 2035  WBC 5.5 7.1 8.4 8.7  HGB 13.4 13.3 13.5 12.3  HCT 38.5 39.1 40.4 37.4  PLT 332 315 413* 451*    COAGS: No results for input(s): "INR", "APTT" in the last 8760 hours.  BMP: Recent Labs    03/11/22 1029 06/07/22 1459 09/06/22 0956 09/15/22 2035  NA 140 142 138 137  K 3.9 4.0 3.8 3.8  CL 105 107 106 105  CO2 '30 30 25 24  '$ GLUCOSE 107* 74 107* 135*  BUN '11 17 15 16  '$ CALCIUM 9.4 9.2 9.2 8.9  CREATININE 0.70 0.78 0.67 0.62  GFRNONAA >60 >60 >60 >60    LIVER FUNCTION TESTS: Recent Labs    03/11/22 1029 06/07/22 1459 09/06/22 0956 09/15/22 2035  BILITOT 2.3* 1.3* 0.8 1.0  AST '23 26 15 19  '$ ALT '18 25 13 14  '$ ALKPHOS 84 84 101 93  PROT 7.3 7.5 8.2* 8.0  ALBUMIN 4.2 4.2 4.1 3.7    TUMOR MARKERS: No results for input(s): "AFPTM", "CEA", "CA199", "CHROMGRNA" in the last 8760 hours.  Assessment and Plan: 61 year old female presents to IR for abdominal mass biopsy.  Exam performed with the assistance of  interpreter services.  Pt resting on stretcher.  She is A&O, calm and pleasant.  She is in no distress.   Risks and benefits of right anterior abdominal wall mass biopsy with moderate sedation was discussed with the patient and/or patient's family including, but not limited to bleeding, infection, damage to adjacent structures or low yield requiring additional tests.  All of the questions were answered and there is agreement to proceed.  Consent signed and in chart.  Thank you for this interesting consult.  I greatly enjoyed meeting Michaelene Dutan and look forward to participating in their care.  A copy of this report was sent to the requesting provider on this date.  Electronically Signed: Tyson Alias, NP 09/30/2022, 11:39 AM   I spent a total of 20 minutes in face to face in clinical consultation, greater than 50% of which was counseling/coordinating care for abdominal wall mass.

## 2022-09-29 ENCOUNTER — Other Ambulatory Visit (HOSPITAL_COMMUNITY): Payer: Self-pay | Admitting: Physician Assistant

## 2022-09-29 DIAGNOSIS — Z01818 Encounter for other preprocedural examination: Secondary | ICD-10-CM

## 2022-09-30 ENCOUNTER — Ambulatory Visit (HOSPITAL_COMMUNITY)
Admission: RE | Admit: 2022-09-30 | Discharge: 2022-09-30 | Disposition: A | Discharge status: Home / Self Care | Payer: Commercial Managed Care - HMO | Source: Ambulatory Visit | Attending: Hematology | Admitting: Hematology

## 2022-09-30 ENCOUNTER — Encounter (HOSPITAL_COMMUNITY): Payer: Self-pay

## 2022-09-30 ENCOUNTER — Other Ambulatory Visit: Payer: Self-pay

## 2022-09-30 DIAGNOSIS — R1909 Other intra-abdominal and pelvic swelling, mass and lump: Secondary | ICD-10-CM | POA: Diagnosis present

## 2022-09-30 DIAGNOSIS — C7989 Secondary malignant neoplasm of other specified sites: Secondary | ICD-10-CM | POA: Insufficient documentation

## 2022-09-30 DIAGNOSIS — C23 Malignant neoplasm of gallbladder: Secondary | ICD-10-CM

## 2022-09-30 DIAGNOSIS — Z01818 Encounter for other preprocedural examination: Secondary | ICD-10-CM

## 2022-09-30 DIAGNOSIS — Z8509 Personal history of malignant neoplasm of other digestive organs: Secondary | ICD-10-CM | POA: Insufficient documentation

## 2022-09-30 LAB — CBC
HCT: 37.6 % (ref 36.0–46.0)
Hemoglobin: 12.4 g/dL (ref 12.0–15.0)
MCH: 31.2 pg (ref 26.0–34.0)
MCHC: 33 g/dL (ref 30.0–36.0)
MCV: 94.7 fL (ref 80.0–100.0)
Platelets: 505 10*3/uL — ABNORMAL HIGH (ref 150–400)
RBC: 3.97 MIL/uL (ref 3.87–5.11)
RDW: 12 % (ref 11.5–15.5)
WBC: 10.6 10*3/uL — ABNORMAL HIGH (ref 4.0–10.5)
nRBC: 0 % (ref 0.0–0.2)

## 2022-09-30 LAB — PROTIME-INR
INR: 1.1 (ref 0.8–1.2)
Prothrombin Time: 13.8 seconds (ref 11.4–15.2)

## 2022-09-30 MED ORDER — LIDOCAINE HCL (PF) 1 % IJ SOLN
INTRAMUSCULAR | Status: AC | PRN
  Administered 2022-09-30: 15 mL

## 2022-09-30 MED ORDER — MIDAZOLAM HCL 2 MG/2ML IJ SOLN
INTRAMUSCULAR | Status: AC
  Filled 2022-09-30: qty 2

## 2022-09-30 MED ORDER — GELATIN ABSORBABLE 12-7 MM EX MISC
CUTANEOUS | Status: AC
  Filled 2022-09-30: qty 1

## 2022-09-30 MED ORDER — HYDROCODONE-ACETAMINOPHEN 5-325 MG PO TABS
ORAL_TABLET | ORAL | Status: AC
  Filled 2022-09-30: qty 1

## 2022-09-30 MED ORDER — FENTANYL CITRATE (PF) 100 MCG/2ML IJ SOLN
INTRAMUSCULAR | Status: AC
  Filled 2022-09-30: qty 2

## 2022-09-30 MED ORDER — MIDAZOLAM HCL 2 MG/2ML IJ SOLN
INTRAMUSCULAR | Status: AC | PRN
  Administered 2022-09-30: 1 mg via INTRAVENOUS
  Administered 2022-09-30: .5 mg via INTRAVENOUS

## 2022-09-30 MED ORDER — HYDROCODONE-ACETAMINOPHEN 5-325 MG PO TABS: 1.0000 | ORAL_TABLET | Freq: Once | ORAL | Status: DC

## 2022-09-30 MED ORDER — LIDOCAINE HCL 1 % IJ SOLN
INTRAMUSCULAR | Status: AC
  Filled 2022-09-30: qty 20

## 2022-09-30 MED ORDER — SODIUM CHLORIDE 0.9 % IV SOLN: INTRAVENOUS | Status: DC

## 2022-09-30 MED ORDER — FENTANYL CITRATE (PF) 100 MCG/2ML IJ SOLN
INTRAMUSCULAR | Status: AC | PRN
  Administered 2022-09-30: 50 ug via INTRAVENOUS
  Administered 2022-09-30 (×2): 25 ug via INTRAVENOUS

## 2022-09-30 NOTE — Procedures (Signed)
Pre Procedure Dx: Abdominal wall mass Post Procedural Dx: Same  Technically successful US guided biopsy of enlarging right anterior abdominal wall mass.  EBL: Trace No immediate complications.   Ronny Bacon, MD Pager #: [phone]

## 2022-09-30 NOTE — Sedation Documentation (Signed)
Sample obtained 

## 2022-09-30 NOTE — Discharge Instructions (Addendum)
Please call Interventional Radiology clinic [phone] with any questions or concerns.   You may remove your dressing and shower tomorrow.    Needle Biopsy, Care After These instructions tell you how to care for yourself after your procedure. Your doctor may also give you more specific instructions. Call your doctor if youhave any problems or questions. What can I expect after the procedure? After the procedure, it is common to have: Soreness. Bruising. Mild pain. Follow these instructions at home:  Return to your normal activities as told by your doctor. Ask your doctor what activities are safe for you. Take over-the-counter and prescription medicines only as told by your doctor. Wash your hands with soap and water before you change your bandage (dressing). If you cannot use soap and water, use hand sanitizer. Follow instructions from your doctor about: How to take care of your puncture site. When and how to change your bandage. When to remove your bandage. Check your puncture site every day for signs of infection. Watch for: Redness, swelling, or pain. Fluid or blood. Pus or a bad smell. Warmth. Do not take baths, swim, or use a hot tub until your doctor approves. Ask your doctor if you may take showers. You may only be allowed to take sponge baths. Keep all follow-up visits as told by your doctor. This is important. Contact a doctor if you have: A fever. Redness, swelling, or pain at the puncture site, and it lasts longer than a few days. Fluid, blood, or pus coming from the puncture site. Warmth coming from the puncture site. Get help right away if: You have a lot of bleeding from the puncture site. Summary After the procedure, it is common to have soreness, bruising, or mild pain at the puncture site. Check your puncture site every day for signs of infection, such as redness, swelling, or pain. Get help right away if you have severe bleeding from your puncture  site. This information is not intended to replace advice given to you by your health care provider. Make sure you discuss any questions you have with your healthcare provider. Document Revised: 05/01/2020 Document Reviewed: 05/01/2020 Elsevier Patient Education  2022 Else[employer] Inc[employer]   Moderate Conscious Sedation, Adult, Care After This sheet gives you information about how to care for yourself after your procedure. Your health care provider may also give you more specific instructions. If you have problems or questions, contact your health care provider. What can I expect after the procedure? After the procedure, it is common to have: Sleepiness for several hours. Impaired judgment for several hours. Difficulty with balance. Vomiting if you eat too soon. Follow these instructions at home: For the time period you were told by your health care provider: Rest. Do not participate in activities where you could fall or become injured. Do not drive or use machinery. Do not drink alcohol. Do not take sleeping pills or medicines that cause drowsiness. Do not make important decisions or sign legal documents. Do not take care of children on your own.      Eating and drinking Follow the diet recommended by your health care provider. Drink enough fluid to keep your urine pale yellow. If you vomit: Drink water, juice, or soup when you can drink without vomiting. Make sure you have little or no nausea before eating solid foods.   General instructions Take over-the-counter and prescription medicines only as told by your health care provider. Have a responsible adult stay with you for the time you are told.   It is important to have someone help care for you until you are awake and alert. Do not smoke. Keep all follow-up visits as told by your health care provider. This is important. Contact a health care provider if: You are still sleepy or having trouble with balance after 24 hours. You feel  light-headed. You keep feeling nauseous or you keep vomiting. You develop a rash. You have a fever. You have redness or swelling around the IV site. Get help right away if: You have trouble breathing. You have new-onset confusion at home. Summary After the procedure, it is common to feel sleepy, have impaired judgment, or feel nauseous if you eat too soon. Rest after you get home. Know the things you should not do after the procedure. Follow the diet recommended by your health care provider and drink enough fluid to keep your urine pale yellow. Get help right away if you have trouble breathing or new-onset confusion at home. This information is not intended to replace advice given to you by your health care provider. Make sure you discuss any questions you have with your health care provider. Document Revised: 02/29/2020 Document Reviewed: 09/27/2019 Elsevier Patient Education  2021 Elsevier Inc.     

## 2022-09-30 NOTE — Sedation Documentation (Signed)
Needle removed and gel foam inserted into tract upon removal

## 2022-09-30 NOTE — Sedation Documentation (Signed)
Patient brought to [place] suite via Biomedical scientist by Regulatory affairs officer. Patient to remain on stretcher for procedure. Monitors and oxygen attached to patient.

## 2022-09-30 NOTE — Sedation Documentation (Signed)
Bandage applied.

## 2022-10-01 ENCOUNTER — Encounter (HOSPITAL_COMMUNITY)
Admission: RE | Admit: 2022-10-01 | Discharge: 2022-10-01 | Disposition: A | Discharge status: Home / Self Care | Payer: Commercial Managed Care - HMO | Source: Ambulatory Visit | Attending: Hematology | Admitting: Hematology

## 2022-10-01 ENCOUNTER — Telehealth: Payer: Self-pay

## 2022-10-01 ENCOUNTER — Other Ambulatory Visit: Payer: Self-pay | Admitting: Hematology

## 2022-10-01 ENCOUNTER — Other Ambulatory Visit: Payer: Self-pay

## 2022-10-01 ENCOUNTER — Ambulatory Visit: Payer: Commercial Managed Care - HMO | Admitting: Hematology

## 2022-10-01 DIAGNOSIS — C23 Malignant neoplasm of gallbladder: Secondary | ICD-10-CM | POA: Diagnosis present

## 2022-10-01 LAB — GLUCOSE, CAPILLARY: Glucose-Capillary: 114 mg/dL — ABNORMAL HIGH (ref 70–99)

## 2022-10-01 MED ORDER — HYDROCODONE-ACETAMINOPHEN 5-325 MG PO TABS: 1.0000 | ORAL_TABLET | Freq: Four times a day (QID) | ORAL | 0 refills | Status: DC | PRN

## 2022-10-01 MED ORDER — FLUDEOXYGLUCOSE F - 18 (FDG) INJECTION
8.3000 | Freq: Once | INTRAVENOUS | Status: AC
  Administered 2022-10-01: 8.27 via INTRAVENOUS

## 2022-10-01 NOTE — Telephone Encounter (Signed)
Pt's son called requesting refill on Norco/Vicodin.  Pt would like refill sent to [facility] her preferred pharmacy on file. Notified Dr. Burr Medico.

## 2022-10-04 LAB — SURGICAL PATHOLOGY

## 2022-10-05 ENCOUNTER — Inpatient Hospital Stay: Payer: Commercial Managed Care - HMO | Admitting: Dietician

## 2022-10-05 ENCOUNTER — Other Ambulatory Visit: Payer: Self-pay

## 2022-10-05 ENCOUNTER — Inpatient Hospital Stay (HOSPITAL_BASED_OUTPATIENT_CLINIC_OR_DEPARTMENT_OTHER): Payer: Commercial Managed Care - HMO | Admitting: Hematology

## 2022-10-05 VITALS — BP 128/61 | HR 78 | Temp 98.3°F | Resp 19 | Ht 64.0 in | Wt 163.5 lb

## 2022-10-05 DIAGNOSIS — C23 Malignant neoplasm of gallbladder: Secondary | ICD-10-CM | POA: Diagnosis not present

## 2022-10-05 MED ORDER — ONDANSETRON HCL 8 MG PO TABS: 8.0000 mg | ORAL_TABLET | Freq: Three times a day (TID) | ORAL | 1 refills | Status: DC | PRN

## 2022-10-05 MED ORDER — LIDOCAINE-PRILOCAINE 2.5-2.5 % EX CREA: TOPICAL_CREAM | CUTANEOUS | 3 refills | Status: DC

## 2022-10-05 MED ORDER — PROCHLORPERAZINE MALEATE 10 MG PO TABS: 10.0000 mg | ORAL_TABLET | Freq: Four times a day (QID) | ORAL | 1 refills | Status: DC | PRN

## 2022-10-05 MED ORDER — HYDROCODONE-ACETAMINOPHEN 5-325 MG PO TABS: 1.0000 | ORAL_TABLET | Freq: Four times a day (QID) | ORAL | 0 refills | Status: DC | PRN

## 2022-10-05 NOTE — Progress Notes (Signed)
START OFF PATHWAY REGIMEN - Other ° ° °[id number]:Cisplatin IV D1,8 + Durvalumab 1,500 mg IV D1 + Gemcitabine IV D1,8 q21 Days for up to 8 Cycles Followed by Durvalumab 1,500 mg IV D1 q28 Days: °  Cycles 1 through up to 8: A cycle is every 21 days: °    Durvalumab  °    Gemcitabine  °    Cisplatin  °  Cycles 9 and beyond: A cycle is every 28 days: °    Durvalumab  ° °**Always confirm dose/schedule in your pharmacy ordering system** ° °Patient Characteristics: °Intent of Therapy: °Non-Curative / Palliative Intent, Discussed with Patient °

## 2022-10-05 NOTE — Progress Notes (Signed)
[place]   [phone]) [phone] Fax:([phone]) [phone]   Clinic Follow up Note   Patient Care Team: Geradine Girt, DO as PCP - General (Internal Medicine) Dwan Bolt, MD as Consulting Physician (General Surgery) Truitt Merle, MD as Consulting Physician (Hematology) [place] as Consulting Physician (Radiology)  Date of Service:  10/05/2022  CHIEF COMPLAINT: f/u of gallbladder cancer  CURRENT THERAPY:  Surveillance  ASSESSMENT:  Michele Arnold is a 61 y.o. 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 Zenia Resides on 09/04/21.  -Adipost adjuvant chemotherapy with Xeloda for 6 months.  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 -I reviewed both these results with the pt and her family today. I discussed the likelihood that this is not curable disease but is very treatable. I explained that I plan to reach out to her surgeon, Dr. Zenia Resides, to discuss potential surgical removal. However, I anticipate she will need chemotherapy, in which case I recommend port placement. I discussed chemotherapy with her today. I plan to start her on first line gemcitabine/cisplatin with durvalumab next week --Chemotherapy consent: Side effects including but does not not limited to, fatigue, nausea, vomiting, diarrhea, hair loss, neuropathy, fluid retention, renal and kidney dysfunction, neutropenic fever, needed for blood transfusion, bleeding, and autoimmune related disorders from durvalumab, such as pneumonitis, colitis, pancreatitis, hepatitis, thyroid and other endocrine disorders, etc, were discussed with patient in great detail. She agrees to proceed. -We discussed the goal of chemo is  palliative, to control her disease    PLAN: -I refilled norco. We reviewed constipation management. I recommend laxatives and encouraged her to start with once a day and increase as needed. -will schedule chemo class -first cycle chemotherapy cisplatin, gemcitabine and to further Mab next week -Will review her case in our GI conference tomorrow. -port placement with Dr. Zenia Resides  Addendum Her case was discussed in tumor board today, Dr. Zenia Resides may offer upfront surgery, will hold on chemo for now.  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 Cholelithiasis with distended gallbladder. No CT evidence of acute cholecystitis. Consider further evaluation with right upper quadrant ultrasound as clinically indicated. 2. Punctate nonobstructive left nephrolithiasis. 3. Aortic Atherosclerosis (ICD10-I70.0).   07/21/2021 Imaging   ABD [place] RUQ IMPRESSION: 1. Cholelithiasis without evidence of acute cholecystitis. 2. Mild hepatic steatosis.     07/21/2021 Imaging   MRA/MRCP IMPRESSION: 1. No biliary ductal dilation. No choledocholithiasis. 2. Mild hepatic steatosis. 3. Cholelithiasis with dilation of the gallbladder and trace pericholecystic fluid. No gallbladder wall thickening or abnormal wall enhancement. Findings which are equivocal for acute cholecystitis. Consider further evaluation with nuclear medicine HIDA scan to assess cystic duct patency if clinically indicated.   07/22/2021 Imaging   HIDA SCAN IMPRESSION: Scintigraphic findings most consistent with acute cholecystitis.   07/23/2021 Surgery   Preop Dx:        Acute cholecystitis Postop Dx:      Acute cholecystitis with 1 cm stone impacted in the infundibulum Procedure:      Xi robotic cholecystectomy with ICG By Dr. Johnathan Hausen   07/23/2021 Pathology Results   FINAL MICROSCOPIC DIAGNOSIS:  A. GALLBLADDER, CHOLECYSTECTOMY:  -  Invasive  poorly differentiated adenocarcinoma, 4.3 cm, involving  gallbladder neck  - Carcinoma invades perimuscular soft tissue  - Cystic duct margin is focally positive for carcinoma  - Focally suspicious for lymphovascular invasion   Procedure: Cholecystectomy  Tumor Site: Gallbladder neck  Tumor Size: 4.3 cm  Histologic Type: Adenocarcinoma  Histologic Grade: G3: Poorly differentiated  Tumor Extension: Tumor invades perimuscular connective tissue on the peritoneal side without serosal involvement  Margins:       Margin Status for Invasive Carcinoma: Cystic duct margin is positive for carcinoma  Regional Lymph Nodes: Not applicable (no lymph nodes submitted or found)  Distant Metastasis:       Distant Site(s) Involved: Not applicable  Pathologic Stage Classification (pTNM, AJCC 8th Edition): pT2a, pN not assigned    08/20/2021 Imaging   CT chest IMPRESSION: Negative. No CT evidence for acute intrathoracic abnormality. Negative for pulmonary nodule or evidence for metastatic disease to the chest.   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 Michaelle Birks Staging laparoscopy Exploratory laparotomy with intraoperative cholangiogram Excision of new cystic duct margin Partial central hepatectomy (resection of gallbladder fossa - segments 4b and 5) Portal lymph node dissection   09/04/2021 Pathology Results   FINAL MICROSCOPIC DIAGNOSIS:  A. CYSTIC DUCT, NEW MARGIN, EXCISION:  -  No adenocarcinoma identified  B. LYMPH NODE, PORTAL, EXCISION:  -  Nodular fat necrosis and fibrosis  -  No nodal tissue identified  C. GALLBLADDER, FOSSA, EXCISION:  -  Benign liver with periductular chronic inflammation and  macrovesicular steatosis  -  No adenocarcinoma identified  D. LYMPH NODE, STATION EIGHT, EXCISION:  -  No adenocarcinoma identified in one lymph node  (0/1)    09/04/2021 Cancer Staging   Staging form: Gallbladder, AJCC 8th Edition - Clinical stage from 09/04/2021: Stage IIA (cT2a, cN0, cM0) - Signed by Alla Feeling, NP on 09/21/2021 Stage prefix: Initial diagnosis Total positive nodes: 0 Histologic grade (G): G3 Histologic grading system: 3 grade system Histologic sub-type: Adenocarcinoma   09/28/2021 - 09/28/2021 Chemotherapy   Patient is on Treatment Plan : BREAST Capecitabine q21d     09/30/2022 Relapse/Recurrence   FINAL MICROSCOPIC DIAGNOSIS:  A. 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  It is composed of solid sheets and nests of atypical cells with variably enlarged round to oval irregular nuclei and a variable amount of eosinophilic focally vacuolated cytoplasm, and these sheets and nests are admixed with infiltrating irregular angulated glands lined by an atypical columnar epithelium composed of similar-appearing cells.  Glandular lumens contain mucin.  Focally these epithelia are contiguous and are present within the same sheet and/or gland. Five immunohistochemical stains performed with adequate control. The tumor is positive for cytokeratin 7 and the solid sheets and nests are positive for the squamous marker p40.  The tumor is negative for cytokeratin 20.  The tumor is also negative for the GI marker CDX2.  The tumor is negative for the hepatic marker arginase.  The prior cholecystectomy specimen ([id number]) is reviewed and this carcinoma shows identical cytohistomorphology as the previously diagnosed gallbladder adenocarcinoma.    10/01/2022 PET scan   IMPRESSION: 1. Progressive soft tissue mass involving the right lower quadrant ventral abdominal wall is intensely FDG avid compatible with  recurrent tumor. 2. No  signs of FDG avid nodal or solid organ metastasis. 3. Multiple fat containing ventral abdominal wall hernias. 4.  Aortic Atherosclerosis (ICD10-I70.0).   10/12/2022 -  Chemotherapy   Patient is on Treatment Plan : BILIARY TRACT Cisplatin + Gemcitabine D1,8 q21d     10/12/2022 -  Chemotherapy   Patient is on Treatment Plan : BLADDER Durvalumab (10) q14d        INTERVAL HISTORY:  Michele Arnold is here for a follow up of gallbladder cancer. She was last seen by me on 09/17/22. She presents to the clinic accompanied by her husband and her son. She tells me the biopsy was painful. She endorses taking norco and explains it lasts about 3-4 hours. She described her pain as 7/10 without the medication. She also notes a fullness feeling/pain in her ear that has improved. She denies any hearing loss.   All other systems were reviewed with the patient and are negative.  MEDICAL HISTORY:  Past Medical History:  Diagnosis Date   Cancer [facility])    Hypertension     SURGICAL HISTORY: Past Surgical History:  Procedure Laterality Date   CHOLECYSTECTOMY  07/23/2021   INTRAOPERATIVE CHOLANGIOGRAM N/A 09/04/2021   Procedure: INTRAOPERATIVE CHOLANGIOGRAM;  Surgeon: Dwan Bolt, MD;  Location: [facility];  Service: General;  Laterality: N/A;   LAPAROSCOPY N/A 09/04/2021   Procedure: STAGING LAPAROSCOPY;  Surgeon: Dwan Bolt, MD;  Location: [place];  Service: General;  Laterality: N/A;   LYMPH NODE DISSECTION N/A 09/04/2021   Procedure: PORTAL LYMPH NODE DISSECTION;  Surgeon: Dwan Bolt, MD;  Location: [place];  Service: General;  Laterality: N/A;   OPEN HEPATECTOMY  N/A 09/04/2021   Procedure: OPEN PARTIAL CENTRAL HEPATECTOMY;  Surgeon: Dwan Bolt, MD;  Location: [place];  Service: General;  Laterality: N/A;    I have reviewed the social history and family history with the patient and they are unchanged from previous note.  ALLERGIES:  has No Known Allergies.  MEDICATIONS:  Current  Outpatient Medications  Medication Sig Dispense Refill   acetaminophen (TYLENOL) 325 MG tablet Take 2 tablets (650 mg total) by mouth every 6 (six) hours as needed for mild pain or fever.     atorvastatin (LIPITOR) 40 MG tablet Take 40 mg by mouth daily.     hydrochlorothiazide (MICROZIDE) 12.5 MG capsule Take 12.5 mg by mouth daily.     HYDROcodone-acetaminophen (NORCO/VICODIN) 5-325 MG tablet Take 1 tablet by mouth every 6 (six) hours as needed for severe pain. 90 tablet 0   lidocaine-prilocaine (EMLA) cream Apply to affected area once 30 g 3   lisinopril (ZESTRIL) 10 MG tablet Take 1 tablet (10 mg total) by mouth daily. 30 tablet 0   ondansetron (ZOFRAN) 8 MG tablet Take 1 tablet (8 mg total) by mouth every 8 (eight) hours as needed for nausea or vomiting. 30 tablet 1   prochlorperazine (COMPAZINE) 10 MG tablet Take 1 tablet (10 mg total) by mouth every 6 (six) hours as needed for nausea or vomiting. 30 tablet 1   urea (CARMOL) 10 % cream Apply topically to hands and feet 2 to 3 times daily as needed 71 g 2   No current facility-administered medications for this visit.    PHYSICAL EXAMINATION: ECOG PERFORMANCE STATUS: 1 - Symptomatic but completely ambulatory  Vitals:   10/05/22 1204  BP: 128/61  Pulse: 78  Resp: 19  Temp: 98.3 F (36.8 C)  SpO2: 100%   Wt Readings  from Last 3 Encounters:  10/05/22 163 lb 8 oz (74.2 kg)  09/30/22 166 lb 8 oz (75.5 kg)  09/17/22 166 lb 8 oz (75.5 kg)     GENERAL:alert, no distress and comfortable SKIN: skin color, texture, turgor are normal, no rashes or significant lesions EYES: normal, Conjunctiva are pink and non-injected, sclera clear EARS: no erythema or pus  NEURO: alert & oriented x 3 with fluent speech, no focal motor/sensory deficits  LABORATORY DATA:  I have reviewed the data as listed    Latest Ref Rng & Units 09/30/2022   11:26 AM 09/15/2022    8:35 PM 09/06/2022    9:56 AM  CBC  WBC 4.0 - 10.5 K/uL 10.6  8.7  8.4    Hemoglobin 12.0 - 15.0 g/dL 12.4  12.3  13.5   Hematocrit 36.0 - 46.0 % 37.6  37.4  40.4   Platelets 150 - 400 K/uL 505  451  413         Latest Ref Rng & Units 09/15/2022    8:35 PM 09/06/2022    9:56 AM 06/07/2022    2:59 PM  CMP  Glucose 70 - 99 mg/dL 135  107  74   BUN 6 - 20 mg/dL '16  15  17   '$ Creatinine 0.44 - 1.00 mg/dL 0.62  0.67  0.78   Sodium 135 - 145 mmol/L 137  138  142   Potassium 3.5 - 5.1 mmol/L 3.8  3.8  4.0   Chloride 98 - 111 mmol/L 105  106  107   CO2 22 - 32 mmol/L '24  25  30   '$ Calcium 8.9 - 10.3 mg/dL 8.9  9.2  9.2   Total Protein 6.5 - 8.1 g/dL 8.0  8.2  7.5   Total Bilirubin 0.3 - 1.2 mg/dL 1.0  0.8  1.3   Alkaline Phos 38 - 126 U/L 93  101  84   AST 15 - 41 U/L '19  15  26   '$ ALT 0 - 44 U/L '14  13  25       '$ RADIOGRAPHIC STUDIES: I have personally reviewed the radiological images as listed and agreed with the findings in the report. No results found.    Orders Placed This Encounter  Procedures   CBC with Differential ([place] Only)    Standing Status:   Future    Standing Expiration Date:   10/13/2023   CMP ([place] only)    Standing Status:   Future    Standing Expiration Date:   10/13/2023   Magnesium    Standing Status:   Future    Standing Expiration Date:   10/13/2023   CBC with Differential ([facility] Only)    Standing Status:   Future    Standing Expiration Date:   10/20/2023   CMP ([place] only)    Standing Status:   Future    Standing Expiration Date:   10/20/2023   Magnesium    Standing Status:   Future    Standing Expiration Date:   10/20/2023   CBC with Differential ([facility] Only)    Standing Status:   Future    Standing Expiration Date:   10/13/2023   CMP ([place] only)    Standing Status:   Future    Standing Expiration Date:   10/13/2023   T4    Standing Status:   Future    Standing Expiration Date:   10/13/2023   TSH  Standing Status:   Future    Standing Expiration Date:    10/13/2023   All questions were answered. The patient knows to call the clinic with any problems, questions or concerns. No barriers to learning was detected. The total time spent in the appointment was 40 minutes.     Truitt Merle, MD 10/05/2022   I, Wilburn Mylar, am acting as scribe for Truitt Merle, MD.   I have reviewed the above documentation for accuracy and completeness, and I agree with the above.

## 2022-10-05 NOTE — Assessment & Plan Note (Signed)
-  Diagnosed on 07/23/21, which was incidentally found on cholecystectomy, status post oncological staging surgery by Dr. Zenia Resides on 09/04/21.  -Adipost adjuvant chemotherapy with Xeloda for 6 months.  -developed abdominal wall recurrence in 09/2022

## 2022-10-06 ENCOUNTER — Other Ambulatory Visit: Payer: Self-pay

## 2022-10-06 NOTE — Progress Notes (Signed)
The proposed treatment discussed in conference is for discussion purpose only and is not a binding recommendation.  The patients have not been physically examined, or presented with their treatment options.  Therefore, final treatment plans cannot be decided.  

## 2022-10-06 NOTE — Progress Notes (Signed)
I called Michele Arnold via [employer] Number [phone].  We called twice and the call would not go through.  I called her son, Danne Baxter I relayed the discussion during GI Conference this morning.  I told him we are postponing chemotherapy.  Dr Zenia Resides wants to see her for possible surgery. I gave him the number to [facility] and told him to call if he does not hear from them by Monday.  All questions were answered.  he verbalized understanding.

## 2022-10-06 NOTE — Addendum Note (Signed)
Addended by: Truitt Merle on: 10/06/2022 08:28 AM   Modules accepted: Orders

## 2022-10-12 ENCOUNTER — Ambulatory Visit: Payer: Commercial Managed Care - HMO | Admitting: Hematology

## 2022-10-12 ENCOUNTER — Ambulatory Visit: Payer: Commercial Managed Care - HMO

## 2022-10-12 ENCOUNTER — Telehealth: Payer: Self-pay | Admitting: Physician Assistant

## 2022-10-12 ENCOUNTER — Other Ambulatory Visit: Payer: Self-pay

## 2022-10-12 ENCOUNTER — Other Ambulatory Visit: Payer: Commercial Managed Care - HMO

## 2022-10-13 ENCOUNTER — Other Ambulatory Visit: Payer: Self-pay | Admitting: Hematology

## 2022-10-13 ENCOUNTER — Telehealth: Payer: Self-pay | Admitting: Hematology

## 2022-10-13 ENCOUNTER — Ambulatory Visit: Payer: Self-pay | Admitting: Surgery

## 2022-10-13 DIAGNOSIS — C23 Malignant neoplasm of gallbladder: Secondary | ICD-10-CM

## 2022-10-13 MED ORDER — ONDANSETRON HCL 8 MG PO TABS: 8.0000 mg | ORAL_TABLET | Freq: Three times a day (TID) | ORAL | 1 refills | Status: DC | PRN

## 2022-10-13 MED ORDER — PROCHLORPERAZINE MALEATE 10 MG PO TABS: 10.0000 mg | ORAL_TABLET | Freq: Four times a day (QID) | ORAL | 1 refills | Status: DC | PRN

## 2022-10-13 MED ORDER — LIDOCAINE-PRILOCAINE 2.5-2.5 % EX CREA: TOPICAL_CREAM | CUTANEOUS | 3 refills | Status: DC

## 2022-10-13 NOTE — Telephone Encounter (Signed)
Used interpreter line to contact patient about her upcoming appointments. Patient notified.

## 2022-10-14 ENCOUNTER — Other Ambulatory Visit: Payer: Self-pay

## 2022-10-14 NOTE — Anesthesia Preprocedure Evaluation (Addendum)
Anesthesia Evaluation  Patient identified by MRN, date of birth, ID band Patient awake    Reviewed: Allergy & Precautions, NPO status , Patient's Chart, lab work & pertinent test results  Airway Mallampati: II  TM Distance: >3 FB Neck ROM: Full    Dental no notable dental hx. (+) Teeth Intact, Dental Advisory Given   Pulmonary neg pulmonary ROS   Pulmonary exam normal breath sounds clear to auscultation       Cardiovascular hypertension, Normal cardiovascular exam Rhythm:Regular Rate:Normal     Neuro/Psych negative neurological ROS  negative psych ROS   GI/Hepatic negative GI ROS, Neg liver ROS,,,  Endo/Other  negative endocrine ROS    Renal/GU negative Renal ROS  negative genitourinary   Musculoskeletal negative musculoskeletal ROS (+)    Abdominal   Peds  Hematology negative hematology ROS (+)   Anesthesia Other Findings Gallbladder CA with abdominal wall met  Reproductive/Obstetrics                             Anesthesia Physical Anesthesia Plan  ASA: 2  Anesthesia Plan: General   Post-op Pain Management: Tylenol PO (pre-op)*   Induction: Intravenous  PONV Risk Score and Plan: 3 and Midazolam, Dexamethasone and Ondansetron  Airway Management Planned: LMA  Additional Equipment:   Intra-op Plan:   Post-operative Plan: Extubation in OR  Informed Consent: I have reviewed the patients History and Physical, chart, labs and discussed the procedure including the risks, benefits and alternatives for the proposed anesthesia with the patient or authorized representative who has indicated his/her understanding and acceptance.     Dental advisory given  Plan Discussed with: CRNA  Anesthesia Plan Comments:        Anesthesia Quick Evaluation

## 2022-10-14 NOTE — Progress Notes (Signed)
Called pt for pre-op call via [employer], [place] ID # [phone]. When telling pt why I was calling, she told the interpreter that she was not scheduled for surgery tomorrow, she was having lab work done. She said that she had to take a class about the port-A-cath before it was inserted. I told her that I would call Dr. Zenia Resides and find out from her. I then called Dr. Zenia Resides and told her all of this and she stated that pt had to know because they couldn't schedule it without her knowledge. She suggested that I call her children and talk with them. The only child we have listed to call is her son Danne Baxter. He does speak [place]. I spoke with him and he wasn't aware of the surgery either but would make sure she got here tomorrow. I gave him time of arrival, where to come, not to eat food after midnight and clear liquids until 7:30 AM. Asked him to have pt shower in the AM Dr. Zenia Resides did say she would call the patient in the AM.

## 2022-10-15 ENCOUNTER — Ambulatory Visit (HOSPITAL_COMMUNITY): Payer: Commercial Managed Care - HMO | Admitting: Anesthesiology

## 2022-10-15 ENCOUNTER — Ambulatory Visit (HOSPITAL_COMMUNITY): Payer: Commercial Managed Care - HMO

## 2022-10-15 ENCOUNTER — Other Ambulatory Visit: Payer: Self-pay

## 2022-10-15 ENCOUNTER — Encounter (HOSPITAL_COMMUNITY): Payer: Self-pay | Admitting: Surgery

## 2022-10-15 ENCOUNTER — Telehealth: Payer: Self-pay | Admitting: Surgery

## 2022-10-15 ENCOUNTER — Encounter (HOSPITAL_COMMUNITY)
Admission: RE | Disposition: A | Discharge status: Home / Self Care | Payer: Self-pay | Source: Home / Self Care | Attending: Surgery

## 2022-10-15 ENCOUNTER — Ambulatory Visit (HOSPITAL_BASED_OUTPATIENT_CLINIC_OR_DEPARTMENT_OTHER): Payer: Commercial Managed Care - HMO | Admitting: Anesthesiology

## 2022-10-15 ENCOUNTER — Inpatient Hospital Stay: Payer: Commercial Managed Care - HMO

## 2022-10-15 ENCOUNTER — Ambulatory Visit (HOSPITAL_COMMUNITY)
Admission: RE | Admit: 2022-10-15 | Discharge: 2022-10-15 | Disposition: A | Discharge status: Home / Self Care | Payer: Commercial Managed Care - HMO | Attending: Surgery | Admitting: Surgery

## 2022-10-15 DIAGNOSIS — C23 Malignant neoplasm of gallbladder: Secondary | ICD-10-CM | POA: Diagnosis not present

## 2022-10-15 DIAGNOSIS — Z8509 Personal history of malignant neoplasm of other digestive organs: Secondary | ICD-10-CM | POA: Diagnosis not present

## 2022-10-15 DIAGNOSIS — Z9221 Personal history of antineoplastic chemotherapy: Secondary | ICD-10-CM | POA: Insufficient documentation

## 2022-10-15 DIAGNOSIS — C7989 Secondary malignant neoplasm of other specified sites: Secondary | ICD-10-CM | POA: Diagnosis present

## 2022-10-15 DIAGNOSIS — I10 Essential (primary) hypertension: Secondary | ICD-10-CM | POA: Insufficient documentation

## 2022-10-15 HISTORY — PX: PORTACATH PLACEMENT: SHX2246

## 2022-10-15 SURGERY — INSERTION, TUNNELED CENTRAL VENOUS DEVICE, WITH PORT
Anesthesia: General | Site: Chest | Laterality: Right

## 2022-10-15 MED ORDER — CEFAZOLIN SODIUM-DEXTROSE 2-4 GM/100ML-% IV SOLN
2.0000 g | INTRAVENOUS | Status: AC
  Administered 2022-10-15: 2 g via INTRAVENOUS
  Filled 2022-10-15: qty 100

## 2022-10-15 MED ORDER — HEPARIN SOD (PORK) LOCK FLUSH 100 UNIT/ML IV SOLN
INTRAVENOUS | Status: AC
  Filled 2022-10-15: qty 5

## 2022-10-15 MED ORDER — PHENYLEPHRINE 80 MCG/ML (10ML) SYRINGE FOR IV PUSH (FOR BLOOD PRESSURE SUPPORT)
PREFILLED_SYRINGE | INTRAVENOUS | Status: AC
  Filled 2022-10-15: qty 10

## 2022-10-15 MED ORDER — HEPARIN 6000 UNIT IRRIGATION SOLUTION
Status: DC | PRN
  Administered 2022-10-15: 1

## 2022-10-15 MED ORDER — CEFAZOLIN SODIUM 1 G IJ SOLR
INTRAMUSCULAR | Status: AC
  Filled 2022-10-15: qty 10

## 2022-10-15 MED ORDER — LIDOCAINE 2% (20 MG/ML) 5 ML SYRINGE
INTRAMUSCULAR | Status: AC
  Filled 2022-10-15: qty 5

## 2022-10-15 MED ORDER — HEPARIN 6000 UNIT IRRIGATION SOLUTION
Status: AC
  Filled 2022-10-15: qty 500

## 2022-10-15 MED ORDER — HEPARIN SOD (PORK) LOCK FLUSH 100 UNIT/ML IV SOLN
INTRAVENOUS | Status: DC | PRN
  Administered 2022-10-15: 500 [IU] via INTRAVENOUS

## 2022-10-15 MED ORDER — ONDANSETRON HCL 4 MG/2ML IJ SOLN
INTRAMUSCULAR | Status: AC
  Filled 2022-10-15: qty 2

## 2022-10-15 MED ORDER — ROCURONIUM BROMIDE 10 MG/ML (PF) SYRINGE
PREFILLED_SYRINGE | INTRAVENOUS | Status: AC
  Filled 2022-10-15: qty 20

## 2022-10-15 MED ORDER — PROPOFOL 10 MG/ML IV BOLUS
INTRAVENOUS | Status: DC | PRN
  Administered 2022-10-15: 150 mg via INTRAVENOUS

## 2022-10-15 MED ORDER — BUPIVACAINE-EPINEPHRINE (PF) 0.25% -1:200000 IJ SOLN
INTRAMUSCULAR | Status: AC
  Filled 2022-10-15: qty 30

## 2022-10-15 MED ORDER — FENTANYL CITRATE (PF) 250 MCG/5ML IJ SOLN
INTRAMUSCULAR | Status: AC
  Filled 2022-10-15: qty 5

## 2022-10-15 MED ORDER — 0.9 % SODIUM CHLORIDE (POUR BTL) OPTIME
TOPICAL | Status: DC | PRN
  Administered 2022-10-15: 1000 mL

## 2022-10-15 MED ORDER — ONDANSETRON HCL 4 MG/2ML IJ SOLN
INTRAMUSCULAR | Status: DC | PRN
  Administered 2022-10-15: 4 mg via INTRAVENOUS

## 2022-10-15 MED ORDER — FENTANYL CITRATE (PF) 100 MCG/2ML IJ SOLN: 25.0000 ug | INTRAMUSCULAR | Status: DC | PRN

## 2022-10-15 MED ORDER — LIDOCAINE 2% (20 MG/ML) 5 ML SYRINGE
INTRAMUSCULAR | Status: DC | PRN
  Administered 2022-10-15: 60 mg via INTRAVENOUS

## 2022-10-15 MED ORDER — MIDAZOLAM HCL 2 MG/2ML IJ SOLN
INTRAMUSCULAR | Status: DC | PRN
  Administered 2022-10-15: 2 mg via INTRAVENOUS

## 2022-10-15 MED ORDER — MIDAZOLAM HCL 2 MG/2ML IJ SOLN
INTRAMUSCULAR | Status: AC
  Filled 2022-10-15: qty 2

## 2022-10-15 MED ORDER — PHENYLEPHRINE 80 MCG/ML (10ML) SYRINGE FOR IV PUSH (FOR BLOOD PRESSURE SUPPORT)
PREFILLED_SYRINGE | INTRAVENOUS | Status: DC | PRN
  Administered 2022-10-15 (×2): 80 ug via INTRAVENOUS

## 2022-10-15 MED ORDER — ACETAMINOPHEN 325 MG PO TABS
650.0000 mg | ORAL_TABLET | Freq: Once | ORAL | Status: AC
  Administered 2022-10-15: 650 mg via ORAL
  Filled 2022-10-15: qty 2

## 2022-10-15 MED ORDER — DEXAMETHASONE SODIUM PHOSPHATE 10 MG/ML IJ SOLN
INTRAMUSCULAR | Status: DC | PRN
  Administered 2022-10-15: 10 mg via INTRAVENOUS

## 2022-10-15 MED ORDER — BUPIVACAINE-EPINEPHRINE 0.25% -1:200000 IJ SOLN
INTRAMUSCULAR | Status: DC | PRN
  Administered 2022-10-15: 10 mL

## 2022-10-15 MED ORDER — ACETAMINOPHEN 500 MG PO TABS
1000.0000 mg | ORAL_TABLET | Freq: Once | ORAL | Status: DC
  Filled 2022-10-15: qty 2

## 2022-10-15 MED ORDER — FENTANYL CITRATE (PF) 250 MCG/5ML IJ SOLN
INTRAMUSCULAR | Status: DC | PRN
  Administered 2022-10-15: 50 ug via INTRAVENOUS
  Administered 2022-10-15: 25 ug via INTRAVENOUS

## 2022-10-15 MED ORDER — DEXAMETHASONE SODIUM PHOSPHATE 10 MG/ML IJ SOLN
INTRAMUSCULAR | Status: AC
  Filled 2022-10-15: qty 1

## 2022-10-15 MED ORDER — LACTATED RINGERS IV SOLN: INTRAVENOUS | Status: DC

## 2022-10-15 MED ORDER — CHLORHEXIDINE GLUCONATE 0.12 % MT SOLN
15.0000 mL | Freq: Once | OROMUCOSAL | Status: AC
  Administered 2022-10-15: 15 mL via OROMUCOSAL
  Filled 2022-10-15: qty 15

## 2022-10-15 MED ORDER — ORAL CARE MOUTH RINSE: 15.0000 mL | Freq: Once | OROMUCOSAL | Status: AC

## 2022-10-15 MED ORDER — PROPOFOL 10 MG/ML IV BOLUS
INTRAVENOUS | Status: AC
  Filled 2022-10-15: qty 20

## 2022-10-15 SURGICAL SUPPLY — 39 items
ADH SKN CLS APL DERMABOND .7 (GAUZE/BANDAGES/DRESSINGS) ×1
APL PRP STRL LF DISP 70% ISPRP (MISCELLANEOUS) ×1
BAG COUNTER SPONGE SURGICOUNT (BAG) ×1 IMPLANT
BAG DECANTER FOR FLEXI CONT (MISCELLANEOUS) ×1 IMPLANT
BAG SPNG CNTER NS LX DISP (BAG) ×1
CHLORAPREP W/TINT 26 (MISCELLANEOUS) ×1 IMPLANT
COVER SURGICAL LIGHT HANDLE (MISCELLANEOUS) ×1 IMPLANT
COVER TRANSDUCER ULTRASND GEL (DISPOSABLE) IMPLANT
DERMABOND ADVANCED .7 DNX12 (GAUZE/BANDAGES/DRESSINGS) ×1 IMPLANT
DRAPE C-ARM 42X120 X-RAY (DRAPES) ×1 IMPLANT
DRAPE LAPAROSCOPIC ABDOMINAL (DRAPES) ×1 IMPLANT
ELECT CAUTERY BLADE 6.4 (BLADE) ×1 IMPLANT
ELECT REM PT RETURN 9FT ADLT (ELECTROSURGICAL) ×1
ELECTRODE REM PT RTRN 9FT ADLT (ELECTROSURGICAL) ×1 IMPLANT
GEL ULTRASOUND 20GR AQUASONIC (MISCELLANEOUS) IMPLANT
GLOVE BIOGEL PI IND STRL 6 (GLOVE) ×1 IMPLANT
GLOVE SURG SYN 5.5 (GLOVE) ×1 IMPLANT
GLOVE SURG SYN 5.5 PF PI (GLOVE) ×1 IMPLANT
GOWN STRL REUS W/ TWL LRG LVL3 (GOWN DISPOSABLE) ×2 IMPLANT
GOWN STRL REUS W/TWL LRG LVL3 (GOWN DISPOSABLE) ×2
INTRODUCER COOK 11FR (CATHETERS) IMPLANT
KIT BASIN OR (CUSTOM PROCEDURE TRAY) ×1 IMPLANT
KIT PORT POWER 8FR ISP CVUE (Port) IMPLANT
KIT TURNOVER KIT B (KITS) ×1 IMPLANT
NS IRRIG 1000ML POUR BTL (IV SOLUTION) ×1 IMPLANT
PAD ARMBOARD 7.5X6 YLW CONV (MISCELLANEOUS) ×1 IMPLANT
PENCIL BUTTON HOLSTER BLD 10FT (ELECTRODE) ×1 IMPLANT
POSITIONER HEAD DONUT 9IN (MISCELLANEOUS) ×1 IMPLANT
SET INTRODUCER 12FR PACEMAKER (INTRODUCER) IMPLANT
SET SHEATH INTRODUCER 10FR (MISCELLANEOUS) IMPLANT
SHEATH COOK PEEL AWAY SET 9F (SHEATH) IMPLANT
SUT MNCRL AB 4-0 PS2 18 (SUTURE) ×1 IMPLANT
SUT PROLENE 2 0 SH DA (SUTURE) ×2 IMPLANT
SUT VIC AB 3-0 SH 27 (SUTURE) ×1
SUT VIC AB 3-0 SH 27X BRD (SUTURE) ×1 IMPLANT
SYR 5ML LUER SLIP (SYRINGE) ×1 IMPLANT
TOWEL GREEN STERILE (TOWEL DISPOSABLE) ×1 IMPLANT
TOWEL GREEN STERILE FF (TOWEL DISPOSABLE) ×1 IMPLANT
TRAY LAPAROSCOPIC MC (CUSTOM PROCEDURE TRAY) ×1 IMPLANT

## 2022-10-15 NOTE — Progress Notes (Signed)
Pt was scheduled for chem ed class this afternoon.  Dr Zenia Resides called me stating Michele Arnold is getting her port placed today and the OR is running behind.  Michele Arnold was able to reschedule chemo ed to occur on Tuesday 12/5 while Michele Arnold is getting her IVF.  I spoke with Michele Arnold's son, Michele Arnold and relayed this information to him.  I instructed him that his mother needs to drink 40oz of water the night before chemo and the morning of chemo.  I told them she needs to be here on 12/5 by 0715 for registration.  All questions were answered.  He verbalized understanding.

## 2022-10-15 NOTE — Transfer of Care (Signed)
Immediate Anesthesia Transfer of Care Note  Patient: Michele Arnold  Procedure(s) Performed: INSERTION PORT-A-CATH (Right: Chest)  Patient Location: PACU  Anesthesia Type:General  Level of Consciousness: awake, alert , and drowsy  Airway & Oxygen Therapy: Patient Spontanous Breathing  Post-op Assessment: Report given to RN, Post -op Vital signs reviewed and stable, and Patient moving all extremities X 4  Post vital signs: Reviewed and stable  Last Vitals:  Vitals Value Taken Time  BP 132/67 10/15/22 1205  Temp    Pulse 95 10/15/22 1206  Resp 15 10/15/22 1206  SpO2 95 % 10/15/22 1206  Vitals shown include unvalidated device data.  Last Pain:  Vitals:   10/15/22 0829  TempSrc:   PainSc: 0-No pain         Complications: No notable events documented.

## 2022-10-15 NOTE — Telephone Encounter (Signed)
Called patient via [employer]. She is aware of portacath placement today and is on her way to the hospital. All questions answered.

## 2022-10-15 NOTE — Anesthesia Postprocedure Evaluation (Signed)
Anesthesia Post Note  Patient: Michele Arnold  Procedure(s) Performed: INSERTION PORT-A-CATH (Right: Chest)     Patient location during evaluation: PACU Anesthesia Type: General Level of consciousness: awake and alert Pain management: pain level controlled Vital Signs Assessment: post-procedure vital signs reviewed and stable Respiratory status: spontaneous breathing, nonlabored ventilation, respiratory function stable and patient connected to nasal cannula oxygen Cardiovascular status: blood pressure returned to baseline and stable Postop Assessment: no apparent nausea or vomiting Anesthetic complications: no  No notable events documented.  Last Vitals:  Vitals:   10/15/22 1220 10/15/22 1235  BP: 109/69 120/70  Pulse: 79 73  Resp: 14 16  Temp:  36.4 C  SpO2: 100% 97%    Last Pain:  Vitals:   10/15/22 1235  TempSrc:   PainSc: 0-No pain                 Michele Arnold

## 2022-10-15 NOTE — H&P (Signed)
Michele Arnold is an 61 y.o. female.   Chief Complaint: recurrent gallbladder cancer HPI: Michele Arnold is a 61 yo female who underwent surgical resection of gallbladder cancer last October, followed by adjuvant Xeloda. She now has a recurrence in the right lower quadrant abdominal wall and will begin systemic chemotherapy. She presents today for port placement. She is scheduled to start treatment next week.  Past Medical History:  Diagnosis Date   Cancer [facility])    Hypertension     Past Surgical History:  Procedure Laterality Date   CHOLECYSTECTOMY  07/23/2021   INTRAOPERATIVE CHOLANGIOGRAM N/A 09/04/2021   Procedure: INTRAOPERATIVE CHOLANGIOGRAM;  Surgeon: Dwan Bolt, MD;  Location: [place];  Service: General;  Laterality: N/A;   LAPAROSCOPY N/A 09/04/2021   Procedure: STAGING LAPAROSCOPY;  Surgeon: Dwan Bolt, MD;  Location: [place];  Service: General;  Laterality: N/A;   LYMPH NODE DISSECTION N/A 09/04/2021   Procedure: PORTAL LYMPH NODE DISSECTION;  Surgeon: Dwan Bolt, MD;  Location: [place];  Service: General;  Laterality: N/A;   OPEN HEPATECTOMY  N/A 09/04/2021   Procedure: OPEN PARTIAL CENTRAL HEPATECTOMY;  Surgeon: Dwan Bolt, MD;  Location: [place];  Service: General;  Laterality: N/A;    Family History  Problem Relation Age of Onset   Cancer Maternal Aunt        unknown type   Social History:  reports that she has never smoked. She has never used smokeless tobacco. She reports that she does not currently use alcohol. She reports that she does not use drugs.  Allergies: No Known Allergies  Medications Prior to Admission  Medication Sig Dispense Refill   atorvastatin (LIPITOR) 40 MG tablet Take 40 mg by mouth daily.     hydrochlorothiazide (MICROZIDE) 12.5 MG capsule Take 12.5 mg by mouth daily.     HYDROcodone-acetaminophen (NORCO/VICODIN) 5-325 MG tablet Take 1 tablet by mouth every 6 (six) hours as needed for severe pain. (Patient taking differently: Take 1  tablet by mouth every 6 (six) hours.) 90 tablet 0   lidocaine-prilocaine (EMLA) cream Apply to affected area once (Patient taking differently: Apply 1 Application topically as needed. Apply to affected area once) 30 g 3   lisinopril (ZESTRIL) 10 MG tablet Take 1 tablet (10 mg total) by mouth daily. 30 tablet 0   ondansetron (ZOFRAN) 8 MG tablet Take 1 tablet (8 mg total) by mouth every 8 (eight) hours as needed for nausea or vomiting. Start on the third day after cisplatin. 30 tablet 1   prochlorperazine (COMPAZINE) 10 MG tablet Take 1 tablet (10 mg total) by mouth every 6 (six) hours as needed (Nausea or vomiting). 30 tablet 1   acetaminophen (TYLENOL) 325 MG tablet Take 2 tablets (650 mg total) by mouth every 6 (six) hours as needed for mild pain or fever. (Patient not taking: Reported on 10/14/2022)     urea (CARMOL) 10 % cream Apply topically to hands and feet 2 to 3 times daily as needed (Patient not taking: Reported on 10/14/2022) 71 g 2    No results found for this or any previous visit (from the past 48 hour(s)). No results found.  Review of Systems  Blood pressure (!) 140/65, pulse 87, temperature 98.5 F (36.9 C), temperature source Oral, resp. rate 18, height '5\' 4"'$  (1.626 m), weight 73.9 kg, SpO2 98 %. Physical Exam Vitals reviewed.  Constitutional:      General: She is not in acute distress.    Appearance: Normal appearance.  HENT:     Head: Normocephalic and atraumatic.  Eyes:     General: No scleral icterus.    Conjunctiva/sclera: Conjunctivae normal.  Pulmonary:     Effort: Pulmonary effort is normal. No respiratory distress.  Musculoskeletal:        General: Normal range of motion.  Skin:    General: Skin is warm and dry.     Coloration: Skin is not jaundiced.  Neurological:     General: No focal deficit present.     Mental Status: She is alert and oriented to person, place, and time.      Assessment/Plan 61 yo female with an abdominal wall recurrence of  gallbladder adenocarcinoma. Proceed to OR for portacath insertion. I reviewed the procedure details with the assistance of an in-person Spanish interpreter. Benefits and risks were reviewed, including the risk of pneumothorax. Patient agrees to proceed with surgery. Plan for CXR in PACU and discharge home.  Dwan Bolt, MD 10/15/2022, 10:14 AM

## 2022-10-15 NOTE — Op Note (Signed)
Date: 10/15/22  Patient: Michele Arnold MRN: [id number]  Preoperative Diagnosis: Recurrent gallbladder cancer Postoperative Diagnosis: Same  Procedure: Port-a-cath insertion  Surgeon: Michaelle Birks, MD  EBL: Minimal  Anesthesia: General LMA  Specimens: None  Indications: [name] is a 61 yo female with a history of gallbladder cancer, for which was first diagnosed just over a year ago and was treated with surgery and adjuvant Xeloda. She now has a recurrence in the abdominal wall and is to begin systemic chemotherapy. She presented today for port placement.  Findings: 8-Fr single-lumen power port placed via the right subclavian vein under fluoroscopic guidance. Total catheter length of 15.5cm.  Procedure details: Informed consent was obtained in the preoperative area prior to the procedure. The patient was brought to the operating room and placed on the table in the supine position. General anesthesia was induced and appropriate lines and drains were placed for intraoperative monitoring. Perioperative antibiotics were administered per SCIP guidelines. The chest and neck were prepped and draped in the usual sterile fashion. A pre-procedure timeout was taken verifying patient identity, surgical site and procedure to be performed.  The patient was placed in Trendelenberg and the right subclavian vein was accessed with a large-bore needle. A guidewire was inserted and advanced, and position in the SVC was confirmed fluoroscopically. The needle was removed and the wire was clipped to the drapes to secure its position. A small skin incision was made incorporating the wire exit site and a subcutaneous pocket was created with cautery. The port and catheter were then flushed and brought onto the field. Three 2-0 prolene sutures were used to secure the port in the subcutaneous pocket, but the sutures were not tied down. The port was placed in the pocket and the catheter was then measured using  fluoro - it was placed over the skin adjacent to the guidewire, and marked externally at the cavoatrial junction. The catheter was then cut at this location, which was at 15.5cm. The dilator and sheath were then advanced over the guidewire under fluoroscopic guidance, and the wire and dilator were removed. The end of the catheter was inserted through the sheath and advanced, and the sheath was peeled away. The port was then accessed with a Huber needle, and blood was aspirated and the port was flushed with heparinized saline. A final fluoroscopic image confirmed appropriate position of the catheter tip within the SVC, without kinking of the catheter. The prolene sutures were tied down. A final flush of 500 units heparin (100 units/mL) was given via the port. The skin was closed with a deep dermal layer of interrupted 3-0 Vicryl suture, followed by a running subcuticular 4-0 monocryl suture. Dermabond was applied.  The patient tolerated the procedure well with no apparent complications. All counts were correct x2 at the end of the procedure. The patient was extubated and taken to PACU in stable condition.  Michaelle Birks, MD 10/15/22 12:03 PM

## 2022-10-15 NOTE — Anesthesia Procedure Notes (Signed)
Procedure Name: LMA Insertion Date/Time: 10/15/2022 11:18 AM  Performed by: Gaylene Brooks, CRNAPre-anesthesia Checklist: Patient identified, Emergency Drugs available, Suction available and Patient being monitored Patient Re-evaluated:Patient Re-evaluated prior to induction Oxygen Delivery Method: Circle System Utilized Preoxygenation: Pre-oxygenation with 100% oxygen Induction Type: IV induction Ventilation: Mask ventilation without difficulty LMA: LMA inserted LMA Size: 4.0 Number of attempts: 1 Airway Equipment and Method: Bite block Placement Confirmation: positive ETCO2 Tube secured with: Tape Dental Injury: Teeth and Oropharynx as per pre-operative assessment

## 2022-10-15 NOTE — Discharge Instructions (Signed)
SURGERY DISCHARGE INSTRUCTIONS: PORT-A-CATH PLACEMENT  Activity You may resume your usual activities as tolerated Ok to shower in 24 hours, but do not bathe or submerge incision underwater. Do not drive while taking narcotic pain medication.  Wound Care Your incision is covered with skin glue called Dermabond. This will peel off on its own over time. You may shower and allow warm soapy water to run over your incisions. Gently pat dry. Do not submerge your incision underwater. Monitor your incision for any new redness, tenderness, or drainage. You may start using your port in 48 hours. Do not apply EMLA cream directly over the Dermabond (skin glue).  When to Call us: Fever greater than 100.5 New redness, drainage, or swelling at incision site Severe pain, nausea, or vomiting Shortness of breath, difficulty breathing  For questions or concerns, please call the [facility] Surgery office at [phone].

## 2022-10-18 ENCOUNTER — Encounter (HOSPITAL_COMMUNITY): Payer: Self-pay | Admitting: Surgery

## 2022-10-18 MED FILL — Dexamethasone Sodium Phosphate Inj 100 MG/10ML: INTRAMUSCULAR | Qty: 1 | Status: AC

## 2022-10-18 MED FILL — Fosaprepitant Dimeglumine For IV Infusion 150 MG (Base Eq): INTRAVENOUS | Qty: 5 | Status: AC

## 2022-10-19 ENCOUNTER — Other Ambulatory Visit: Payer: Self-pay

## 2022-10-19 ENCOUNTER — Inpatient Hospital Stay: Payer: Commercial Managed Care - HMO

## 2022-10-19 ENCOUNTER — Inpatient Hospital Stay (HOSPITAL_BASED_OUTPATIENT_CLINIC_OR_DEPARTMENT_OTHER): Payer: Commercial Managed Care - HMO | Admitting: Nurse Practitioner

## 2022-10-19 ENCOUNTER — Inpatient Hospital Stay: Payer: Commercial Managed Care - HMO | Attending: Hematology

## 2022-10-19 ENCOUNTER — Encounter: Payer: Self-pay | Admitting: Nurse Practitioner

## 2022-10-19 ENCOUNTER — Inpatient Hospital Stay: Payer: Commercial Managed Care - HMO | Admitting: Nutrition

## 2022-10-19 ENCOUNTER — Encounter: Payer: Self-pay | Admitting: Hematology

## 2022-10-19 VITALS — BP 121/66 | HR 77 | Temp 98.3°F | Resp 18

## 2022-10-19 DIAGNOSIS — Z79899 Other long term (current) drug therapy: Secondary | ICD-10-CM | POA: Insufficient documentation

## 2022-10-19 DIAGNOSIS — Z95828 Presence of other vascular implants and grafts: Secondary | ICD-10-CM

## 2022-10-19 DIAGNOSIS — Z5112 Encounter for antineoplastic immunotherapy: Secondary | ICD-10-CM | POA: Insufficient documentation

## 2022-10-19 DIAGNOSIS — Z5111 Encounter for antineoplastic chemotherapy: Secondary | ICD-10-CM | POA: Insufficient documentation

## 2022-10-19 DIAGNOSIS — C23 Malignant neoplasm of gallbladder: Secondary | ICD-10-CM | POA: Insufficient documentation

## 2022-10-19 LAB — CMP (CANCER CENTER ONLY)
ALT: 18 U/L (ref 0–44)
AST: 18 U/L (ref 15–41)
Albumin: 3.7 g/dL (ref 3.5–5.0)
Alkaline Phosphatase: 76 U/L (ref 38–126)
Anion gap: 9 (ref 5–15)
BUN: 11 mg/dL (ref 6–20)
CO2: 26 mmol/L (ref 22–32)
Calcium: 9.6 mg/dL (ref 8.9–10.3)
Chloride: 101 mmol/L (ref 98–111)
Creatinine: 0.59 mg/dL (ref 0.44–1.00)
GFR, Estimated: >60 mL/min (ref >60)
Glucose, Bld: 121 mg/dL — ABNORMAL HIGH (ref 70–99)
Potassium: 3.6 mmol/L (ref 3.5–5.1)
Sodium: 136 mmol/L (ref 135–145)
Total Bilirubin: 1.2 mg/dL (ref 0.3–1.2)
Total Protein: 7.6 g/dL (ref 6.5–8.1)

## 2022-10-19 LAB — CBC WITH DIFFERENTIAL (CANCER CENTER ONLY)
Abs Immature Granulocytes: 0.05 10*3/uL (ref 0.00–0.07)
Basophils Absolute: 0 10*3/uL (ref 0.0–0.1)
Basophils Relative: 0 %
Eosinophils Absolute: 0.2 10*3/uL (ref 0.0–0.5)
Eosinophils Relative: 2 %
HCT: 35.2 % — ABNORMAL LOW (ref 36.0–46.0)
Hemoglobin: 11.7 g/dL — ABNORMAL LOW (ref 12.0–15.0)
Immature Granulocytes: 0 %
Lymphocytes Relative: 18 %
Lymphs Abs: 2.2 10*3/uL (ref 0.7–4.0)
MCH: 31 pg (ref 26.0–34.0)
MCHC: 33.2 g/dL (ref 30.0–36.0)
MCV: 93.4 fL (ref 80.0–100.0)
Monocytes Absolute: 1 10*3/uL (ref 0.1–1.0)
Monocytes Relative: 8 %
Neutro Abs: 8.9 10*3/uL — ABNORMAL HIGH (ref 1.7–7.7)
Neutrophils Relative %: 72 %
Platelet Count: 476 10*3/uL — ABNORMAL HIGH (ref 150–400)
RBC: 3.77 MIL/uL — ABNORMAL LOW (ref 3.87–5.11)
RDW: 12 % (ref 11.5–15.5)
WBC Count: 12.4 10*3/uL — ABNORMAL HIGH (ref 4.0–10.5)
nRBC: 0 % (ref 0.0–0.2)

## 2022-10-19 LAB — TSH: TSH: 0.651 u[IU]/mL (ref 0.350–4.500)

## 2022-10-19 LAB — MAGNESIUM: Magnesium: 2 mg/dL (ref 1.7–2.4)

## 2022-10-19 MED ORDER — SODIUM CHLORIDE 0.9% FLUSH
10.0000 mL | INTRAVENOUS | Status: DC | PRN
  Administered 2022-10-19: 10 mL

## 2022-10-19 MED ORDER — POTASSIUM CHLORIDE IN NACL 20-0.9 MEQ/L-% IV SOLN
Freq: Once | INTRAVENOUS | Status: AC
  Filled 2022-10-19: qty 1000

## 2022-10-19 MED ORDER — MAGNESIUM SULFATE 2 GM/50ML IV SOLN
2.0000 g | Freq: Once | INTRAVENOUS | Status: AC
  Administered 2022-10-19: 2 g via INTRAVENOUS
  Filled 2022-10-19: qty 50

## 2022-10-19 MED ORDER — SODIUM CHLORIDE 0.9 % IV SOLN: Freq: Once | INTRAVENOUS | Status: AC

## 2022-10-19 MED ORDER — HEPARIN SOD (PORK) LOCK FLUSH 100 UNIT/ML IV SOLN
500.0000 [IU] | Freq: Once | INTRAVENOUS | Status: AC | PRN
  Administered 2022-10-19: 500 [IU]

## 2022-10-19 MED ORDER — SODIUM CHLORIDE 0.9 % IV SOLN
10.0000 mg | Freq: Once | INTRAVENOUS | Status: AC
  Administered 2022-10-19: 10 mg via INTRAVENOUS
  Filled 2022-10-19: qty 10

## 2022-10-19 MED ORDER — SODIUM CHLORIDE 0.9 % IV SOLN
1500.0000 mg | Freq: Once | INTRAVENOUS | Status: AC
  Administered 2022-10-19: 1500 mg via INTRAVENOUS
  Filled 2022-10-19: qty 30

## 2022-10-19 MED ORDER — SODIUM CHLORIDE 0.9 % IV SOLN
1000.0000 mg/m2 | Freq: Once | INTRAVENOUS | Status: AC
  Administered 2022-10-19: 1824 mg via INTRAVENOUS
  Filled 2022-10-19: qty 47.97

## 2022-10-19 MED ORDER — SODIUM CHLORIDE 0.9% FLUSH
10.0000 mL | INTRAVENOUS | Status: AC | PRN
  Administered 2022-10-19: 10 mL

## 2022-10-19 MED ORDER — SODIUM CHLORIDE 0.9 % IV SOLN
25.0000 mg/m2 | Freq: Once | INTRAVENOUS | Status: AC
  Administered 2022-10-19: 46 mg via INTRAVENOUS
  Filled 2022-10-19: qty 46

## 2022-10-19 MED ORDER — SODIUM CHLORIDE 0.9 % IV SOLN
150.0000 mg | Freq: Once | INTRAVENOUS | Status: AC
  Administered 2022-10-19: 150 mg via INTRAVENOUS
  Filled 2022-10-19: qty 150

## 2022-10-19 MED ORDER — PALONOSETRON HCL INJECTION 0.25 MG/5ML
0.2500 mg | Freq: Once | INTRAVENOUS | Status: AC
  Administered 2022-10-19: 0.25 mg via INTRAVENOUS
  Filled 2022-10-19: qty 5

## 2022-10-19 NOTE — Patient Instructions (Signed)
Instrucciones al darle de alta: Discharge Instructions Gracias por elegir al [facility] de Cncer de [place] para brindarle atencin mdica de oncologa y Music therapist.   Si usted tiene una cita de laboratorio con [place], por favor vaya directamente [place] y regstrese en el rea de Control and instrumentation engineer.   Use ropa cmoda y [place] para tener fcil acceso a las vas del Portacath (acceso venoso de Engineer, site duracin) o la lnea PICC (catter central colocado por va perifrica).   Nos esforzamos por ofrecerle tiempo de calidad con su proveedor. Es posible que tenga que volver a programar su cita si llega tarde (15 minutos o ms).  El llegar tarde le afecta a usted y a otros pacientes cuyas citas son posteriores a Merchandiser, retail.  Adems, si usted falta a tres o ms citas sin avisar a la oficina, puede ser retirado(a) de la clnica a discrecin del proveedor.      Para las solicitudes de renovacin de recetas, pida a su farmacia que se ponga en contacto con nuestra oficina y deje que transcurran 44 horas para que se complete el proceso de las renovaciones.    Hoy usted recibi los siguientes agentes de quimioterapia e/o inmunoterapia  Durvalumab (Imfinzi) , Gemcitabine (Gemzar), Cisplatin   Para ayudar a prevenir las nuseas y los vmitos despus de su tratamiento, le recomendamos que tome su medicamento para las nuseas segn las indicaciones.  LOS SNTOMAS QUE DEBEN COMUNICARSE INMEDIATAMENTE SE INDICAN A CONTINUACIN: *FIEBRE SUPERIOR A 100.4 F (38 C) O MS *ESCALOFROS O SUDORACIN *NUSEAS Y VMITOS QUE NO SE CONTROLAN CON EL MEDICAMENTO PARA LAS NUSEAS *DIFICULTAD INUSUAL PARA RESPIRAR  *MORETONES O HEMORRAGIAS NO HABITUALES *PROBLEMAS URINARIOS (dolor o ardor al Garment/textile technologist o frecuencia para Garment/textile technologist) *PROBLEMAS INTESTINALES (diarrea inusual, estreimiento, dolor cerca del ano) SENSIBILIDAD EN LA BOCA Y EN LA GARGANTA CON O SIN LA PRESENCIA DE LCERAS (dolor de garganta, llagas en la boca  o dolor de muelas/dientes) ERUPCIN, HINCHAZN O DOLORES INUSUALES FLUJO VAGINAL INUSUAL O PICAZN/RASQUIA    Los puntos marcados con un asterisco ( *) indican una posible emergencia y debe hacer un seguimiento tan pronto como le sea posible o vaya al Departamento de Emergencias si se le presenta algn problema.  Por favor, muestre la [place] DE ADVERTENCIA DE Windy Canny DE ADVERTENCIA DE Benay Spice al registrarse en [address] de Emergencias y a la enfermera de triaje.  Si tiene preguntas despus de su visita o necesita cancelar o volver a programar su cita, por favor pngase en contacto con [place]  Dept: [phone] y [place]. Las horas de oficina son de 8:00 a.m. a 4:30 p.m. de lunes a viernes. Por favor, tenga en cuenta que los mensajes de voz que se dejan despus de las 4:00 p.m. posiblemente no se devolvern hasta el siguiente da de [place].  Cerramos los fines de semana y [employer]. En todo momento tiene acceso a una enfermera para preguntas urgentes. Por favor, llame al nmero principal de la clnica Dept: [phone] y [place] instrucciones.   Para cualquier pregunta que no sea de carcter urgente, tambin puede ponerse en contacto con su proveedor [employer]. Ahora ofrecemos visitas electrnicas para cualquier persona mayor de 18 aos que solicite atencin mdica en lnea para los sntomas que no sean urgentes. Para ms detalles vaya a [email or web address].   Tambin puede bajar la aplicacin de MyChart! Vaya a la tienda de aplicaciones, busque "MyChart", abra  la aplicacin, seleccione [place], e ingrese con su nombre de usuario y la contrasea de Pharmacist, community.  Las mscaras son opcionales en los centros de Hotel manager. Si desea que su equipo de cuidados mdicos use una [employer] atienden, por favor hgaselo saber al personal. Bethann Berkshire una persona de apoyo que tenga por lo  menos 16 aos para que le acompae a sus citas. Durvalumab (Imfinzi) Injection Qu es este medicamento? El DURVALUMAB es un anticuerpo monoclonal. Clois Comber utiliza en el tratamiento del cncer de pulmn. Este medicamento puede ser utilizado para otros usos; si tiene alguna pregunta consulte con su proveedor de atencin mdica o con su farmacutico. MARCAS COMUNES: IMFINZI Qu le debo informar a mi profesional de la salud antes de tomar este medicamento? Necesitan saber si usted presenta alguno de los siguientes problemas o situaciones: enfermedades autoinmunes tales como enfermedad de Crohn, colitis ulcerativa o lupus ha tenido o planea tener un trasplante alognico de Social research officer, government ([place] las clulas madre de Theatre manager persona) antecedentes de trasplante de rganos antecedentes de radiacin en el pecho problemas del sistema nervioso, tales como miastenia grave o sndrome de Curator una reaccin alrgica o inusual al durvalumab, a otros medicamentos, alimentos, colorantes o conservantes si est embarazada o buscando quedar embarazada si est amamantando a un beb Cmo debo utilizar este medicamento? Este medicamento se administra mediante infusin en una vena. Lo administra un profesional de Technical sales engineer en un hospital o en un entorno clnico. Se le entregar una Gua del medicamento (MedGuide, su nombre en ingls) especial antes de cada tratamiento. Asegrese de leer esta informacin cada vez cuidadosamente. Hable con su pediatra para informarse acerca del uso de este medicamento en nios. Puede requerir atencin especial. Sobredosis: Pngase en contacto inmediatamente con un centro toxicolgico o una sala de urgencia si usted cree que haya tomado demasiado medicamento. ATENCIN: [employer] es solo para usted. No comparta este medicamento con nadie. Qu sucede si me olvido de una dosis? Es importante no olvidar ninguna dosis. Informe a su mdico o a su profesional de la salud si no puede  asistir a Photographer. Qu puede interactuar con este medicamento? No se han estudiado las interacciones. Puede ser que esta lista no menciona todas las posibles interacciones. Informe a su profesional de KB Home	[place] de [employer] Corporation productos a base de hierbas, medicamentos de [place] o suplementos nutritivos que est tomando. Si usted fuma, consume bebidas alcohlicas o si utiliza drogas ilegales, indqueselo tambin a su profesional de KB Home	[place]. Algunas sustancias pueden interactuar con su medicamento. A qu debo estar atento al usar Coca-Cola? Este medicamento podra hacerle sentir Retail buyer general. Contine con el tratamiento incluso si se siente enfermo, a menos que su equipo de [employer] lo suspenda. Usted podra necesitar realizarse [employer] de sangre mientras est usando [place]. No debe quedar embarazada mientras est usando este medicamento o por 3 meses despus de dejar de usarlo. Las mujeres deben informar a su equipo de atencin si estn buscando quedar embarazadas o si creen que podran estar embarazadas. Existe la posibilidad de efectos secundarios graves en un beb sin nacer. Para obtener ms informacin, hable con su equipo de atencin o farmacutico. No debe amamantar a un beb mientras est usando este medicamento o durante 3 meses despus de dejar de usarlo. Qu efectos secundarios puedo tener al [employer] este medicamento? Efectos secundarios que debe informar a su equipo de atencin tan pronto como sea posible: Reacciones alrgicas: erupcin cutnea, comezn/picazn, urticaria, hinchazn  de la cara, los labios, la lengua o la garganta Diarrea con sangre o [place], prdida de equilibrio o coordinacin, confusin o dificultad para hablar Tos seca, falta de aire o problemas para respirar Enrojecimiento, principalmente en el rostro, cuello y pecho, durante la inyeccin Niveles elevados de Dispensing optician (hiperglucemia): aumento de la sed o de  la cantidad de [place], debilidad inusual, fatiga, visin borrosa Niveles elevados de hormonas tiroideas (hipertiroidismo): frecuencia cardiaca rpida o irregular, prdida de peso, sudoracin excesiva o sensibilidad al calor, temblores o sacudidas, ansiedad, nerviosismo, ciclo menstrual irregular o sangrado ligero entre periodos menstruales Infeccin: fiebre, escalofros, tos o dolor de garganta Lesin en el hgado: dolor en la regin abdominal superior derecha, prdida de apetito, nuseas, heces de color claro, orina amarilla oscura o marrn, color amarillento de los ojos o la piel, debilidad o fatiga inusuales Funcin disminuida de las glndulas suprarrenales: nuseas, vmito, prdida del apetito, debilidad o fatiga inusuales, mareo, presin sangunea baja Niveles bajos de hormonas tiroideas (hipotiroidismo): debilidad o fatiga inusuales, aumento de la sensibilidad al fro, estreimiento, prdida de cabello, piel seca, aumento de [place], sensaciones de depresin Pancreatitis: dolor abdominal intenso que se extiende a la espalda o empeora despus de comer o al tacto, fiebre, nuseas, vmitos Erupcin, fiebre y ganglios linfticos inflamados Enrojecimiento, formacin de Nurse, children's, descamacin o distensin de la piel, incluso dentro de la boca Sibilancias: dificultad para respirar acompaada de sonidos fuertes o silbidos Efectos secundarios que generalmente no requieren atencin mdica (debe informarlos a su equipo de atencin si persisten o si son molestos): Fatiga Cada del cabello Puede ser que esta lista no menciona todos los posibles efectos secundarios. Comunquese a su mdico por asesoramiento mdico [employer]ana Inc. Usted puede informar los efectos secundarios a la FDA por telfono al 1-800-FDA-1088. Dnde debo guardar mi medicina? Este medicamento se administra en hospitales o clnicas. No se guarda en su casa. ATENCIN: Este folleto es un resumen. Puede ser que no cubra toda la  posible informacin. Si usted tiene preguntas acerca de esta medicina, consulte con su mdico, su farmacutico o su profesional de Technical sales engineer.  2023 Elsevier/Gold Standard (2021-07-08 00:00:00) Gemcitabine (Gemzar) Injection Qu es este medicamento? La GEMCITABINA es un agente quimioteraputico. Este medicamento se [place] para tratar muchos tipos de cncer, tales como cncer de mama, cncer de pulmn, cncer de pncreas y cncer de ovario. Este medicamento puede ser utilizado para otros usos; si tiene alguna pregunta consulte con su proveedor de atencin mdica o con su farmacutico. MARCAS COMUNES: Gemzar, Infugem Qu le debo informar a mi profesional de la salud antes de tomar este medicamento? Necesitan saber si usted presenta alguno de los siguientes problemas o situaciones: trastornos sanguneos infeccin enfermedad renal enfermedad heptica enfermedad pulmonar o respiratoria, como asma terapia de radiacin en curso o reciente una reaccin alrgica o inusual a la gemcitabina, a otros medicamentos quimioteraputicos, a otros medicamentos, alimentos, colorantes o conservantes si est embarazada o buscando quedar embarazada si est amamantando a un beb Cmo debo utilizar este medicamento? Este medicamento se administra mediante infusin por va intravenosa. Lo administra un profesional de la salud calificado en un hospital o en un entorno clnico. Hable con su pediatra para informarse acerca del uso de este medicamento en nios. Puede requerir atencin especial. Sobredosis: Pngase en contacto inmediatamente con un centro toxicolgico o una sala de urgencia si usted cree que haya tomado demasiado medicamento. ATENCIN: [employer] es solo para usted. No comparta este medicamento con nadie. Qu sucede si me  olvido de una dosis? Es importante no olvidar ninguna dosis. Informe a su mdico o a su profesional de la salud si no puede asistir a Photographer. Qu puede interactuar con este  medicamento? medicamentos para incrementar los conteos sanguneos, tales como filgrastim, pegfilgrastim, sargramostim algunos otros agentes quimioteraputicos como cisplatino vacunas Consulte a su mdico o a su profesional de la salud antes de tomar cualquiera de los siguientes medicamentos: acetaminofeno aspirina ibuprofeno quetoprofeno naproxeno Puede ser que esta lista no menciona todas las posibles interacciones. Informe a su profesional de KB Home	[place] de [employer] Corporation productos a base de hierbas, medicamentos de [place] o suplementos nutritivos que est tomando. Si usted fuma, consume bebidas alcohlicas o si utiliza drogas ilegales, indqueselo tambin a su profesional de KB Home	[place]. Algunas sustancias pueden interactuar con su medicamento. A qu debo estar atento al usar Coca-Cola? Visite a su mdico para que revise su evolucin. Este medicamento podra hacerle sentir un Nurse, mental health. Esto es normal, ya que la quimioterapia puede afectar tanto a las clulas sanas como a las clulas cancerosas. Si presenta algn efecto secundario, infrmelo. Contine con el tratamiento aun si se siente enfermo, a menos que su mdico le indique que lo suspenda. En algunos casos, podra recibir [employer] para ayudarlo con los efectos secundarios. Siga todas las instrucciones para usarlos. Consulte a su mdico o a su profesional de la salud si tiene fiebre, escalofros o dolor de garganta, o cualquier otro sntoma de resfro o gripe. No se trate usted mismo. Este medicamento reduce la capacidad del cuerpo para combatir infecciones. Trate de no acercarse a personas que estn enfermas. Este medicamento podra aumentar el riesgo de moretones o sangrado. Consulte a su mdico o a su profesional de la salud si observa sangrados inusuales. Proceda con cuidado al cepillar sus dientes, usar hilo dental o Risk manager palillos para los dientes, ya que podra contraer una infeccin o Therapist, art con mayor  facilidad. Si se somete a algn tratamiento dental, informe a su dentista que est [employer]. Evite usar productos que contienen aspirina, acetaminofeno, ibuprofeno, naproxeno o ketoprofeno, a menos que as lo indique su mdico. Estos productos pueden ocultar la fiebre. No debe quedar embarazada mientras est usando este medicamento o por 6 meses despus de dejar de usarlo. Las mujeres deben informar a su mdico si estn buscando quedar embarazadas o si creen que podran estar embarazadas. Los hombres no deben tener hijos mientras estn recibiendo Coca-Cola y [place] 3 meses despus de dejar de usarlo. Existe la posibilidad de efectos secundarios graves en un beb sin nacer. Para obtener ms informacin, hable con su profesional de la salud o su farmacutico. No debe Economist a un beb mientras est tomando este medicamento o durante al menos 1 semana despus de dejar de usarlo. Los hombres deben informar a su mdico si quieren tener hijos. Este medicamento puede reducir el recuento de esperma. Hable con su mdico o su profesional de la salud si est preocupado por su fertilidad. Qu efectos secundarios puedo tener al [employer] este medicamento? Efectos secundarios que debe informar a su mdico o a Barrister's clerk de la salud tan pronto como sea posible: Chief of Staff, como erupcin cutnea, comezn/picazn o urticaria, e hinchazn de la cara, los labios o la lengua problemas respiratorios dolor, enrojecimiento o Actor de la inyeccin signos y sntomas de un cambio peligroso en el pulso o ritmo cardiaco, tales como dolor en el pecho; mareos; ritmo cardiaco rpido o irregular; palpitaciones; sensacin de  desmayo o aturdimiento, cadas; problemas respiratorios signos de disminucin en la cantidad de plaquetas o sangrado: moretones, puntos rojos en la piel, heces de color negro y aspecto alquitranado, sangre en la orina signos de disminucin en la cantidad de  glbulos rojos: cansancio o debilidad inusual, sensacin de Secondary school teacher o aturdimiento, cadas signos de infeccin: fiebre o escalofros, tos, dolor de garganta, dolor o dificultad para orinar signos y sntomas de lesin al rin, tales como dificultad para orinar o cambios en la cantidad de orina signos y sntomas de lesin al hgado, como orina amarilla oscura o [place]; sensacin general de estar enfermo o sntomas gripales; heces claras; prdida del apetito; nuseas; dolor en la regin abdominal superior derecha; cansancio o debilidad inusual; color amarillento de los ojos o la piel hinchazn de tobillos, pies, manos Efectos secundarios que generalmente no requieren atencin mdica (infrmelos a su mdico o a su profesional de la salud si persisten o si son molestos): estreimiento diarrea cada del cabello prdida del apetito nuseas erupcin cutnea vmito Puede ser que esta lista no menciona todos los posibles efectos secundarios. Comunquese a su mdico por asesoramiento mdico [employer]. Usted puede informar los efectos secundarios a la FDA por telfono al 1-800-FDA-1088. Dnde debo guardar mi medicina? Este medicamento se administra en hospitales o clnicas y no necesitar guardarlo en su domicilio. ATENCIN: Este folleto es un resumen. Puede ser que no cubra toda la posible informacin. Si usted tiene preguntas acerca de esta medicina, consulte con su mdico, su farmacutico o su profesional de Technical sales engineer.  2023 Elsevier/Gold Standard (2018-03-14 00:00:00) Cisplatin Injection Qu es este medicamento? El CISPLATINO es un agente quimioteraputico. Este medicamento acta sobre las clulas que se dividen rpidamente, como las clulas cancerosas, y finalmente provoca la muerte de estas clulas. Se utiliza en el tratamiento del muchos tipos de cncer tales como cncer de vejiga, ovario y testculo. Este medicamento puede ser utilizado para otros usos; si tiene alguna  pregunta consulte con su proveedor de atencin mdica o con su farmacutico. MARCAS COMUNES: Platinol, Platinol -AQ Qu le debo informar a mi profesional de la salud antes de tomar este medicamento? Necesitan saber si usted presenta alguno de los [employer] o situaciones: enfermedad ocular, problemas de la visin problemas de la audicin enfermedad renal recuentos sanguneos bajos, como baja cantidad de glbulos blancos, plaquetas o glbulos rojos hormigueo en las manos o los pies, u otro trastorno del sistema nervioso una reaccin alrgica o inusual al cisplatino, al carboplatino, al oxaliplatino, a otros medicamentos, alimentos, colorantes o conservantes si est embarazada o buscando quedar embarazada si est amamantando a un beb Cmo debo [employer]? Este medicamento se administra como infusin en una vena. Un profesional de la salud especialmente capacitado lo administra en un hospital o clnica. Hable con su pediatra para informarse acerca del uso de este medicamento en nios. Puede requerir atencin especial. Sobredosis: Pngase en contacto inmediatamente con un centro toxicolgico o una sala de urgencia si usted cree que haya tomado demasiado medicamento. ATENCIN: [employer] es solo para usted. No comparta este medicamento con nadie. Qu sucede si me olvido de una dosis? Es importante no olvidar ninguna dosis. Informe a su mdico o a su profesional de la salud si no puede asistir a Photographer. Qu puede interactuar con este medicamento? Este medicamento podra interactuar con los siguientes frmacos: foscarnet ciertos antibiticos, tales como amikacina, gentamicina, neomicina, polimixina B, estreptomicina, tobramicina, vancomicina Puede ser que esta lista no menciona todas las posibles interacciones.  Informe a su profesional de KB Home	[place] de [employer] Corporation productos a base de hierbas, medicamentos de [place] o suplementos nutritivos que est tomando. Si  usted fuma, consume bebidas alcohlicas o si utiliza drogas ilegales, indqueselo tambin a su profesional de KB Home	[place]. Algunas sustancias pueden interactuar con su medicamento. A qu debo estar atento al usar Coca-Cola? Se supervisar su estado de salud atentamente mientras reciba este medicamento. Tendr que hacerse anlisis de sangre importantes mientras est usando este medicamento. Este medicamento podra hacerle sentir un Nurse, mental health. Esto es normal, ya que la quimioterapia puede afectar tanto a las clulas sanas como a las clulas cancerosas. Si presenta algn efecto secundario, infrmelo. Contine con el tratamiento aun si se siente enfermo, a menos que su mdico le indique que lo suspenda. Este medicamento puede aumentar su riesgo de contraer una infeccin. Consulte a su profesional de la salud si tiene fiebre, escalofros, dolor de garganta o cualquier otro sntoma de resfro o gripe. No se trate usted mismo. Trate de no acercarse a personas que estn enfermas. Evite usar [employer] contienen aspirina, acetaminofeno, ibuprofeno, naproxeno o ketoprofeno, a menos que as lo indique su profesional de KB Home	[place]. Estos productos pueden ocultar la fiebre. Este medicamento podra aumentar el riesgo de moretones o sangrado. Consulte a su mdico o a su profesional de la salud si observa sangrados inusuales. Proceda con cuidado al cepillar sus dientes, usar hilo dental o Risk manager palillos para los dientes, ya que podra contraer una infeccin o Therapist, art con mayor facilidad. Si se somete a algn tratamiento dental, informe a su dentista que est [employer]. No debe quedar embarazada mientras est usando este medicamento o por 14 meses despus de dejar de usarlo. Las mujeres deben informar a su profesional de la salud si estn buscando quedar embarazadas o si creen que podran estar embarazadas. Los hombres no deben Social worker a Social research officer, government estn recibiendo este medicamento  y durante 11 meses despus de dejar de usarlo. Existe la posibilidad de que ocurran efectos secundarios graves en un beb sin nacer. Para obtener ms informacin, hable con su profesional de KB Home	[place]. No debe amamantar a un beb mientras est [employer]. Este medicamento ha causado insuficiencia ovrica en [employer]. Este medicamento puede causar dificultades para [place]. Hable con su profesional de la salud si le preocupa su fertilidad. Este medicamento ha causado recuentos de esperma reducidos en algunos hombres. Esto puede hacer ms difcil que un hombre embarace a Musician. Hable con su profesional de la salud si le preocupa su fertilidad. Consuma lquidos segn las indicaciones mientras toma este medicamento. Esto le ayudar a [employer]. Hable con su mdico o profesional de la salud si tiene [place]. No se trate usted mismo. Qu efectos secundarios puedo tener al [employer] este medicamento? Efectos secundarios que debe informar a su mdico o a Barrister's clerk de la salud tan pronto como sea posible: Chief of Staff, tales como erupcin cutnea, comezn/picazn o urticaria, e hinchazn de la cara, los labios o la lengua visin borrosa cambios en la visin disminucin de la audicin o zumbido en los odos nuseas, Proofreader, enrojecimiento o Actor de Air cabin crew, hormigueo o entumecimiento de las manos o los pies signos y sntomas de Teacher, music, tales como heces con sangre o de color negro y aspecto alquitranado; [place] de color rojo o marrn oscuro; escupir sangre o material marrn que tiene el aspecto de posos (residuos) de caf; Geophysicist/field seismologist en  la piel; sangrado o moretones inusuales en los ojos, las encas o la nariz signos y sntomas de infeccin, tales como [place], escalofros, tos, Social research officer, government de garganta, Social research officer, government o dificultad para Garment/textile technologist signos y sntomas de lesin renal, tales como dificultad para Garment/textile technologist o cambios en la  cantidad de orina signos y sntomas de baja cantidad de glbulos rojos o anemia, tales como debilidad o cansancio inusuales; sensacin de desmayo o aturdimiento; cadas; problemas para respirar Efectos secundarios que generalmente no requieren atencin mdica (infrmelos a su mdico o a Barrister's clerk de la salud si persisten o si son molestos): prdida del apetito llagas en la boca calambres musculares Puede ser que esta lista no menciona todos los posibles efectos secundarios. Comunquese a su mdico por asesoramiento mdico [employer]. Usted puede informar los efectos secundarios a la FDA por telfono al 1-800-FDA-1088. Dnde debo guardar mi medicina? Este medicamento se administra en hospitales o clnicas, y no necesitar guardarlo en su domicilio. ATENCIN: Este folleto es un resumen. Puede ser que no cubra toda la posible informacin. Si usted tiene preguntas acerca de esta medicina, consulte con su mdico, su farmacutico o su profesional de Technical sales engineer.  2023 Elsevier/Gold Standard (2020-03-06 00:00:00)

## 2022-10-19 NOTE — Progress Notes (Signed)
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 Cholelithiasis with distended gallbladder. No CT evidence of acute cholecystitis. Consider further evaluation with right upper quadrant ultrasound as clinically indicated. 2. Punctate nonobstructive left nephrolithiasis. 3. Aortic Atherosclerosis (ICD10-I70.0).   07/21/2021 Imaging   ABD [place] RUQ IMPRESSION: 1. Cholelithiasis without evidence of acute cholecystitis. 2. Mild hepatic steatosis.     07/21/2021 Imaging   MRA/MRCP IMPRESSION: 1. No biliary ductal dilation. No choledocholithiasis. 2. Mild hepatic steatosis. 3. Cholelithiasis with dilation of the gallbladder and trace pericholecystic fluid. No gallbladder wall thickening or abnormal wall enhancement. Findings which are equivocal for acute cholecystitis. Consider further evaluation with nuclear medicine HIDA scan to assess cystic duct patency if clinically indicated.   07/22/2021 Imaging   HIDA SCAN IMPRESSION: Scintigraphic findings most consistent with acute cholecystitis.   07/23/2021 Surgery   Preop Dx:        Acute cholecystitis Postop Dx:      Acute cholecystitis with 1 cm stone impacted in the infundibulum Procedure:      Xi robotic cholecystectomy with ICG By Dr. Johnathan Hausen   07/23/2021 Pathology Results   FINAL MICROSCOPIC DIAGNOSIS:  A. GALLBLADDER, CHOLECYSTECTOMY:  - Invasive poorly differentiated adenocarcinoma, 4.3 cm, involving  gallbladder neck  - Carcinoma  invades perimuscular soft tissue  - Cystic duct margin is focally positive for carcinoma  - Focally suspicious for lymphovascular invasion   Procedure: Cholecystectomy  Tumor Site: Gallbladder neck  Tumor Size: 4.3 cm  Histologic Type: Adenocarcinoma  Histologic Grade: G3: Poorly differentiated  Tumor Extension: Tumor invades perimuscular connective tissue on the peritoneal side without serosal involvement  Margins:       Margin Status for Invasive Carcinoma: Cystic duct margin is positive for carcinoma  Regional Lymph Nodes: Not applicable (no lymph nodes submitted or found)  Distant Metastasis:       Distant Site(s) Involved: Not applicable  Pathologic Stage Classification (pTNM, AJCC 8th Edition): pT2a, pN not assigned    08/20/2021 Imaging   CT chest IMPRESSION: Negative. No CT evidence for acute intrathoracic abnormality. Negative for pulmonary nodule or evidence for metastatic disease to the chest.   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 Michaelle Birks Staging laparoscopy Exploratory laparotomy with intraoperative cholangiogram Excision of new cystic duct margin Partial central hepatectomy (resection of gallbladder fossa - segments 4b and 5) Portal lymph node dissection   09/04/2021 Pathology Results   FINAL MICROSCOPIC DIAGNOSIS:  A. CYSTIC DUCT, NEW MARGIN, EXCISION:  -  No adenocarcinoma identified  B. LYMPH NODE, PORTAL, EXCISION:  -  Nodular fat necrosis and fibrosis  -  No nodal tissue identified  C. GALLBLADDER, FOSSA, EXCISION:  -  Benign liver with periductular chronic inflammation and  macrovesicular steatosis  -  No adenocarcinoma identified  D. LYMPH NODE, STATION EIGHT, EXCISION:  -  No adenocarcinoma identified in one lymph node (0/1)    09/04/2021 Cancer Staging  Staging form: Gallbladder, AJCC 8th Edition - Clinical  stage from 09/04/2021: Stage IIA (cT2a, cN0, cM0) - Signed by Alla Feeling, NP on 09/21/2021 Stage prefix: Initial diagnosis Total positive nodes: 0 Histologic grade (G): G3 Histologic grading system: 3 grade system Histologic sub-type: Adenocarcinoma   09/28/2021 - 09/28/2021 Chemotherapy   Patient is on Treatment Plan : BREAST Capecitabine q21d     09/30/2022 Relapse/Recurrence   FINAL MICROSCOPIC DIAGNOSIS:  A. 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  It is composed of solid sheets and nests of atypical cells with variably enlarged round to oval irregular nuclei and a variable amount of eosinophilic focally vacuolated cytoplasm, and these sheets and nests are admixed with infiltrating irregular angulated glands lined by an atypical columnar epithelium composed of similar-appearing cells.  Glandular lumens contain mucin.  Focally these epithelia are contiguous and are present within the same sheet and/or gland. Five immunohistochemical stains performed with adequate control. The tumor is positive for cytokeratin 7 and the solid sheets and nests are positive for the squamous marker p40.  The tumor is negative for cytokeratin 20.  The tumor is also negative for the GI marker CDX2.  The tumor is negative for the hepatic marker arginase.  The prior cholecystectomy specimen ([id number]) is reviewed and this carcinoma shows identical cytohistomorphology as the previously diagnosed gallbladder adenocarcinoma.    10/01/2022 PET scan   IMPRESSION: 1. Progressive soft tissue mass involving the right lower quadrant ventral abdominal wall is intensely FDG avid compatible with recurrent tumor. 2. No signs of FDG avid nodal or solid organ metastasis. 3. Multiple fat containing ventral abdominal  wall hernias. 4.  Aortic Atherosclerosis (ICD10-I70.0).   10/12/2022 - 10/12/2022 Chemotherapy   Patient is on Treatment Plan : BILIARY TRACT Cisplatin + Gemcitabine D1,8 q21d     10/12/2022 - 10/12/2022 Chemotherapy   Patient is on Treatment Plan : BLADDER Durvalumab (10) q14d     10/19/2022 -  Chemotherapy   Patient is on Treatment Plan : BILIARY TRACT Cisplatin + Gemcitabine D1,8 q21d     10/19/2022 -  Chemotherapy   Patient is on Treatment Plan : BLADDER Durvalumab (10) q14d       CURRENT THERAPY: First line gemcitabine/cisplatin on days 1 and 8 q21 days and durvalumab q21 days; starting 10/19/22  INTERVAL HISTORY: Ms. Dray returns for follow up and to start treatment, I saw her with Spanish interpreter in the infusion room stating prehydration fluids. Last seen by Dr. Burr Medico 10/05/22. Port placed by Dr. Zenia Resides 12/1.  She has baseline right lower quadrant pain that worsened with biopsy, now back to baseline.  She takes meds every 5-6 hours which helps, pain currently at a 3 out of 10.  She had an episode of chills with severe pain when she waited 8 hours to take medication 1 time.  Otherwise no fever or other signs of infection.  She is eating and drinking.  Takes laxative for mild occasional constipation.  Denies nausea/vomiting.  Tolerating light housework but not like she used to, as been as helpful.  All other systems were reviewed with the patient and are negative.  MEDICAL HISTORY:  Past Medical History:  Diagnosis Date   Cancer [facility])    Hypertension     SURGICAL HISTORY: Past Surgical History:  Procedure Laterality Date   CHOLECYSTECTOMY  07/23/2021   INTRAOPERATIVE CHOLANGIOGRAM  N/A 09/04/2021   Procedure: INTRAOPERATIVE CHOLANGIOGRAM;  Surgeon: Dwan Bolt, MD;  Location: [place];  Service: General;  Laterality: N/A;   LAPAROSCOPY N/A 09/04/2021   Procedure: STAGING LAPAROSCOPY;  Surgeon: Dwan Bolt, MD;  Location: [place];  Service: General;  Laterality: N/A;    LYMPH NODE DISSECTION N/A 09/04/2021   Procedure: PORTAL LYMPH NODE DISSECTION;  Surgeon: Dwan Bolt, MD;  Location: [place];  Service: General;  Laterality: N/A;   OPEN HEPATECTOMY  N/A 09/04/2021   Procedure: OPEN PARTIAL CENTRAL HEPATECTOMY;  Surgeon: Dwan Bolt, MD;  Location: [place];  Service: General;  Laterality: N/A;   PORTACATH PLACEMENT Right 10/15/2022   Procedure: INSERTION PORT-A-CATH;  Surgeon: Dwan Bolt, MD;  Location: [place];  Service: General;  Laterality: Right;    I have reviewed the social history and family history with the patient and they are unchanged from previous note.  ALLERGIES:  has No Known Allergies.  MEDICATIONS:  Current Outpatient Medications  Medication Sig Dispense Refill   acetaminophen (TYLENOL) 325 MG tablet Take 2 tablets (650 mg total) by mouth every 6 (six) hours as needed for mild pain or fever. (Patient not taking: Reported on 10/14/2022)     atorvastatin (LIPITOR) 40 MG tablet Take 40 mg by mouth daily.     hydrochlorothiazide (MICROZIDE) 12.5 MG capsule Take 12.5 mg by mouth daily.     HYDROcodone-acetaminophen (NORCO/VICODIN) 5-325 MG tablet Take 1 tablet by mouth every 6 (six) hours as needed for severe pain. (Patient taking differently: Take 1 tablet by mouth every 6 (six) hours.) 90 tablet 0   lidocaine-prilocaine (EMLA) cream Apply to affected area once (Patient taking differently: Apply 1 Application topically as needed. Apply to affected area once) 30 g 3   lisinopril (ZESTRIL) 10 MG tablet Take 1 tablet (10 mg total) by mouth daily. 30 tablet 0   ondansetron (ZOFRAN) 8 MG tablet Take 1 tablet (8 mg total) by mouth every 8 (eight) hours as needed for nausea or vomiting. Start on the third day after cisplatin. 30 tablet 1   prochlorperazine (COMPAZINE) 10 MG tablet Take 1 tablet (10 mg total) by mouth every 6 (six) hours as needed (Nausea or vomiting). 30 tablet 1   urea (CARMOL) 10 % cream Apply topically to hands and feet 2 to 3  times daily as needed (Patient not taking: Reported on 10/14/2022) 71 g 2   No current facility-administered medications for this visit.   Facility-Administered Medications Ordered in Other Visits  Medication Dose Route Frequency Provider Last Rate Last Admin   CISplatin (PLATINOL) 46 mg in sodium chloride 0.9 % 250 mL chemo infusion  25 mg/m2 (Treatment Plan Recorded) Intravenous Once Truitt Merle, MD       durvalumab (IMFINZI) 1,500 mg in sodium chloride 0.9 % 100 mL chemo infusion  1,500 mg Intravenous Once Truitt Merle, MD 130 mL/hr at 10/19/22 1211 1,500 mg at 10/19/22 1211   gemcitabine (GEMZAR) 1,824 mg in sodium chloride 0.9 % 250 mL chemo infusion  1,000 mg/m2 (Treatment Plan Recorded) Intravenous Once Truitt Merle, MD       heparin lock flush 100 unit/mL  500 Units Intracatheter Once PRN Truitt Merle, MD       sodium chloride flush (NS) 0.9 % injection 10 mL  10 mL Intracatheter PRN Truitt Merle, MD        PHYSICAL EXAMINATION: ECOG PERFORMANCE STATUS: 1 - Symptomatic but completely ambulatory  See infusion flowsheet for vital signs  There were no vitals filed for this visit. There were no vitals filed for this visit.  GENERAL:alert, no distress and comfortable SKIN: no rash  EYES: sclera clear LUNGS: clear with normal breathing effort HEART: regular rate & rhythm, no lower extremity edema ABDOMEN:abdomen soft, normal bowel sounds. Fullness and TTP to RLQ without palpable mass NEURO: alert & oriented x 3 with fluent speech, no focal motor/sensory deficits PAC covered with gauze  LABORATORY DATA:  I have reviewed the data as listed    Latest Ref Rng & Units 10/19/2022    7:57 AM 09/30/2022   11:26 AM 09/15/2022    8:35 PM  CBC  WBC 4.0 - 10.5 K/uL 12.4  10.6  8.7   Hemoglobin 12.0 - 15.0 g/dL 11.7  12.4  12.3   Hematocrit 36.0 - 46.0 % 35.2  37.6  37.4   Platelets 150 - 400 K/uL 476  505  451         Latest Ref Rng & Units 10/19/2022    7:57 AM 09/15/2022    8:35 PM 09/06/2022     9:56 AM  CMP  Glucose 70 - 99 mg/dL 121  135  107   BUN 6 - 20 mg/dL '11  16  15   '$ Creatinine 0.44 - 1.00 mg/dL 0.59  0.62  0.67   Sodium 135 - 145 mmol/L 136  137  138   Potassium 3.5 - 5.1 mmol/L 3.6  3.8  3.8   Chloride 98 - 111 mmol/L 101  105  106   CO2 22 - 32 mmol/L '26  24  25   '$ Calcium 8.9 - 10.3 mg/dL 9.6  8.9  9.2   Total Protein 6.5 - 8.1 g/dL 7.6  8.0  8.2   Total Bilirubin 0.3 - 1.2 mg/dL 1.2  1.0  0.8   Alkaline Phos 38 - 126 U/L 76  93  101   AST 15 - 41 U/L '18  19  15   '$ ALT 0 - 44 U/L '18  14  13       '$ RADIOGRAPHIC STUDIES: I have personally reviewed the radiological images as listed and agreed with the findings in the report. No results found.   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 Zenia Resides 09/04/2021  -S/p 6 months adjuvant chemotherapy with Xeloda -She developed abdominal pain, bx abd wall mass 09/20/22 confirmed metastatic recurrence of gallbladder cancer  -PET scan 10/01/22 showed no other areas of hypermetabolism.  -She had port placed 12/1 and presents to start first line systemic chemotherapy with cisplatin and gemcitabine on days 1, 8 w21 days and durvalumab q21 days. She previously consented.  -Ms. Travaglini appears stable, she recovered well from biopsy, abdominal pain is back to baseline.  She is otherwise doing well.  -We again reviewed the nature of metastatic/recurrent gallbladder cancer.  Dr. Burr Medico plans to review with Dr. Zenia Resides to see if she would offer more surgery, but if not, pt understands this is not operable and therefore likely not curable, but still very treatable, she has limited disease burden and is a good candidate for treatment.  -The plan is for systemic chemo with first-line cisplatin/gemcitabine and Durvalumab.  We again reviewed the  potential benefit, side effects, goal, and symptom management.  She agrees to proceed -She had other questions which I answered to the  best of my ability - labs reviewed, adequate to proceed with treatment -She will have chemo Ed after this visit. -Return next week for day 8 gem/cis, then f/up and cycle 2 in 3 weeks, or sooner if needed   Abdominal pain, transaminitis, hyperbilirubinemia -secondary to #1 -Transaminitis and elevated bilirubin are intermittent -she developed RLQ pain with recurrence, mild - moderate. Takes norco q5-6 hours which is helpful -monitoring bowels on opioids, ok to take stool softener or laxative PRN  3. Hypertension -continue meds and PCP f/up   4. Social -She began a new job 1 week prior to her illness, she states she still has a job but not sure what her role will be when she returns -Lives with spouse, has 2 daughters. She has good family support -Followed by social work/financial for co-pay assistance -She officially became a [place] citizen on 02/05/52    PLAN: -Labs, PET scan, Treatment regimen, and symptom management reviewed -Proceed with cycle 1 day 1 gem/cis and durvalumab today as planned -Day 8 next week -F/up and cycle 2 in 3 weeks   All questions were answered. The patient knows to call the clinic with any problems, questions or concerns. No barriers to learning were detected. I spent 20 minutes counseling the patient face to face. The total time spent in the appointment was 30 minutes and more than 50% was on counseling and review of test results     Alla Feeling, NP 10/19/22

## 2022-10-19 NOTE — Progress Notes (Signed)
Nutrition visit completed with CAP interpreter/Blanca.  Nutrition assessment completed with 61 year old female diagnosed with recurrent gallbladder cancer.  She is being followed by Dr. Burr Medico and received cisplatin/gemcitabine/durvalumab.  Past medical history includes cholecystectomy and hypertension.  Medications include Zofran and Compazine.  Labs include glucose 121 on December 5.  Height: 5 feet 4 inches. Weight: 163 pounds. Usual body weight: 165 pounds in July, 2023. BMI: 27.98.  Patient denies nutrition impact symptoms.  She does not have a history of weight loss.  She generally eats a regular diet and seems to enjoy most foods.  Nutrition diagnosis: Food and nutrition related knowledge deficit related to new diagnosis of recurrent gallbladder cancer as evidenced by no prior need for nutrition related information.  Intervention: Educated patient on the importance of smaller more frequent meals and snacks choosing high-protein foods and adequate calories for weight maintenance. Reviewed strategies on foods to choose if patient has nausea. Educated on importance of protein. Encouraged weight maintenance. Provided American [employer]'s booklet entitled Nutrition for People with Cancer in [place]. Questions were answered.  Monitoring, evaluation, goals: Patient will tolerate adequate calories and protein to minimize weight loss throughout treatment.  Next visit: Patient will contact RD for questions or concerns.  **Disclaimer: This note was dictated with voice recognition software. Similar sounding words can inadvertently be transcribed and this note may contain transcription errors which may not have been corrected upon publication of note.**

## 2022-10-20 ENCOUNTER — Telehealth: Payer: Self-pay | Admitting: Hematology

## 2022-10-20 ENCOUNTER — Encounter: Payer: Self-pay | Admitting: Hematology

## 2022-10-20 ENCOUNTER — Encounter: Payer: Commercial Managed Care - HMO | Admitting: Dietician

## 2022-10-20 NOTE — Telephone Encounter (Signed)
Called patient about upcoming appointments unable to leave voicemail

## 2022-10-20 NOTE — Telephone Encounter (Signed)
-----   Message from Ishmael Holter, RN sent at 10/19/2022  4:28 PM EST ----- Regarding: Dr. Burr Medico 1st tx f/u call Dr. Burr Medico 1st tx f/u call. Imfinzi, Gemzar, Cisplatin - tolerated well. Spanish speaking

## 2022-10-20 NOTE — Telephone Encounter (Signed)
Tried to call pt using interpreter service to see how she did with her treatment yest.  Interpreter tried to reach her by phone but unable to leave VM message to call back.

## 2022-10-21 ENCOUNTER — Encounter: Payer: Self-pay | Admitting: Hematology

## 2022-10-21 LAB — T4: T4, Total: 12.3 ug/dL — ABNORMAL HIGH (ref 4.5–12.0)

## 2022-10-22 ENCOUNTER — Other Ambulatory Visit: Payer: Self-pay

## 2022-10-24 ENCOUNTER — Other Ambulatory Visit: Payer: Self-pay

## 2022-10-26 NOTE — Progress Notes (Unsigned)
[place]   Telephone:([phone]) [phone] Fax:(336) [phone]   Clinic Follow up Note   Patient Care Team: Geradine Girt, DO as PCP - General (Internal Medicine) Dwan Bolt, MD as Consulting Physician (General Surgery) Truitt Merle, MD as Consulting Physician (Hematology) [place] as Consulting Physician (Radiology)  Date of Service:  10/28/2022  CHIEF COMPLAINT: f/u of recurrent gallbladder cancer    CURRENT THERAPY:  First line gemcitabine/cisplatin on days 1 and 8 q21 days and durvalumab q21 days; starting 10/19/22    ASSESSMENT:  Michele Arnold is a 61 y.o. 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 -she started neoadjuvant chemo cisplatin, gemcitabine and durvalumab on 10/19/22, plan to have surgical resection after chemo   Abdominal pain -secondary to cancer recurrence at abdominal wall  -on Norco as needed  -I added MS contin '15mg'$  q12h   Essential hypertension -on meds -will monitor closely when on chemo   Chemotherapy-induced nausea -Patient has had a intermittent nausea since last chemo, I discussed antiemetics extensively with the patient and wrote down the instruction for her -Will add dexamethasone 4 mg daily for 3 to 5 days after chemo, prescription called in today.     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 Cholelithiasis with distended gallbladder. No CT evidence of acute cholecystitis. Consider further evaluation with right upper quadrant ultrasound as clinically indicated. 2. Punctate nonobstructive left nephrolithiasis. 3. Aortic Atherosclerosis (ICD10-I70.0).   07/21/2021 Imaging   ABD [place] RUQ IMPRESSION: 1. Cholelithiasis without evidence of acute cholecystitis. 2. Mild hepatic steatosis.     07/21/2021 Imaging   MRA/MRCP IMPRESSION: 1. No biliary ductal dilation. No choledocholithiasis. 2. Mild hepatic steatosis. 3. Cholelithiasis with dilation of the gallbladder and trace pericholecystic fluid. No gallbladder wall thickening or abnormal wall enhancement. Findings which are equivocal for acute cholecystitis. Consider further evaluation with nuclear medicine HIDA scan to assess cystic duct patency if clinically indicated.   07/22/2021 Imaging   HIDA SCAN IMPRESSION: Scintigraphic findings most consistent with acute cholecystitis.   07/23/2021 Surgery   Preop Dx:        Acute cholecystitis Postop Dx:      Acute cholecystitis with 1 cm stone impacted in the infundibulum Procedure:      Xi robotic cholecystectomy with ICG By Dr. Johnathan Hausen   07/23/2021 Pathology Results   FINAL MICROSCOPIC DIAGNOSIS:  A. GALLBLADDER, CHOLECYSTECTOMY:  - Invasive poorly differentiated adenocarcinoma, 4.3 cm, involving  gallbladder neck  - Carcinoma invades perimuscular soft tissue  - Cystic duct margin is focally positive for carcinoma  - Focally suspicious for lymphovascular invasion   Procedure: Cholecystectomy  Tumor Site: Gallbladder neck  Tumor Size: 4.3 cm  Histologic Type: Adenocarcinoma  Histologic Grade: G3: Poorly differentiated  Tumor Extension: Tumor invades perimuscular connective tissue on the peritoneal side without  serosal involvement   Margins:       Margin Status for Invasive Carcinoma: Cystic duct margin is positive for carcinoma  Regional Lymph Nodes: Not applicable (no lymph nodes submitted or found)  Distant Metastasis:       Distant Site(s) Involved: Not applicable  Pathologic Stage Classification (pTNM, AJCC 8th Edition): pT2a, pN not assigned    08/20/2021 Imaging   CT chest IMPRESSION: Negative. No CT evidence for acute intrathoracic abnormality. Negative for pulmonary nodule or evidence for metastatic disease to the chest.   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 Michaelle Birks Staging laparoscopy Exploratory laparotomy with intraoperative cholangiogram Excision of new cystic duct margin Partial central hepatectomy (resection of gallbladder fossa - segments 4b and 5) Portal lymph node dissection   09/04/2021 Pathology Results   FINAL MICROSCOPIC DIAGNOSIS:  A. CYSTIC DUCT, NEW MARGIN, EXCISION:  -  No adenocarcinoma identified  B. LYMPH NODE, PORTAL, EXCISION:  -  Nodular fat necrosis and fibrosis  -  No nodal tissue identified  C. GALLBLADDER, FOSSA, EXCISION:  -  Benign liver with periductular chronic inflammation and  macrovesicular steatosis  -  No adenocarcinoma identified  D. LYMPH NODE, STATION EIGHT, EXCISION:  -  No adenocarcinoma identified in one lymph node (0/1)    09/04/2021 Cancer Staging   Staging form: Gallbladder, AJCC 8th Edition - Clinical stage from 09/04/2021: Stage IIA (cT2a, cN0, cM0) - Signed by Alla Feeling, NP on 09/21/2021 Stage prefix: Initial diagnosis Total positive nodes: 0 Histologic grade (G): G3 Histologic grading system: 3 grade system Histologic sub-type: Adenocarcinoma   09/28/2021 - 09/28/2021 Chemotherapy   Patient is on Treatment Plan : BREAST Capecitabine q21d     09/30/2022 Relapse/Recurrence   FINAL MICROSCOPIC  DIAGNOSIS:  A. 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  It is composed of solid sheets and nests of atypical cells with variably enlarged round to oval irregular nuclei and a variable amount of eosinophilic focally vacuolated cytoplasm, and these sheets and nests are admixed with infiltrating irregular angulated glands lined by an atypical columnar epithelium composed of similar-appearing cells.  Glandular lumens contain mucin.  Focally these epithelia are contiguous and are present within the same sheet and/or gland. Five immunohistochemical stains performed with adequate control. The tumor is positive for cytokeratin 7 and the solid sheets and nests are positive for the squamous marker p40.  The tumor is negative for cytokeratin 20.  The tumor is also negative for the GI marker CDX2.  The tumor is negative for the hepatic marker arginase.  The prior cholecystectomy specimen ([id number]) is reviewed and this carcinoma shows identical cytohistomorphology as the previously diagnosed gallbladder adenocarcinoma.    10/01/2022 PET scan   IMPRESSION: 1. Progressive soft tissue mass involving the right lower quadrant ventral abdominal wall is intensely FDG avid compatible with recurrent tumor. 2. No signs of FDG avid nodal or solid organ metastasis. 3. Multiple fat containing ventral abdominal wall hernias. 4.  Aortic Atherosclerosis (ICD10-I70.0).   10/12/2022 - 10/12/2022 Chemotherapy   Patient is on Treatment Plan : BILIARY TRACT Cisplatin + Gemcitabine D1,8 q21d     10/12/2022 - 10/12/2022 Chemotherapy   Patient is on Treatment Plan : BLADDER Durvalumab (10) q14d  10/19/2022 -  Chemotherapy   Patient is on Treatment Plan : BILIARY TRACT Cisplatin + Gemcitabine D1,8 q21d     10/19/2022  -  Chemotherapy   Patient is on Treatment Plan : BLADDER Durvalumab (10) q14d        INTERVAL HISTORY:  Michele Arnold is here for a follow up of recurrent gallbladder cancer.  She was last seen by NP Lacie on 10/19/2022 She presents to the clinic accompanied by husband and interpreter. Pt reports that she has pain in the stomach. Pt states when she states the hydrocodone doesn't completely take the pain away. Pt experience nausea after chemo, not able to eat .She has been taking the nausea medication.    All other systems were reviewed with the patient and are negative.  MEDICAL HISTORY:  Past Medical History:  Diagnosis Date   Cancer [facility])    Hypertension     SURGICAL HISTORY: Past Surgical History:  Procedure Laterality Date   CHOLECYSTECTOMY  07/23/2021   INTRAOPERATIVE CHOLANGIOGRAM N/A 09/04/2021   Procedure: INTRAOPERATIVE CHOLANGIOGRAM;  Surgeon: Dwan Bolt, MD;  Location: [facility];  Service: General;  Laterality: N/A;   LAPAROSCOPY N/A 09/04/2021   Procedure: STAGING LAPAROSCOPY;  Surgeon: Dwan Bolt, MD;  Location: [place];  Service: General;  Laterality: N/A;   LYMPH NODE DISSECTION N/A 09/04/2021   Procedure: PORTAL LYMPH NODE DISSECTION;  Surgeon: Dwan Bolt, MD;  Location: [facility];  Service: General;  Laterality: N/A;   OPEN HEPATECTOMY  N/A 09/04/2021   Procedure: OPEN PARTIAL CENTRAL HEPATECTOMY;  Surgeon: Dwan Bolt, MD;  Location: [facility] [place];  Service: General;  Laterality: N/A;   PORTACATH PLACEMENT Right 10/15/2022   Procedure: INSERTION PORT-A-CATH;  Surgeon: Dwan Bolt, MD;  Location: [facility];  Service: General;  Laterality: Right;    I have reviewed the social history and family history with the patient and they are unchanged from previous note.  ALLERGIES:  has No Known Allergies.  MEDICATIONS:  Current Outpatient Medications  Medication Sig Dispense Refill   dexamethasone (DECADRON) 4 MG tablet Take 1 tablet (4 mg total) by mouth 2 (two)  times daily with a meal. 30 tablet 0   morphine (MS CONTIN) 15 MG 12 hr tablet Take 1 tablet (15 mg total) by mouth every 12 (twelve) hours. 30 tablet 0   acetaminophen (TYLENOL) 325 MG tablet Take 2 tablets (650 mg total) by mouth every 6 (six) hours as needed for mild pain or fever. (Patient not taking: Reported on 10/14/2022)     atorvastatin (LIPITOR) 40 MG tablet Take 40 mg by mouth daily.     hydrochlorothiazide (MICROZIDE) 12.5 MG capsule Take 12.5 mg by mouth daily.     HYDROcodone-acetaminophen (NORCO/VICODIN) 5-325 MG tablet Take 1 tablet by mouth every 6 (six) hours as needed for severe pain. (Patient taking differently: Take 1 tablet by mouth every 6 (six) hours.) 90 tablet 0   lidocaine-prilocaine (EMLA) cream Apply to affected area once (Patient taking differently: Apply 1 Application topically as needed. Apply to affected area once) 30 g 3   lisinopril (ZESTRIL) 10 MG tablet Take 1 tablet (10 mg total) by mouth daily. 30 tablet 0   ondansetron (ZOFRAN) 8 MG tablet Take 1 tablet (8 mg total) by mouth every 8 (eight) hours as needed for nausea or vomiting. Start on the third day after cisplatin. 30 tablet 1   prochlorperazine (COMPAZINE) 10 MG tablet Take 1 tablet (10 mg total) by mouth every 6 (six)  hours as needed (Nausea or vomiting). 30 tablet 1   urea (CARMOL) 10 % cream Apply topically to hands and feet 2 to 3 times daily as needed (Patient not taking: Reported on 10/14/2022) 71 g 2   No current facility-administered medications for this visit.   Facility-Administered Medications Ordered in Other Visits  Medication Dose Route Frequency Provider Last Rate Last Admin   0.9 % NaCl with KCl 20 mEq/ L  infusion   Intravenous Once Truitt Merle, MD       CISplatin (PLATINOL) 46 mg in sodium chloride 0.9 % 250 mL chemo infusion  25 mg/m2 (Treatment Plan Recorded) Intravenous Once Truitt Merle, MD       dexamethasone (DECADRON) 10 mg in sodium chloride 0.9 % 50 mL IVPB  10 mg Intravenous Once  Truitt Merle, MD       fosaprepitant (EMEND) 150 mg in sodium chloride 0.9 % 145 mL IVPB  150 mg Intravenous Once Truitt Merle, MD       gemcitabine (GEMZAR) 1,824 mg in sodium chloride 0.9 % 250 mL chemo infusion  1,000 mg/m2 (Treatment Plan Recorded) Intravenous Once Truitt Merle, MD       heparin lock flush 100 unit/mL  500 Units Intracatheter Once PRN Truitt Merle, MD       magnesium sulfate IVPB 2 g 50 mL  2 g Intravenous Once Truitt Merle, MD       palonosetron (ALOXI) injection 0.25 mg  0.25 mg Intravenous Once Truitt Merle, MD       sodium chloride flush (NS) 0.9 % injection 10 mL  10 mL Intracatheter PRN Truitt Merle, MD        PHYSICAL EXAMINATION: ECOG PERFORMANCE STATUS: 1 - Symptomatic but completely ambulatory  Vitals:   10/28/22 0810  BP: (!) 129/50  Pulse: 85  Resp: 18  Temp: 98.7 F (37.1 C)  SpO2: 100%   Wt Readings from Last 3 Encounters:  10/28/22 156 lb 9.6 oz (71 kg)  10/15/22 163 lb (73.9 kg)  10/05/22 163 lb 8 oz (74.2 kg)     GENERAL:alert, no distress and comfortable SKIN: skin color normal, no rashes or significant lesions EYES: normal, Conjunctiva are pink and non-injected, sclera clear  NEURO: alert & oriented x 3 with fluent speech  LABORATORY DATA:  I have reviewed the data as listed    Latest Ref Rng & Units 10/28/2022    7:46 AM 10/19/2022    7:57 AM 09/30/2022   11:26 AM  CBC  WBC 4.0 - 10.5 K/uL 10.6  12.4  10.6   Hemoglobin 12.0 - 15.0 g/dL 10.9  11.7  12.4   Hematocrit 36.0 - 46.0 % 32.4  35.2  37.6   Platelets 150 - 400 K/uL 271  476  505         Latest Ref Rng & Units 10/28/2022    7:46 AM 10/19/2022    7:57 AM 09/15/2022    8:35 PM  CMP  Glucose 70 - 99 mg/dL 133  121  135   BUN 6 - 20 mg/dL '9  11  16   '$ Creatinine 0.44 - 1.00 mg/dL 0.58  0.59  0.62   Sodium 135 - 145 mmol/L 133  136  137   Potassium 3.5 - 5.1 mmol/L 3.5  3.6  3.8   Chloride 98 - 111 mmol/L 100  101  105   CO2 22 - 32 mmol/L '24  26  24   '$ Calcium 8.9 - 10.3 mg/dL 9.1  9.6  8.9    Total Protein 6.5 - 8.1 g/dL 7.0  7.6  8.0   Total Bilirubin 0.3 - 1.2 mg/dL 0.7  1.2  1.0   Alkaline Phos 38 - 126 U/L 93  76  93   AST 15 - 41 U/L '17  18  19   '$ ALT 0 - 44 U/L '17  18  14       '$ RADIOGRAPHIC STUDIES: I have personally reviewed the radiological images as listed and agreed with the findings in the report. No results found.    Orders Placed This Encounter  Procedures   CBC with Differential ([place] Only)    Standing Status:   Future    Standing Expiration Date:   11/12/2023   CMP ([place] only)    Standing Status:   Future    Standing Expiration Date:   11/12/2023   Magnesium    Standing Status:   Future    Standing Expiration Date:   11/12/2023   CBC with Differential ([facility] Only)    Standing Status:   Future    Standing Expiration Date:   11/19/2023   CMP ([place] only)    Standing Status:   Future    Standing Expiration Date:   11/19/2023   Magnesium    Standing Status:   Future    Standing Expiration Date:   11/19/2023   Ambulatory Referral to [facility] Nutrition    Referral Priority:   Urgent    Referral Type:   Consultation    Referral Reason:   Specialty Services Required    Number of Visits Requested:   1   All questions were answered. The patient knows to call the clinic with any problems, questions or concerns. No barriers to learning was detected. The total time spent in the appointment was 40 minutes.     Truitt Merle, MD 10/28/2022   Felicity Coyer, CMA, am acting as scribe for Truitt Merle, MD.   I have reviewed the above documentation for accuracy and completeness, and I agree with the above.

## 2022-10-27 ENCOUNTER — Telehealth: Payer: Self-pay

## 2022-10-27 MED FILL — Fosaprepitant Dimeglumine For IV Infusion 150 MG (Base Eq): INTRAVENOUS | Qty: 5 | Status: AC

## 2022-10-27 MED FILL — Dexamethasone Sodium Phosphate Inj 100 MG/10ML: INTRAMUSCULAR | Qty: 1 | Status: AC

## 2022-10-27 NOTE — Assessment & Plan Note (Signed)
-  secondary to cancer recurrence at abdominal wall  -on Norco as needed  -I added MS contin '15mg'$  q12h

## 2022-10-27 NOTE — Assessment & Plan Note (Signed)
-  on meds -will monitor closely when on chemo

## 2022-10-27 NOTE — Assessment & Plan Note (Signed)
-  diagnosed in 07/2021, s/p surgical resection  -she received adjuvant chemotherapy with Xeloda for 6 months.  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 -she started neoadjuvant chemo cisplatin, gemcitabine and durvalumab on 10/19/22, plan to have surgical resection after chemo

## 2022-10-27 NOTE — Telephone Encounter (Signed)
Faxed last office note with treatment plan as requested to Andria Meuse at [facility]

## 2022-10-28 ENCOUNTER — Inpatient Hospital Stay: Payer: Commercial Managed Care - HMO

## 2022-10-28 ENCOUNTER — Inpatient Hospital Stay (HOSPITAL_BASED_OUTPATIENT_CLINIC_OR_DEPARTMENT_OTHER): Payer: Commercial Managed Care - HMO | Admitting: Hematology

## 2022-10-28 ENCOUNTER — Ambulatory Visit: Payer: Commercial Managed Care - HMO | Admitting: Nutrition

## 2022-10-28 ENCOUNTER — Encounter: Payer: Self-pay | Admitting: Hematology

## 2022-10-28 ENCOUNTER — Other Ambulatory Visit: Payer: Self-pay

## 2022-10-28 VITALS — BP 129/50 | HR 85 | Temp 98.7°F | Resp 18 | Ht 64.0 in | Wt 156.6 lb

## 2022-10-28 DIAGNOSIS — C23 Malignant neoplasm of gallbladder: Secondary | ICD-10-CM | POA: Diagnosis not present

## 2022-10-28 DIAGNOSIS — T451X5A Adverse effect of antineoplastic and immunosuppressive drugs, initial encounter: Secondary | ICD-10-CM

## 2022-10-28 DIAGNOSIS — I10 Essential (primary) hypertension: Secondary | ICD-10-CM

## 2022-10-28 DIAGNOSIS — R110 Nausea: Secondary | ICD-10-CM

## 2022-10-28 DIAGNOSIS — R1010 Upper abdominal pain, unspecified: Secondary | ICD-10-CM

## 2022-10-28 DIAGNOSIS — Z5111 Encounter for antineoplastic chemotherapy: Secondary | ICD-10-CM | POA: Diagnosis not present

## 2022-10-28 LAB — CMP (CANCER CENTER ONLY)
ALT: 17 U/L (ref 0–44)
AST: 17 U/L (ref 15–41)
Albumin: 3.6 g/dL (ref 3.5–5.0)
Alkaline Phosphatase: 93 U/L (ref 38–126)
Anion gap: 9 (ref 5–15)
BUN: 9 mg/dL (ref 6–20)
CO2: 24 mmol/L (ref 22–32)
Calcium: 9.1 mg/dL (ref 8.9–10.3)
Chloride: 100 mmol/L (ref 98–111)
Creatinine: 0.58 mg/dL (ref 0.44–1.00)
GFR, Estimated: >60 mL/min (ref >60)
Glucose, Bld: 133 mg/dL — ABNORMAL HIGH (ref 70–99)
Potassium: 3.5 mmol/L (ref 3.5–5.1)
Sodium: 133 mmol/L — ABNORMAL LOW (ref 135–145)
Total Bilirubin: 0.7 mg/dL (ref 0.3–1.2)
Total Protein: 7 g/dL (ref 6.5–8.1)

## 2022-10-28 LAB — CBC WITH DIFFERENTIAL (CANCER CENTER ONLY)
Abs Immature Granulocytes: 0.02 10*3/uL (ref 0.00–0.07)
Basophils Absolute: 0 10*3/uL (ref 0.0–0.1)
Basophils Relative: 0 %
Eosinophils Absolute: 0.2 10*3/uL (ref 0.0–0.5)
Eosinophils Relative: 2 %
HCT: 32.4 % — ABNORMAL LOW (ref 36.0–46.0)
Hemoglobin: 10.9 g/dL — ABNORMAL LOW (ref 12.0–15.0)
Immature Granulocytes: 0 %
Lymphocytes Relative: 13 %
Lymphs Abs: 1.4 10*3/uL (ref 0.7–4.0)
MCH: 30.8 pg (ref 26.0–34.0)
MCHC: 33.6 g/dL (ref 30.0–36.0)
MCV: 91.5 fL (ref 80.0–100.0)
Monocytes Absolute: 0.9 10*3/uL (ref 0.1–1.0)
Monocytes Relative: 9 %
Neutro Abs: 8.1 10*3/uL — ABNORMAL HIGH (ref 1.7–7.7)
Neutrophils Relative %: 76 %
Platelet Count: 271 10*3/uL (ref 150–400)
RBC: 3.54 MIL/uL — ABNORMAL LOW (ref 3.87–5.11)
RDW: 11.9 % (ref 11.5–15.5)
WBC Count: 10.6 10*3/uL — ABNORMAL HIGH (ref 4.0–10.5)
nRBC: 0 % (ref 0.0–0.2)

## 2022-10-28 LAB — MAGNESIUM: Magnesium: 1.8 mg/dL (ref 1.7–2.4)

## 2022-10-28 MED ORDER — PALONOSETRON HCL INJECTION 0.25 MG/5ML
0.2500 mg | Freq: Once | INTRAVENOUS | Status: AC
  Administered 2022-10-28: 0.25 mg via INTRAVENOUS
  Filled 2022-10-28: qty 5

## 2022-10-28 MED ORDER — HEPARIN SOD (PORK) LOCK FLUSH 100 UNIT/ML IV SOLN
500.0000 [IU] | Freq: Once | INTRAVENOUS | Status: AC | PRN
  Administered 2022-10-28: 500 [IU]

## 2022-10-28 MED ORDER — POTASSIUM CHLORIDE IN NACL 20-0.9 MEQ/L-% IV SOLN
Freq: Once | INTRAVENOUS | Status: AC
  Filled 2022-10-28: qty 1000

## 2022-10-28 MED ORDER — SODIUM CHLORIDE 0.9% FLUSH
10.0000 mL | INTRAVENOUS | Status: DC | PRN
  Administered 2022-10-28: 10 mL

## 2022-10-28 MED ORDER — DEXAMETHASONE 4 MG PO TABS: 4.0000 mg | ORAL_TABLET | Freq: Two times a day (BID) | ORAL | 0 refills | Status: DC

## 2022-10-28 MED ORDER — MAGNESIUM SULFATE 2 GM/50ML IV SOLN
2.0000 g | Freq: Once | INTRAVENOUS | Status: AC
  Administered 2022-10-28: 2 g via INTRAVENOUS
  Filled 2022-10-28: qty 50

## 2022-10-28 MED ORDER — SODIUM CHLORIDE 0.9 % IV SOLN
25.0000 mg/m2 | Freq: Once | INTRAVENOUS | Status: AC
  Administered 2022-10-28: 46 mg via INTRAVENOUS
  Filled 2022-10-28: qty 46

## 2022-10-28 MED ORDER — SODIUM CHLORIDE 0.9% FLUSH
10.0000 mL | INTRAVENOUS | Status: DC | PRN
  Administered 2022-10-28: 10 mL via INTRAVENOUS

## 2022-10-28 MED ORDER — MORPHINE SULFATE ER 15 MG PO TBCR: 15.0000 mg | EXTENDED_RELEASE_TABLET | Freq: Two times a day (BID) | ORAL | 0 refills | Status: DC

## 2022-10-28 MED ORDER — SODIUM CHLORIDE 0.9 % IV SOLN: Freq: Once | INTRAVENOUS | Status: AC

## 2022-10-28 MED ORDER — SODIUM CHLORIDE 0.9 % IV SOLN
150.0000 mg | Freq: Once | INTRAVENOUS | Status: AC
  Administered 2022-10-28: 150 mg via INTRAVENOUS
  Filled 2022-10-28: qty 150

## 2022-10-28 MED ORDER — SODIUM CHLORIDE 0.9 % IV SOLN
10.0000 mg | Freq: Once | INTRAVENOUS | Status: AC
  Administered 2022-10-28: 10 mg via INTRAVENOUS
  Filled 2022-10-28: qty 10

## 2022-10-28 MED ORDER — SODIUM CHLORIDE 0.9 % IV SOLN
1000.0000 mg/m2 | Freq: Once | INTRAVENOUS | Status: AC
  Administered 2022-10-28: 1824 mg via INTRAVENOUS
  Filled 2022-10-28: qty 47.97

## 2022-10-28 NOTE — Progress Notes (Addendum)
Nutrition follow-up completed with patient and husband in the infusion room using [place] interpreter Cletus Gash.  Patient is receiving treatment for recurrent gallbladder cancer.  She is followed by Dr. Burr Medico.  Weight decreased to 156 pounds 10 ounces December 14.  This is down from 163 pounds December 5.  This is a 4% weight loss over 2 weeks.  Noted labs: Glucose 133 and sodium 133.  Patient reports she has been unable to eat much secondary to nausea.  States nausea has been daily.  Her nausea medication was not completely effective.  States she tolerates plantains and a cereal bar during episodes of nausea.  She also tolerated black beans with cheese and salsa during nausea.  MD has ordered additional nausea medication and explained how to take this in detail to patient.  Patient currently reports she feels well and has been eating since given nausea medications approximately 20 minutes ago.  Reports increased hunger.  She is asking her husband to purchase a [employer] sub for her.  She has chips at the table next to her.  She denies other nutrition impact symptoms.  States she takes MiraLAX daily. Patient has Ensure at home and is drinking occasionally.  Nutrition diagnosis: Food and nutrition related knowledge deficit, ongoing.  Intervention: Recommended patient choose bland foods every 2-3 hours during episodes of nausea.  Encouraged her to take nausea medication as explained by MD. Encouraged her to consume high-protein foods more often throughout the day. Reminded patient to refer back to booklet given on nutrition for People with cancer in Spanish. Encouraged patient to drink Ensure plus or equivalent at least once daily.  Monitoring, evaluation, goals: Patient will tolerate increased calories and protein with minimal nausea to minimize further weight loss.  Next visit: To be scheduled as needed.

## 2022-10-28 NOTE — Patient Instructions (Signed)
Instrucciones al darle de alta: Discharge Instructions Gracias por elegir al [facility] de Cncer de [place] para brindarle atencin mdica de oncologa y Music therapist.   Si usted tiene una cita de laboratorio con [place], por favor vaya directamente [place] y regstrese en el rea de Control and instrumentation engineer.   Use ropa cmoda y [place] para tener fcil acceso a las vas del Portacath (acceso venoso de Engineer, site duracin) o la lnea PICC (catter central colocado por va perifrica).   Nos esforzamos por ofrecerle tiempo de calidad con su proveedor. Es posible que tenga que volver a programar su cita si llega tarde (15 minutos o ms).  El llegar tarde le afecta a usted y a otros pacientes cuyas citas son posteriores a Merchandiser, retail.  Adems, si usted falta a tres o ms citas sin avisar a la oficina, puede ser retirado(a) de la clnica a discrecin del proveedor.      Para las solicitudes de renovacin de recetas, pida a su farmacia que se ponga en contacto con nuestra oficina y deje que transcurran 71 horas para que se complete el proceso de las renovaciones.    Hoy usted recibi los siguientes agentes de quimioterapia e/o inmunoterapia: gemcitabine and cisplatin      Para ayudar a prevenir las nuseas y los vmitos despus de su tratamiento, le recomendamos que tome su medicamento para las nuseas segn las indicaciones.  LOS SNTOMAS QUE DEBEN COMUNICARSE INMEDIATAMENTE SE INDICAN A CONTINUACIN: *FIEBRE SUPERIOR A 100.4 F (38 C) O MS *ESCALOFROS O SUDORACIN *NUSEAS Y VMITOS QUE NO SE CONTROLAN CON EL MEDICAMENTO PARA LAS NUSEAS *DIFICULTAD INUSUAL PARA RESPIRAR  *MORETONES O HEMORRAGIAS NO HABITUALES *PROBLEMAS URINARIOS (dolor o ardor al Garment/textile technologist o frecuencia para Garment/textile technologist) *PROBLEMAS INTESTINALES (diarrea inusual, estreimiento, dolor cerca del ano) SENSIBILIDAD EN LA BOCA Y EN LA GARGANTA CON O SIN LA PRESENCIA DE LCERAS (dolor de garganta, llagas en la boca o dolor de  muelas/dientes) ERUPCIN, HINCHAZN O DOLORES INUSUALES FLUJO VAGINAL INUSUAL O PICAZN/RASQUIA    Los puntos marcados con un asterisco ( *) indican una posible emergencia y debe hacer un seguimiento tan pronto como le sea posible o vaya al Departamento de Emergencias si se le presenta algn problema.  Por favor, muestre la [place] DE ADVERTENCIA DE Windy Canny DE ADVERTENCIA DE Benay Spice al registrarse en [address] de Emergencias y a la enfermera de triaje.  Si tiene preguntas despus de su visita o necesita cancelar o volver a programar su cita, por favor pngase en contacto con [place]  Dept: [phone] y [place]. Las horas de oficina son de 8:00 a.m. a 4:30 p.m. de lunes a viernes. Por favor, tenga en cuenta que los mensajes de voz que se dejan despus de las 4:00 p.m. posiblemente no se devolvern hasta el siguiente da de [place].  Cerramos los fines de semana y [employer]. En todo momento tiene acceso a una enfermera para preguntas urgentes. Por favor, llame al nmero principal de la clnica Dept: [phone] y [place] instrucciones.   Para cualquier pregunta que no sea de carcter urgente, tambin puede ponerse en contacto con su proveedor [employer]. Ahora ofrecemos visitas electrnicas para cualquier persona mayor de 18 aos que solicite atencin mdica en lnea para los sntomas que no sean urgentes. Para ms detalles vaya a [email or web address].   Tambin puede bajar la aplicacin de MyChart! Vaya a la tienda de aplicaciones, busque "MyChart", abra la  aplicacin, seleccione [place], e ingrese con su nombre de usuario y la contrasea de Pharmacist, community.  Las mscaras son opcionales en los centros de Hotel manager. Si desea que su equipo de cuidados mdicos use una [employer] atienden, por favor hgaselo saber al personal. Bethann Berkshire una persona de apoyo que tenga por lo menos 16 aos  para que le acompae a sus citas.

## 2022-10-28 NOTE — Assessment & Plan Note (Signed)
-  Patient has had a intermittent nausea since last chemo, I discussed antiemetics extensively with the patient and wrote down the instruction for her -Will add dexamethasone 4 mg daily for 3 to 5 days after chemo, prescription called in today.

## 2022-10-29 ENCOUNTER — Other Ambulatory Visit: Payer: Self-pay

## 2022-10-30 ENCOUNTER — Telehealth: Payer: Self-pay | Admitting: Hematology

## 2022-10-30 NOTE — Telephone Encounter (Signed)
Spoke with patient confirming 12/18 appointments

## 2022-10-31 NOTE — Progress Notes (Unsigned)
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 Cholelithiasis with distended gallbladder. No CT evidence of acute cholecystitis. Consider further evaluation with right upper quadrant ultrasound as clinically indicated. 2. Punctate nonobstructive left nephrolithiasis. 3. Aortic Atherosclerosis (ICD10-I70.0).   07/21/2021 Imaging   ABD [place] RUQ IMPRESSION: 1. Cholelithiasis without evidence of acute cholecystitis. 2. Mild hepatic steatosis.     07/21/2021 Imaging   MRA/MRCP IMPRESSION: 1. No biliary ductal dilation. No choledocholithiasis. 2. Mild hepatic steatosis. 3. Cholelithiasis with dilation of the gallbladder and trace pericholecystic fluid. No gallbladder wall thickening or abnormal wall enhancement. Findings which are equivocal for acute cholecystitis. Consider further evaluation with nuclear medicine HIDA scan to assess cystic duct patency if clinically indicated.   07/22/2021 Imaging   HIDA SCAN IMPRESSION: Scintigraphic findings most consistent with acute cholecystitis.   07/23/2021 Surgery   Preop Dx:        Acute cholecystitis Postop Dx:      Acute cholecystitis with 1 cm stone impacted in the infundibulum Procedure:      Xi robotic cholecystectomy with ICG By Dr. Johnathan Hausen   07/23/2021 Pathology Results   FINAL MICROSCOPIC DIAGNOSIS:  A. GALLBLADDER, CHOLECYSTECTOMY:  - Invasive poorly differentiated adenocarcinoma, 4.3 cm, involving  gallbladder neck  -  Carcinoma invades perimuscular soft tissue  - Cystic duct margin is focally positive for carcinoma  - Focally suspicious for lymphovascular invasion   Procedure: Cholecystectomy  Tumor Site: Gallbladder neck  Tumor Size: 4.3 cm  Histologic Type: Adenocarcinoma  Histologic Grade: G3: Poorly differentiated  Tumor Extension: Tumor invades perimuscular connective tissue on the peritoneal side without serosal involvement  Margins:       Margin Status for Invasive Carcinoma: Cystic duct margin is positive for carcinoma  Regional Lymph Nodes: Not applicable (no lymph nodes submitted or found)  Distant Metastasis:       Distant Site(s) Involved: Not applicable  Pathologic Stage Classification (pTNM, AJCC 8th Edition): pT2a, pN not assigned    08/20/2021 Imaging   CT chest IMPRESSION: Negative. No CT evidence for acute intrathoracic abnormality. Negative for pulmonary nodule or evidence for metastatic disease to the chest.   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)   09/04/2021 Definitive Surgery   Procedure: by Dr. Michaelle Birks Staging laparoscopy Exploratory laparotomy with intraoperative cholangiogram Excision of new cystic duct margin Partial central hepatectomy (resection of gallbladder fossa - segments 4b and 5) Portal lymph node dissection   09/04/2021 Pathology Results   FINAL MICROSCOPIC DIAGNOSIS:  A. CYSTIC DUCT, NEW MARGIN, EXCISION:  -  No adenocarcinoma identified  B. LYMPH NODE, PORTAL, EXCISION:  -  Nodular fat necrosis and fibrosis  -  No nodal tissue identified  C. GALLBLADDER, FOSSA, EXCISION:  -  Benign liver with periductular chronic inflammation and  macrovesicular steatosis  -  No adenocarcinoma identified  D. LYMPH NODE, STATION EIGHT, EXCISION:  -  No adenocarcinoma identified in one lymph node (0/1)    09/04/2021 Cancer Staging  Staging form: Gallbladder, AJCC 8th Edition -  Clinical stage from 09/04/2021: Stage IIA (cT2a, cN0, cM0) - Signed by Alla Feeling, NP on 09/21/2021 Stage prefix: Initial diagnosis Total positive nodes: 0 Histologic grade (G): G3 Histologic grading system: 3 grade system Histologic sub-type: Adenocarcinoma   09/28/2021 - 09/28/2021 Chemotherapy   Patient is on Treatment Plan : BREAST Capecitabine q21d     09/30/2022 Relapse/Recurrence   FINAL MICROSCOPIC DIAGNOSIS:  A. 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  It is composed of solid sheets and nests of atypical cells with variably enlarged round to oval irregular nuclei and a variable amount of eosinophilic focally vacuolated cytoplasm, and these sheets and nests are admixed with infiltrating irregular angulated glands lined by an atypical columnar epithelium composed of similar-appearing cells.  Glandular lumens contain mucin.  Focally these epithelia are contiguous and are present within the same sheet and/or gland. Five immunohistochemical stains performed with adequate control. The tumor is positive for cytokeratin 7 and the solid sheets and nests are positive for the squamous marker p40.  The tumor is negative for cytokeratin 20.  The tumor is also negative for the GI marker CDX2.  The tumor is negative for the hepatic marker arginase.  The prior cholecystectomy specimen ([id number]) is reviewed and this carcinoma shows identical cytohistomorphology as the previously diagnosed gallbladder adenocarcinoma.    10/01/2022 PET scan   IMPRESSION: 1. Progressive soft tissue mass involving the right lower quadrant ventral abdominal wall is intensely FDG avid compatible with recurrent tumor. 2. No signs of FDG avid nodal or solid organ metastasis. 3. Multiple fat containing ventral  abdominal wall hernias. 4.  Aortic Atherosclerosis (ICD10-I70.0).   10/12/2022 - 10/12/2022 Chemotherapy   Patient is on Treatment Plan : BILIARY TRACT Cisplatin + Gemcitabine D1,8 q21d     10/12/2022 - 10/12/2022 Chemotherapy   Patient is on Treatment Plan : BLADDER Durvalumab (10) q14d     10/19/2022 -  Chemotherapy   Patient is on Treatment Plan : BILIARY TRACT Cisplatin + Gemcitabine D1,8 q21d     10/19/2022 -  Chemotherapy   Patient is on Treatment Plan : BLADDER Durvalumab (10) q14d       CURRENT THERAPY: Gemcitabine and cisplatin days 1, 8 q21 days, plus durvalumab q21 days  INTERVAL HISTORY: Ms. Mourer returns for symptom management visit as scheduled following C1D8 gem/cis on 12/14.  At last visit she was prescribed MS contin for pain and was given dex for nausea but she has not picked up either. Emend was added, nausea has improved but not resolved. Still nauseous when she eats and has little appetite. RLQ pain is constant, takes norco q6 which helps but pain never resolves. Bowels moving slowly, she had to strain and used miralax which helped. Pain is her main issue. She is tired, up at home some but resting a lot.   All other systems were reviewed with the patient and are negative.  MEDICAL HISTORY:  Past Medical History:  Diagnosis Date   Cancer [facility])    Hypertension     SURGICAL HISTORY: Past Surgical History:  Procedure Laterality Date   CHOLECYSTECTOMY  07/23/2021   INTRAOPERATIVE CHOLANGIOGRAM N/A 09/04/2021   Procedure: INTRAOPERATIVE CHOLANGIOGRAM;  Surgeon: Dwan Bolt, MD;  Location: [place];  Service: General;  Laterality: N/A;   LAPAROSCOPY N/A 09/04/2021   Procedure: STAGING LAPAROSCOPY;  Surgeon: Dwan Bolt, MD;  Location: [place];  Service: General;  Laterality: N/A;   LYMPH NODE DISSECTION N/A 09/04/2021   Procedure: PORTAL LYMPH NODE DISSECTION;  Surgeon: Dwan Bolt, MD;  Location: [place];  Service: General;  Laterality: N/A;   OPEN  HEPATECTOMY  N/A 09/04/2021   Procedure: OPEN PARTIAL CENTRAL HEPATECTOMY;  Surgeon: Dwan Bolt, MD;  Location: [place];  Service: General;  Laterality: N/A;   PORTACATH PLACEMENT Right 10/15/2022   Procedure: INSERTION PORT-A-CATH;  Surgeon: Dwan Bolt, MD;  Location: [place];  Service: General;  Laterality: Right;    I have reviewed the social history and family history with the patient and they are unchanged from previous note.  ALLERGIES:  has No Known Allergies.  MEDICATIONS:  Current Outpatient Medications  Medication Sig Dispense Refill   atorvastatin (LIPITOR) 40 MG tablet Take 40 mg by mouth daily.     dexamethasone (DECADRON) 4 MG tablet Take 1 tablet (4 mg total) by mouth 2 (two) times daily with a meal. 30 tablet 0   hydrochlorothiazide (MICROZIDE) 12.5 MG capsule Take 12.5 mg by mouth daily.     HYDROcodone-acetaminophen (NORCO/VICODIN) 5-325 MG tablet Take 1 tablet by mouth every 6 (six) hours as needed for severe pain. (Patient taking differently: Take 1 tablet by mouth every 6 (six) hours.) 90 tablet 0   lidocaine-prilocaine (EMLA) cream Apply to affected area once (Patient taking differently: Apply 1 Application topically as needed. Apply to affected area once) 30 g 3   lisinopril (ZESTRIL) 10 MG tablet Take 1 tablet (10 mg total) by mouth daily. 30 tablet 0   morphine (MS CONTIN) 15 MG 12 hr tablet Take 1 tablet (15 mg total) by mouth every 12 (twelve) hours. 30 tablet 0   ondansetron (ZOFRAN) 8 MG tablet Take 1 tablet (8 mg total) by mouth every 8 (eight) hours as needed for nausea or vomiting. Start on the third day after cisplatin. 30 tablet 1   prochlorperazine (COMPAZINE) 10 MG tablet Take 1 tablet (10 mg total) by mouth every 6 (six) hours as needed (Nausea or vomiting). 30 tablet 1   urea (CARMOL) 10 % cream Apply topically to hands and feet 2 to 3 times daily as needed 71 g 2   acetaminophen (TYLENOL) 325 MG tablet Take 2 tablets (650 mg total) by mouth every 6  (six) hours as needed for mild pain or fever. (Patient not taking: Reported on 10/14/2022)     No current facility-administered medications for this visit.   Facility-Administered Medications Ordered in Other Visits  Medication Dose Route Frequency Provider Last Rate Last Admin   0.9 %  sodium chloride infusion   Intravenous Once Alla Feeling, NP 500 mL/hr at 11/01/22 1323 New Bag at 11/01/22 1323    PHYSICAL EXAMINATION: ECOG PERFORMANCE STATUS: 1-2  Vitals:   11/01/22 1151  BP: (!) 146/77  Pulse: 98  Resp: 16  Temp: 97.6 F (36.4 C)  SpO2: 99%   Filed Weights   11/01/22 1151  Weight: 155 lb (70.3 kg)    GENERAL:alert, no distress and comfortable SKIN: no rash  EYES: sclera clear LUNGS: clear with normal breathing effort HEART: regular rate & rhythm, no lower extremity edema ABDOMEN:abdomen soft, non-tender and normal bowel sounds. Fullness in the RLQ without palpable mass NEURO: alert & oriented x 3 with fluent speech PAC without erythema   LABORATORY DATA:  I have reviewed the data as listed    Latest Ref Rng &  Units 11/01/2022   11:31 AM 10/28/2022    7:46 AM 10/19/2022    7:57 AM  CBC  WBC 4.0 - 10.5 K/uL 8.2  10.6  12.4   Hemoglobin 12.0 - 15.0 g/dL 11.1  10.9  11.7   Hematocrit 36.0 - 46.0 % 33.2  32.4  35.2   Platelets 150 - 400 K/uL 365  271  476         Latest Ref Rng & Units 11/01/2022   11:31 AM 10/28/2022    7:46 AM 10/19/2022    7:57 AM  CMP  Glucose 70 - 99 mg/dL 143  133  121   BUN 6 - 20 mg/dL '14  9  11   '$ Creatinine 0.44 - 1.00 mg/dL 0.64  0.58  0.59   Sodium 135 - 145 mmol/L 134  133  136   Potassium 3.5 - 5.1 mmol/L 3.8  3.5  3.6   Chloride 98 - 111 mmol/L 101  100  101   CO2 22 - 32 mmol/L '25  24  26   '$ Calcium 8.9 - 10.3 mg/dL 9.3  9.1  9.6   Total Protein 6.5 - 8.1 g/dL 7.1  7.0  7.6   Total Bilirubin 0.3 - 1.2 mg/dL 0.9  0.7  1.2   Alkaline Phos 38 - 126 U/L 90  93  76   AST 15 - 41 U/L '18  17  18   '$ ALT 0 - 44 U/L '22  17  18        '$ RADIOGRAPHIC STUDIES: I have personally reviewed the radiological images as listed and agreed with the findings in the report. No results found.   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 Zenia Resides 09/04/2021  -S/p 6 months adjuvant chemotherapy with Xeloda -She developed abdominal pain, bx abd wall mass 09/20/22 confirmed metastatic recurrence of gallbladder cancer  -PET scan 10/01/22 showed no other areas of hypermetabolism.  -She had port placed 12/1 began first line systemic chemotherapy with cisplatin and gemcitabine on days 1, 8 w21 days and durvalumab q21 days on 10/19/22 -Ms. Gullickson appears stable. S/p cycle 1 day 8 10/28/22, she tolerated moderately well with fatigue, low appetite, and nausea. She feels most of her symptoms are related to right abdominal pain which is not well controlled. She has not started MS contin yet, we reviewed instructions and she will start today. She will continue norco PRN for breakthrough. I recommend stool softener for constipation.  -N/v improved with Emend. She did not pick up dex but this has improved. I recommend anti-nausea 30 minutes before meals. She can pick up dex but hold for cycle 2.  -I encouraged her to increase po, use supplements, and continue f/up wth dietician.  -labs reviewed. She will receive IVF and IV pain medication if needed today in clinic.  -F/up 12/28 for C2 D1 as scheduled.    Abdominal pain, transaminitis, hyperbilirubinemia -secondary to #1 -Transaminitis and elevated bilirubin are intermittent -she developed RLQ pain with recurrence, mild - moderate. Takes norco q5-6 hours which is helpful -Pain remains not well controlled. She will start MS contin tonight. Continue stool softener/miralax for constipation   3.  Hypertension -continue meds and PCP f/up    PLAN: -IVF with pain meds today -Reviewed symptom management for n/v, pain, low appetite, fatigue -She will pick up MS  contin from the pharmacy today and start -F/u pand C2 D1 12/28 as scheduled   All questions were answered. The patient knows to call the clinic with any problems, questions or concerns. No barriers to learning was detected. I spent 20 minutes counseling the patient face to face. The total time spent in the appointment was 30 minutes and more than 50% was on counseling and review of test results     Alla Feeling, NP 11/01/22

## 2022-11-01 ENCOUNTER — Inpatient Hospital Stay (HOSPITAL_BASED_OUTPATIENT_CLINIC_OR_DEPARTMENT_OTHER): Payer: Commercial Managed Care - HMO | Admitting: Nurse Practitioner

## 2022-11-01 ENCOUNTER — Inpatient Hospital Stay: Payer: Commercial Managed Care - HMO

## 2022-11-01 ENCOUNTER — Encounter: Payer: Self-pay | Admitting: Nurse Practitioner

## 2022-11-01 VITALS — BP 146/77 | HR 98 | Temp 97.6°F | Resp 16 | Wt 155.0 lb

## 2022-11-01 DIAGNOSIS — C23 Malignant neoplasm of gallbladder: Secondary | ICD-10-CM

## 2022-11-01 DIAGNOSIS — Z5111 Encounter for antineoplastic chemotherapy: Secondary | ICD-10-CM | POA: Diagnosis not present

## 2022-11-01 DIAGNOSIS — Z95828 Presence of other vascular implants and grafts: Secondary | ICD-10-CM

## 2022-11-01 LAB — CBC WITH DIFFERENTIAL (CANCER CENTER ONLY)
Abs Immature Granulocytes: 0.03 10*3/uL (ref 0.00–0.07)
Basophils Absolute: 0 10*3/uL (ref 0.0–0.1)
Basophils Relative: 0 %
Eosinophils Absolute: 0.1 10*3/uL (ref 0.0–0.5)
Eosinophils Relative: 1 %
HCT: 33.2 % — ABNORMAL LOW (ref 36.0–46.0)
Hemoglobin: 11.1 g/dL — ABNORMAL LOW (ref 12.0–15.0)
Immature Granulocytes: 0 %
Lymphocytes Relative: 13 %
Lymphs Abs: 1.1 10*3/uL (ref 0.7–4.0)
MCH: 30.8 pg (ref 26.0–34.0)
MCHC: 33.4 g/dL (ref 30.0–36.0)
MCV: 92.2 fL (ref 80.0–100.0)
Monocytes Absolute: 0.1 10*3/uL (ref 0.1–1.0)
Monocytes Relative: 1 %
Neutro Abs: 6.9 10*3/uL (ref 1.7–7.7)
Neutrophils Relative %: 85 %
Platelet Count: 365 10*3/uL (ref 150–400)
RBC: 3.6 MIL/uL — ABNORMAL LOW (ref 3.87–5.11)
RDW: 12.3 % (ref 11.5–15.5)
WBC Count: 8.2 10*3/uL (ref 4.0–10.5)
nRBC: 0 % (ref 0.0–0.2)

## 2022-11-01 LAB — CMP (CANCER CENTER ONLY)
ALT: 22 U/L (ref 0–44)
AST: 18 U/L (ref 15–41)
Albumin: 3.7 g/dL (ref 3.5–5.0)
Alkaline Phosphatase: 90 U/L (ref 38–126)
Anion gap: 8 (ref 5–15)
BUN: 14 mg/dL (ref 6–20)
CO2: 25 mmol/L (ref 22–32)
Calcium: 9.3 mg/dL (ref 8.9–10.3)
Chloride: 101 mmol/L (ref 98–111)
Creatinine: 0.64 mg/dL (ref 0.44–1.00)
GFR, Estimated: >60 mL/min (ref >60)
Glucose, Bld: 143 mg/dL — ABNORMAL HIGH (ref 70–99)
Potassium: 3.8 mmol/L (ref 3.5–5.1)
Sodium: 134 mmol/L — ABNORMAL LOW (ref 135–145)
Total Bilirubin: 0.9 mg/dL (ref 0.3–1.2)
Total Protein: 7.1 g/dL (ref 6.5–8.1)

## 2022-11-01 MED ORDER — SODIUM CHLORIDE 0.9 % IV SOLN: Freq: Once | INTRAVENOUS | Status: AC

## 2022-11-01 MED ORDER — HEPARIN SOD (PORK) LOCK FLUSH 100 UNIT/ML IV SOLN
500.0000 [IU] | Freq: Once | INTRAVENOUS | Status: AC
  Administered 2022-11-01: 500 [IU] via INTRAVENOUS

## 2022-11-01 MED ORDER — SODIUM CHLORIDE 0.9% FLUSH
10.0000 mL | INTRAVENOUS | Status: AC | PRN
  Administered 2022-11-01: 10 mL

## 2022-11-01 MED ORDER — SODIUM CHLORIDE 0.9% FLUSH
10.0000 mL | Freq: Once | INTRAVENOUS | Status: AC
  Administered 2022-11-01: 10 mL via INTRAVENOUS

## 2022-11-01 MED ORDER — HEPARIN SOD (PORK) LOCK FLUSH 100 UNIT/ML IV SOLN
500.0000 [IU] | INTRAVENOUS | Status: AC | PRN
  Administered 2022-11-01: 500 [IU]

## 2022-11-01 NOTE — Patient Instructions (Signed)
Rehidratacin en los adultos Rehydration, Adult La rehidratacin es la reposicin de lquidos, sales y minerales del cuerpo (electrolitos) que se pierden Biochemist, clinical. La deshidratacin se da cuando hay una cantidad insuficiente de agua u otros lquidos en el organismo. Esto ocurre cuando se pierden ms lquidos de los que se ingieren. [place] causas frecuentes de deshidratacin, se incluyen: No beber la cantidad suficiente de lquidos. Esto puede suceder cuando se enferma o hace actividades que requieren mucha energa, especialmente cuando hace calor. Afecciones que causan prdida de agua u otros lquidos. Estas incluyen diarrea, vmitos, sudoracin y [place]. Otras enfermedades, como fiebre o infeccin. Determinados medicamentos, como aquellos que eliminan el exceso de lquido del cuerpo (diurticos). Los sntomas de la deshidratacin leve o moderada pueden incluir sed, sequedad de los labios y de la boca, y Tree surgeon. Los sntomas de la deshidratacin grave pueden incluir aumento de la frecuencia cardaca, confusin, desmayos e imposibilidad de Garment/textile technologist. En casos graves, es posible que deba recibir lquidos por va intravenosa en el hospital. En casos leves o moderados, generalmente puede rehidratarse en su casa tomando ciertos lquidos que le haya indicado el mdico. Naschitti son los riesgos? El [employer] El [employer] Inc. Esto puede incluir tomar demasiado lquido (sobrehidratacin). Esto es poco frecuente. La sobrehidratacin puede causar un desequilibrio de los electrolitos, insuficiencia renal o una disminucin de los niveles de sal (sodio) en el cuerpo. Materiales necesarios: Designer, industrial/product una solucin de rehidratacin oral (SRO) si el mdico se lo indica. Se trata de una bebida que es para tratar la deshidratacin. Se puede conseguir en farmacias y tiendas. Cmo rehidratarse [place] instrucciones del mdico respecto de  qu beber. El tipo y la cantidad de lquido que debe beber dependen de su afeccin. En general, debe elegir las bebidas que prefiera. Si se lo indic el mdico, tome una SRO. Para preparar una SRO, siga las instrucciones del envase. Comience por beber pequeas cantidades, aproximadamente  taza (120 ml) cada 5 a 10 minutos. Aumente lentamente la cantidad que bebe hasta que haya ingerido la cantidad recomendada por el mdico. Beba suficiente lquidos transparentes como para mantener la orina de color amarillo plido. Si le indicaron que beba una SRO, termnela primero y [place] [employer] a beber lentamente otros lquidos transparentes. Beba lquidos, por ejemplo: Agua. Esto incluye agua con gas y agua saborizada. Si bebe solo agua, puede disminuir mucho la cantidad de sodio en el cuerpo (hiponatremia). Siga el consejo del mdico. Agua de trocitos de hielo que usted succiona. Jugo de frutas con agua agregada (diluido). Bebidas deportivas. Ts de hierbas calientes o fros. Sopas a base de caldo. Leche o productos lcteos. [place] instrucciones del mdico respecto de lo que puede comer [place] se Electrical engineer. El mdico puede recomendarle que comience a comer despacio alimentos habituales en pequeas cantidades. Consuma los alimentos que contienen un equilibrio saludable de Brewing technologist, como las bananas, las [place], las papas, los tomates y Nurse, mental health. Evite los alimentos grasos y muy azucarados. En algunos casos, puede alimentarse a travs de una sonda de alimentacin que se coloca a travs de la nariz hasta el estmago (sonda nasogstrica o sonda NG). Esto puede hacerse si tiene vmitos o diarrea que no pueden controlarse. Bebidas que se deben evitar  Ciertas bebidas pueden empeorar la deshidratacin. Mientras se rehidrata, evite consumir alcohol. Cmo saber si se est recuperando de la deshidratacin Es posible que est mejorando si: Orina con ms frecuencia que antes de  comenzar la  rehidratacin. Su orina es de [employer] plido. Mejora el nivel de [place]. Vomita con menos frecuencia. Tiene diarrea con menos frecuencia. El apetito mejora o vuelve a la normalidad. Se siente menos mareado o menos aturdido. El color y el tono de la piel comienzan a lucir ms normales. Siga estas instrucciones en su casa: Use los medicamentos de venta libre y los recetados solamente como se lo haya indicado el mdico. No tome comprimidos de sodio. Hacer esto puede aumentar la concentracin de sodio en el organismo (hipernatremia). Comunquese con un mdico si: Contina teniendo sntomas de deshidratacin leve o moderada, por ejemplo: Sed. Labios secos. Sequedad leve en la boca. Mareos. Orina de color oscuro o menos orina que lo normal. Calambres musculares. Sigue con vmitos o diarrea. Solicite ayuda de inmediato si: Tiene sntomas de deshidratacin que empeoran. Tiene fiebre. Tiene un dolor de cabeza intenso. Ha estado vomitando y tiene problemas, por ejemplo: Los vmitos empeoran o no desaparecen. El vmito tiene sangre o una sustancia verde (bilis). No puede comer ni beber sin vomitar. Tiene problemas con la miccin o las deposiciones, por ejemplo: Diarrea que empeora o que no desaparece. Sangre en la materia fecal (heces). Esto puede hacer que la materia fecal sea negra y de aspecto alquitranado. No orina u orina solamente una pequea cantidad de color muy oscuro en el trmino de 6 a 8 horas. Tiene dificultad para respirar. Tiene sntomas que empeoran con [employer]. Estos sntomas pueden Sales executive. Solicite ayuda de inmediato. Llame al 911. No espere a ver si los sntomas desaparecen. No conduzca por sus propios medios [employer]. Esta informacin no tiene Marine scientist el consejo del mdico. Asegrese de hacerle al mdico cualquier pregunta que tenga. Document Revised: 04/13/2022 Document Reviewed: 04/13/2022 Elsevier Patient Education   [place].  Deshidratacin en los adultos Dehydration, Adult La deshidratacin es una afeccin que se caracteriza por una cantidad insuficiente de agua u otros lquidos en el organismo. Esto sucede cuando una persona pierde ms lquidos de los que consume. Las partes importantes del cuerpo no pueden funcionar correctamente sin una cantidad [place] de lquidos. Cualquier prdida de lquidos del organismo puede causar deshidratacin. La deshidratacin puede ser leve, grave o muy grave. Debe tratarse de inmediato para evitar que sea muy grave. Cules son las causas? Esta afeccin puede ser causada por lo siguiente: Afecciones que [employer] pierda agua u otros lquidos, por ejemplo: Materia fecal lquida (diarrea). Vmitos. Sudoracin abundante. Hacer mucho pis (orinar). No beber suficientes lquidos, especialmente cuando: Usted est enfermo. Hace cosas que requieren [employer]. Otras enfermedades y afecciones, como fiebre o infeccin. Determinados medicamentos, como aquellos que eliminan el exceso de lquido del organismo (diurticos). Falta de agua potable segura. No poder obtener suficiente agua y alimentos. Qu incrementa el riesgo? Los siguientes factores pueden hacer que sea ms propenso a Armed forces training and education officer afeccin: Tener una enfermedad prolongada (crnica) que no se ha tratado de Estate agent, como: Diabetes. Enfermedad cardaca. Enfermedad renal. Ser mayor de 7 aos de edad. Tener una discapacidad. Vivir en un lugar alto con respecto al suelo o al nivel del mar (de gran altitud). El aire menos denso y ms seco causa ms prdida de lquidos. Hacer ejercicios que sobrecargan el cuerpo [employer]. Cules son los signos o sntomas? Los sntomas de deshidratacin dependen de su gravedad. Deshidratacin leve o grave Sed. Sequedad en los labios o la boca. Sensacin de [employer] o desvanecimiento, especialmente al ponerse de pie despus  de estar  sentado. Calambres musculares. Su cuerpo produce: Pis (orina) de color oscuro. Orina que puede tener el color del t. Orina menos que lo normal. Tiene menos lgrimas que lo normal. Dolor de [place]. Deshidratacin muy grave Cambios en la piel. La piel puede: Estar fra al tacto (pegajosa). Tener manchas o estar plida. No volver a la normalidad de inmediato despus de pellizcarla ligeramente y soltarla. Escasa produccin o ausencia de lgrimas, orina o sudor. Cambios en los signos vitales, por ejemplo: Respiracin acelerada. Presin arterial baja. Pulso dbil. Pulso que supera los 100 latidos por minuto cuando est sentado y [place]. Otros cambios, por ejemplo: Sentir mucha sed. Ojos que se ven huecos (hundidos). Manos y pies fros. Estar desorientado (confundido). Estar muy cansado (aletargado) o tener problemas para despertarse. Prdida de peso a Control and instrumentation engineer. Prdida de la conciencia. Cmo se trata? El tratamiento de esta afeccin depende de su gravedad. El tratamiento debe comenzar de inmediato. No espere hasta que su afeccin sea muy grave. La deshidratacin muy grave es Engineer, maintenance (IT). Tendr que ir al hospital. La deshidratacin leve o grave puede tratarse en la casa. Le pedirn que: Beba ms lquidos. Beba una solucin de rehidratacin oral (SRO). Esta bebida ayuda a reponer las cantidades correctas de lquidos y de sales y Optometrist en la sangre (electrolitos). La deshidratacin muy grave puede tratarse: Con lquidos a travs de un tubo (catter) intravenoso. Restableciendo los [employer] de sales y Public librarian. A menudo, para esto, se administran sales y [employer] a travs de un tubo. El tubo se introduce a travs de la nariz [employer]. Tratando el origen del problema. Siga estas instrucciones en su casa: Solucin de rehidratacin oral Si se lo indica el mdico, beba una SRO: Prepare una SRO. Use las instrucciones del envase. Comience por beber  pequeas cantidades, aproximadamente  taza (120 ml) cada 5 a 10 minutos. Bonnita Nasuti de a Firefighter a la cantidad indicada por el mdico. Comida y bebida        Beba suficiente lquido transparente como para mantener la orina de color amarillo plido. Si le indicaron que tome una SRO, termine primero la solucin. Luego, comience a beber lentamente otros lquidos transparentes. Beba lquidos, por ejemplo: Agua. No beba solamente agua. Esto puede [employer] niveles de sal (sodio) en el organismo bajen demasiado. Agua de trocitos de hielo que usted succiona. Jugo de frutas con agregado de agua (diluido). Bebidas deportivas de bajas caloras. Consuma alimentos que contengan la cantidad [place] de sales y [place], por ejemplo: Bananas. [place]. Papas. Tomates. [place]. No beba alcohol. Evite lo siguiente: Bebidas que contengan gran cantidad de azcar. Esto incluye lo siguiente: Bebidas deportivas ricas en caloras. Jugo de frutas sin agregado de agua. Gaseosas. Cafena. Alimentos grasos o que contienen mucha grasa o Location manager. Instrucciones generales Use los medicamentos de venta libre y los recetados solamente como se lo haya indicado el mdico. No tome comprimidos de sal. Esto puede hacer que los niveles de sal en el cuerpo suban demasiado. Retome sus actividades normales segn lo indicado por el mdico. Pregntele al mdico qu actividades son seguras para usted. Concurra a todas las visitas de seguimiento como se lo haya indicado el mdico. Esto es importante. Comunquese con un mdico si: Tiene dolor de vientre (abdomen) y el dolor: Empeora. Permanece en un solo Environmental consultant. Tiene una erupcin cutnea. Presenta rigidez en el cuello. Se siente enojado o molesto (irritable) con ms facilidad de lo normal. Est ms cansado o le  cuesta despertarse ms de lo normal. Presenta los siguientes sntomas: Debilidad o mareos. Mucha sed. Solicite ayuda inmediatamente si tiene: Algn  sntoma de deshidratacin muy grave. Sntomas de vmitos, por ejemplo: No puede comer ni beber sin vomitar. Los vmitos empeoran o no desaparecen. Su vmito tiene sangre o una sustancia verde. Sntomas que empeoran con [employer]. Cristy Hilts. Dolor de cabeza muy intenso. Problemas para orinar o para defecar (tener deposiciones), como: Heces acuosas que empeoran o que no desaparecen. Sangre en las heces (deposiciones). Esto puede hacer que la materia fecal sea negra y tenga aspecto alquitranado. No orinar durante 6 a 8 horas. Hacer una cantidad pequea de orina muy oscura en el trmino de 6 a 8 horas. Dificultad para respirar. Estos sntomas pueden Sales executive. No espere a ver si los sntomas desaparecen. Solicite atencin mdica de inmediato. Comunquese con el servicio de emergencias de su localidad (911 en los [place]). No conduzca por sus propios medios [employer] hospital. Resumen La deshidratacin es una afeccin que se caracteriza por una cantidad insuficiente de agua u otros lquidos en el organismo. Esto sucede cuando una persona pierde ms lquidos de los que consume. El tratamiento de esta afeccin depende de su gravedad. El tratamiento se debe comenzar de inmediato. No espere hasta que su afeccin sea muy grave. Beba suficiente lquido transparente como para mantener la orina de color amarillo plido. Si le indicaron que tome una solucin de rehidratacin oral (SRO), termine primero la solucin. Luego, comience a beber lentamente otros lquidos transparentes. Use los medicamentos de venta libre y los recetados solamente como se lo haya indicado el mdico. Obtenga ayuda de inmediato si tiene algn sntoma de deshidratacin muy grave. Esta informacin no tiene Marine scientist el consejo del mdico. Asegrese de hacerle al mdico cualquier pregunta que tenga. Document Revised: 07/19/2019 Document Reviewed: 07/19/2019 Elsevier Patient Education  [place].

## 2022-11-02 ENCOUNTER — Ambulatory Visit: Payer: Commercial Managed Care - HMO | Admitting: Hematology

## 2022-11-03 ENCOUNTER — Other Ambulatory Visit: Payer: Self-pay

## 2022-11-05 ENCOUNTER — Other Ambulatory Visit: Payer: Self-pay

## 2022-11-09 ENCOUNTER — Other Ambulatory Visit: Payer: Self-pay | Admitting: Hematology

## 2022-11-09 DIAGNOSIS — C23 Malignant neoplasm of gallbladder: Secondary | ICD-10-CM

## 2022-11-10 ENCOUNTER — Other Ambulatory Visit: Payer: Self-pay | Admitting: Hematology

## 2022-11-10 DIAGNOSIS — C23 Malignant neoplasm of gallbladder: Secondary | ICD-10-CM

## 2022-11-10 MED FILL — Fosaprepitant Dimeglumine For IV Infusion 150 MG (Base Eq): INTRAVENOUS | Qty: 5 | Status: AC

## 2022-11-10 MED FILL — Dexamethasone Sodium Phosphate Inj 100 MG/10ML: INTRAMUSCULAR | Qty: 1 | Status: AC

## 2022-11-11 ENCOUNTER — Other Ambulatory Visit: Payer: Self-pay

## 2022-11-11 ENCOUNTER — Encounter: Payer: Self-pay | Admitting: Nurse Practitioner

## 2022-11-11 ENCOUNTER — Inpatient Hospital Stay: Payer: Commercial Managed Care - HMO

## 2022-11-11 ENCOUNTER — Inpatient Hospital Stay (HOSPITAL_BASED_OUTPATIENT_CLINIC_OR_DEPARTMENT_OTHER): Payer: Commercial Managed Care - HMO | Admitting: Nurse Practitioner

## 2022-11-11 VITALS — BP 134/78 | HR 75 | Temp 99.0°F | Resp 16

## 2022-11-11 DIAGNOSIS — C23 Malignant neoplasm of gallbladder: Secondary | ICD-10-CM

## 2022-11-11 DIAGNOSIS — Z5111 Encounter for antineoplastic chemotherapy: Secondary | ICD-10-CM | POA: Diagnosis not present

## 2022-11-11 LAB — CBC WITH DIFFERENTIAL (CANCER CENTER ONLY)
Abs Immature Granulocytes: 0.12 10*3/uL — ABNORMAL HIGH (ref 0.00–0.07)
Basophils Absolute: 0 10*3/uL (ref 0.0–0.1)
Basophils Relative: 0 %
Eosinophils Absolute: 0 10*3/uL (ref 0.0–0.5)
Eosinophils Relative: 0 %
HCT: 36.1 % (ref 36.0–46.0)
Hemoglobin: 12.3 g/dL (ref 12.0–15.0)
Immature Granulocytes: 1 %
Lymphocytes Relative: 7 %
Lymphs Abs: 0.9 10*3/uL (ref 0.7–4.0)
MCH: 31.2 pg (ref 26.0–34.0)
MCHC: 34.1 g/dL (ref 30.0–36.0)
MCV: 91.6 fL (ref 80.0–100.0)
Monocytes Absolute: 0.9 10*3/uL (ref 0.1–1.0)
Monocytes Relative: 6 %
Neutro Abs: 11.7 10*3/uL — ABNORMAL HIGH (ref 1.7–7.7)
Neutrophils Relative %: 86 %
Platelet Count: 473 10*3/uL — ABNORMAL HIGH (ref 150–400)
RBC: 3.94 MIL/uL (ref 3.87–5.11)
RDW: 13.5 % (ref 11.5–15.5)
WBC Count: 13.6 10*3/uL — ABNORMAL HIGH (ref 4.0–10.5)
nRBC: 0 % (ref 0.0–0.2)

## 2022-11-11 LAB — CMP (CANCER CENTER ONLY)
ALT: 24 U/L (ref 0–44)
AST: 17 U/L (ref 15–41)
Albumin: 3.7 g/dL (ref 3.5–5.0)
Alkaline Phosphatase: 97 U/L (ref 38–126)
Anion gap: 10 (ref 5–15)
BUN: 20 mg/dL (ref 6–20)
CO2: 26 mmol/L (ref 22–32)
Calcium: 9.2 mg/dL (ref 8.9–10.3)
Chloride: 100 mmol/L (ref 98–111)
Creatinine: 0.59 mg/dL (ref 0.44–1.00)
GFR, Estimated: >60 mL/min (ref >60)
Glucose, Bld: 166 mg/dL — ABNORMAL HIGH (ref 70–99)
Potassium: 3.9 mmol/L (ref 3.5–5.1)
Sodium: 136 mmol/L (ref 135–145)
Total Bilirubin: 0.5 mg/dL (ref 0.3–1.2)
Total Protein: 7 g/dL (ref 6.5–8.1)

## 2022-11-11 LAB — MAGNESIUM: Magnesium: 2.1 mg/dL (ref 1.7–2.4)

## 2022-11-11 MED ORDER — MAGNESIUM SULFATE 2 GM/50ML IV SOLN
2.0000 g | Freq: Once | INTRAVENOUS | Status: AC
  Administered 2022-11-11: 2 g via INTRAVENOUS
  Filled 2022-11-11: qty 50

## 2022-11-11 MED ORDER — SODIUM CHLORIDE 0.9% FLUSH
10.0000 mL | INTRAVENOUS | Status: DC | PRN
  Administered 2022-11-11: 10 mL

## 2022-11-11 MED ORDER — PALONOSETRON HCL INJECTION 0.25 MG/5ML
0.2500 mg | Freq: Once | INTRAVENOUS | Status: AC
  Administered 2022-11-11: 0.25 mg via INTRAVENOUS
  Filled 2022-11-11: qty 5

## 2022-11-11 MED ORDER — SODIUM CHLORIDE 0.9% FLUSH
10.0000 mL | INTRAVENOUS | Status: DC | PRN
  Administered 2022-11-11: 10 mL via INTRAVENOUS

## 2022-11-11 MED ORDER — SODIUM CHLORIDE 0.9 % IV SOLN
25.0000 mg/m2 | Freq: Once | INTRAVENOUS | Status: AC
  Administered 2022-11-11: 46 mg via INTRAVENOUS
  Filled 2022-11-11: qty 46

## 2022-11-11 MED ORDER — SODIUM CHLORIDE 0.9 % IV SOLN: Freq: Once | INTRAVENOUS | Status: AC

## 2022-11-11 MED ORDER — HEPARIN SOD (PORK) LOCK FLUSH 100 UNIT/ML IV SOLN
500.0000 [IU] | Freq: Once | INTRAVENOUS | Status: AC | PRN
  Administered 2022-11-11: 500 [IU]

## 2022-11-11 MED ORDER — SODIUM CHLORIDE 0.9 % IV SOLN
150.0000 mg | Freq: Once | INTRAVENOUS | Status: AC
  Administered 2022-11-11: 150 mg via INTRAVENOUS
  Filled 2022-11-11: qty 150

## 2022-11-11 MED ORDER — POTASSIUM CHLORIDE IN NACL 20-0.9 MEQ/L-% IV SOLN
Freq: Once | INTRAVENOUS | Status: AC
  Filled 2022-11-11: qty 1000

## 2022-11-11 MED ORDER — SODIUM CHLORIDE 0.9 % IV SOLN
10.0000 mg | Freq: Once | INTRAVENOUS | Status: AC
  Administered 2022-11-11: 10 mg via INTRAVENOUS
  Filled 2022-11-11: qty 10

## 2022-11-11 MED ORDER — SODIUM CHLORIDE 0.9 % IV SOLN
1000.0000 mg/m2 | Freq: Once | INTRAVENOUS | Status: AC
  Administered 2022-11-11: 1825 mg via INTRAVENOUS
  Filled 2022-11-11: qty 48

## 2022-11-11 MED ORDER — SODIUM CHLORIDE 0.9 % IV SOLN
1500.0000 mg | Freq: Once | INTRAVENOUS | Status: AC
  Administered 2022-11-11: 1500 mg via INTRAVENOUS
  Filled 2022-11-11: qty 30

## 2022-11-11 NOTE — Patient Instructions (Signed)
Instrucciones al darle de alta: Discharge Instructions Gracias por elegir al [facility] de Cncer de [place] para brindarle atencin mdica de oncologa y Music therapist.   Si usted tiene una cita de laboratorio con [place], por favor vaya directamente [place] y regstrese en el rea de Control and instrumentation engineer.   Use ropa cmoda y [place] para tener fcil acceso a las vas del [place]ath (acceso venoso de Engineer, site duracin) o la lnea PICC (catter central colocado por va perifrica).   Nos esforzamos por ofrecerle tiempo de calidad con su proveedor. Es posible que tenga que volver a programar su cita si llega tarde (15 minutos o ms).  El llegar tarde le afecta a usted y a otros pacientes cuyas citas son posteriores a Merchandiser, retail.  Adems, si usted falta a tres o ms citas sin avisar a la oficina, puede ser retirado(a) de la clnica a discrecin del proveedor.      Para las solicitudes de renovacin de recetas, pida a su farmacia que se ponga en contacto con nuestra oficina y deje que transcurran 3 horas para que se complete el proceso de las renovaciones.    Hoy usted recibi los siguientes agentes de quimioterapia e/o inmunoterapia: durvalumab, cisplatin, gemcitabine      Para ayudar a prevenir las nuseas y los vmitos despus de su tratamiento, le recomendamos que tome su medicamento para las nuseas segn las indicaciones.  LOS SNTOMAS QUE DEBEN COMUNICARSE INMEDIATAMENTE SE INDICAN A CONTINUACIN: *FIEBRE SUPERIOR A 100.4 F (38 C) O MS *ESCALOFROS O SUDORACIN *NUSEAS Y VMITOS QUE NO SE CONTROLAN CON EL MEDICAMENTO PARA LAS NUSEAS *DIFICULTAD INUSUAL PARA RESPIRAR  *MORETONES O HEMORRAGIAS NO HABITUALES *PROBLEMAS URINARIOS (dolor o ardor al Garment/textile technologist o frecuencia para Garment/textile technologist) *PROBLEMAS INTESTINALES (diarrea inusual, estreimiento, dolor cerca del ano) SENSIBILIDAD EN LA BOCA Y EN LA GARGANTA CON O SIN LA PRESENCIA DE LCERAS (dolor de garganta, llagas en la boca o dolor de  muelas/dientes) ERUPCIN, HINCHAZN O DOLORES INUSUALES FLUJO VAGINAL INUSUAL O PICAZN/RASQUIA    Los puntos marcados con un asterisco ( *) indican una posible emergencia y debe hacer un seguimiento tan pronto como le sea posible o vaya al Departamento de Emergencias si se le presenta algn problema.  Por favor, muestre la [place] DE ADVERTENCIA DE Windy Canny DE ADVERTENCIA DE Benay Spice al registrarse en [address] de Emergencias y a la enfermera de triaje.  Si tiene preguntas despus de su visita o necesita cancelar o volver a programar su cita, por favor pngase en contacto con [place]  Dept: [phone] y [place]. Las horas de oficina son de 8:00 a.m. a 4:30 p.m. de lunes a viernes. Por favor, tenga en cuenta que los mensajes de voz que se dejan despus de las 4:00 p.m. posiblemente no se devolvern hasta el siguiente da de [place].  Cerramos los fines de semana y [employer]. En todo momento tiene acceso a una enfermera para preguntas urgentes. Por favor, llame al nmero principal de la clnica Dept: [phone] y [place] instrucciones.   Para cualquier pregunta que no sea de carcter urgente, tambin puede ponerse en contacto con su proveedor [employer]. Ahora ofrecemos visitas electrnicas para cualquier persona mayor de 18 aos que solicite atencin mdica en lnea para los sntomas que no sean urgentes. Para ms detalles vaya a [email or web address].   Tambin puede bajar la aplicacin de MyChart! Vaya a la tienda de aplicaciones, busque "MyChart", abra la  aplicacin, seleccione [place], e ingrese con su nombre de usuario y la contrasea de Pharmacist, community.

## 2022-11-11 NOTE — Progress Notes (Signed)
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 Cholelithiasis with distended gallbladder. No CT evidence of acute cholecystitis. Consider further evaluation with right upper quadrant ultrasound as clinically indicated. 2. Punctate nonobstructive left nephrolithiasis. 3. Aortic Atherosclerosis (ICD10-I70.0).   07/21/2021 Imaging   ABD [place] RUQ IMPRESSION: 1. Cholelithiasis without evidence of acute cholecystitis. 2. Mild hepatic steatosis.     07/21/2021 Imaging   MRA/MRCP IMPRESSION: 1. No biliary ductal dilation. No choledocholithiasis. 2. Mild hepatic steatosis. 3. Cholelithiasis with dilation of the gallbladder and trace pericholecystic fluid. No gallbladder wall thickening or abnormal wall enhancement. Findings which are equivocal for acute cholecystitis. Consider further evaluation with nuclear medicine HIDA scan to assess cystic duct patency if clinically indicated.   07/22/2021 Imaging   HIDA SCAN IMPRESSION: Scintigraphic findings most consistent with acute cholecystitis.   07/23/2021 Surgery   Preop Dx:        Acute cholecystitis Postop Dx:      Acute cholecystitis with 1 cm stone impacted in the infundibulum Procedure:      Xi robotic cholecystectomy with ICG By Dr. Johnathan Hausen   07/23/2021 Pathology Results   FINAL MICROSCOPIC DIAGNOSIS:  A. GALLBLADDER, CHOLECYSTECTOMY:  - Invasive poorly differentiated adenocarcinoma, 4.3 cm, involving  gallbladder neck  - Carcinoma  invades perimuscular soft tissue  - Cystic duct margin is focally positive for carcinoma  - Focally suspicious for lymphovascular invasion   Procedure: Cholecystectomy  Tumor Site: Gallbladder neck  Tumor Size: 4.3 cm  Histologic Type: Adenocarcinoma  Histologic Grade: G3: Poorly differentiated  Tumor Extension: Tumor invades perimuscular connective tissue on the peritoneal side without serosal involvement  Margins:       Margin Status for Invasive Carcinoma: Cystic duct margin is positive for carcinoma  Regional Lymph Nodes: Not applicable (no lymph nodes submitted or found)  Distant Metastasis:       Distant Site(s) Involved: Not applicable  Pathologic Stage Classification (pTNM, AJCC 8th Edition): pT2a, pN not assigned    08/20/2021 Imaging   CT chest IMPRESSION: Negative. No CT evidence for acute intrathoracic abnormality. Negative for pulmonary nodule or evidence for metastatic disease to the chest.   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 Michaelle Birks Staging laparoscopy Exploratory laparotomy with intraoperative cholangiogram Excision of new cystic duct margin Partial central hepatectomy (resection of gallbladder fossa - segments 4b and 5) Portal lymph node dissection   09/04/2021 Pathology Results   FINAL MICROSCOPIC DIAGNOSIS:  A. CYSTIC DUCT, NEW MARGIN, EXCISION:  -  No adenocarcinoma identified  B. LYMPH NODE, PORTAL, EXCISION:  -  Nodular fat necrosis and fibrosis  -  No nodal tissue identified  C. GALLBLADDER, FOSSA, EXCISION:  -  Benign liver with periductular chronic inflammation and  macrovesicular steatosis  -  No adenocarcinoma identified  D. LYMPH NODE, STATION EIGHT, EXCISION:  -  No adenocarcinoma identified in one lymph node (0/1)    09/04/2021 Cancer Staging   Staging  form: Gallbladder, AJCC 8th Edition - Clinical  stage from 09/04/2021: Stage IIA (cT2a, cN0, cM0) - Signed by Alla Feeling, NP on 09/21/2021 Stage prefix: Initial diagnosis Total positive nodes: 0 Histologic grade (G): G3 Histologic grading system: 3 grade system Histologic sub-type: Adenocarcinoma   09/28/2021 - 09/28/2021 Chemotherapy   Patient is on Treatment Plan : BREAST Capecitabine q21d     09/30/2022 Relapse/Recurrence   FINAL MICROSCOPIC DIAGNOSIS:  A. 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  It is composed of solid sheets and nests of atypical cells with variably enlarged round to oval irregular nuclei and a variable amount of eosinophilic focally vacuolated cytoplasm, and these sheets and nests are admixed with infiltrating irregular angulated glands lined by an atypical columnar epithelium composed of similar-appearing cells.  Glandular lumens contain mucin.  Focally these epithelia are contiguous and are present within the same sheet and/or gland. Five immunohistochemical stains performed with adequate control. The tumor is positive for cytokeratin 7 and the solid sheets and nests are positive for the squamous marker p40.  The tumor is negative for cytokeratin 20.  The tumor is also negative for the GI marker CDX2.  The tumor is negative for the hepatic marker arginase.  The prior cholecystectomy specimen ([id number]) is reviewed and this carcinoma shows identical cytohistomorphology as the previously diagnosed gallbladder adenocarcinoma.    10/01/2022 PET scan   IMPRESSION: 1. Progressive soft tissue mass involving the right lower quadrant ventral abdominal wall is intensely FDG avid compatible with recurrent tumor. 2. No signs of FDG avid nodal or solid organ metastasis. 3. Multiple fat containing ventral abdominal  wall hernias. 4.  Aortic Atherosclerosis (ICD10-I70.0).   10/12/2022 - 10/12/2022 Chemotherapy   Patient is on Treatment Plan : BILIARY TRACT Cisplatin + Gemcitabine D1,8 q21d     10/12/2022 - 10/12/2022 Chemotherapy   Patient is on Treatment Plan : BLADDER Durvalumab (10) q14d     10/19/2022 -  Chemotherapy   Patient is on Treatment Plan : BILIARY TRACT Cisplatin + Gemcitabine D1,8 q21d     10/19/2022 -  Chemotherapy   Patient is on Treatment Plan : BLADDER Durvalumab (10) q14d       CURRENT THERAPY: Gemcitabine and cisplatin days 1, 8 q21 days, plus durvalumab q21 days   INTERVAL HISTORY: Michele Arnold was seen in the infusion room with family and Spanish speaking interpreter for cycle 2.  She tolerated cycle 1 day 8 much better, less downtime only 3 days.  She had no taste but was able to eat and drink more.  Nausea improved, no vomiting.  She is taking Dex twice a day as prescribed on the bottle.  She manages constipation with MiraLAX "chocolates."  Abdominal pain also improved, taking MS Contin twice daily.  Now pain occurs only when constipated.  She had a cramp in her left calf that resolved when she stretched and moved.  No swelling, redness, or warmth   REVIEW OF SYSTEMS:   All other systems were reviewed with the patient and are negative.  MEDICAL HISTORY:  Past Medical History:  Diagnosis Date   Cancer [facility])    Hypertension     SURGICAL HISTORY: Past Surgical History:  Procedure Laterality Date   CHOLECYSTECTOMY  07/23/2021   INTRAOPERATIVE CHOLANGIOGRAM N/A 09/04/2021   Procedure: INTRAOPERATIVE CHOLANGIOGRAM;  Surgeon: Dwan Bolt, MD;  Location: [place];  Service: General;  Laterality: N/A;   LAPAROSCOPY N/A 09/04/2021   Procedure: STAGING LAPAROSCOPY;  Surgeon: Dwan Bolt, MD;  Location: [place];  Service: General;  Laterality: N/A;   LYMPH NODE DISSECTION N/A 09/04/2021   Procedure: PORTAL LYMPH NODE DISSECTION;  Surgeon: Dwan Bolt, MD;  Location: [place];   Service: General;  Laterality: N/A;   OPEN HEPATECTOMY  N/A 09/04/2021   Procedure: OPEN PARTIAL CENTRAL HEPATECTOMY;  Surgeon: Dwan Bolt, MD;  Location: [place];  Service: General;  Laterality: N/A;   PORTACATH PLACEMENT Right 10/15/2022   Procedure: INSERTION PORT-A-CATH;  Surgeon: Dwan Bolt, MD;  Location: [place];  Service: General;  Laterality: Right;    I have reviewed the social history and family history with the patient and they are unchanged from previous note.  ALLERGIES:  has No Known Allergies.  MEDICATIONS:  Current Outpatient Medications  Medication Sig Dispense Refill   acetaminophen (TYLENOL) 325 MG tablet Take 2 tablets (650 mg total) by mouth every 6 (six) hours as needed for mild pain or fever. (Patient not taking: Reported on 10/14/2022)     atorvastatin (LIPITOR) 40 MG tablet Take 40 mg by mouth daily.     dexamethasone (DECADRON) 4 MG tablet Take 1 tablet (4 mg total) by mouth 2 (two) times daily with a meal. 30 tablet 0   hydrochlorothiazide (MICROZIDE) 12.5 MG capsule Take 12.5 mg by mouth daily.     HYDROcodone-acetaminophen (NORCO/VICODIN) 5-325 MG tablet Take 1 tablet by mouth every 6 (six) hours as needed for severe pain. (Patient taking differently: Take 1 tablet by mouth every 6 (six) hours.) 90 tablet 0   lidocaine-prilocaine (EMLA) cream Apply to affected area once (Patient taking differently: Apply 1 Application topically as needed. Apply to affected area once) 30 g 3   lisinopril (ZESTRIL) 10 MG tablet Take 1 tablet (10 mg total) by mouth daily. 30 tablet 0   morphine (MS CONTIN) 15 MG 12 hr tablet Take 1 tablet (15 mg total) by mouth every 12 (twelve) hours. 30 tablet 0   ondansetron (ZOFRAN) 8 MG tablet Take 1 tablet (8 mg total) by mouth every 8 (eight) hours as needed for nausea or vomiting. Start on the third day after cisplatin. 30 tablet 1   prochlorperazine (COMPAZINE) 10 MG tablet Take 1 tablet (10 mg total) by mouth every 6 (six) hours as  needed (Nausea or vomiting). 30 tablet 1   urea (CARMOL) 10 % cream Apply topically to hands and feet 2 to 3 times daily as needed 71 g 2   No current facility-administered medications for this visit.   Facility-Administered Medications Ordered in Other Visits  Medication Dose Route Frequency Provider Last Rate Last Admin   heparin lock flush 100 unit/mL  500 Units Intracatheter Once PRN Truitt Merle, MD       sodium chloride flush (NS) 0.9 % injection 10 mL  10 mL Intracatheter PRN Truitt Merle, MD        PHYSICAL EXAMINATION: ECOG PERFORMANCE STATUS: 1 - Symptomatic but completely ambulatory  See infusion flowsheet for vitals There were no vitals filed for this visit. There were no vitals filed for this visit.  GENERAL:alert, no distress and comfortable SKIN: No rash EYES: sclera clear LUNGS: clear with normal breathing effort HEART: regular rate & rhythm, no lower extremity edema ABDOMEN:abdomen soft, non-tender and normal bowel sounds.  Fullness in the right lower quadrant, softer NEURO: alert & oriented x 3 with fluent speech, no focal  motor/sensory deficits PAC without erythema   LABORATORY DATA:  I have reviewed the data as listed    Latest Ref Rng & Units 11/11/2022    7:55 AM 11/01/2022   11:31 AM 10/28/2022    7:46 AM  CBC  WBC 4.0 - 10.5 K/uL 13.6  8.2  10.6   Hemoglobin 12.0 - 15.0 g/dL 12.3  11.1  10.9   Hematocrit 36.0 - 46.0 % 36.1  33.2  32.4   Platelets 150 - 400 K/uL 473  365  271         Latest Ref Rng & Units 11/11/2022    7:55 AM 11/01/2022   11:31 AM 10/28/2022    7:46 AM  CMP  Glucose 70 - 99 mg/dL 166  143  133   BUN 6 - 20 mg/dL '20  14  9   '$ Creatinine 0.44 - 1.00 mg/dL 0.59  0.64  0.58   Sodium 135 - 145 mmol/L 136  134  133   Potassium 3.5 - 5.1 mmol/L 3.9  3.8  3.5   Chloride 98 - 111 mmol/L 100  101  100   CO2 22 - 32 mmol/L '26  25  24   '$ Calcium 8.9 - 10.3 mg/dL 9.2  9.3  9.1   Total Protein 6.5 - 8.1 g/dL 7.0  7.1  7.0   Total Bilirubin  0.3 - 1.2 mg/dL 0.5  0.9  0.7   Alkaline Phos 38 - 126 U/L 97  90  93   AST 15 - 41 U/L '17  18  17   '$ ALT 0 - 44 U/L '24  22  17       '$ RADIOGRAPHIC STUDIES: I have personally reviewed the radiological images as listed and agreed with the findings in the report. No results found.   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 Zenia Resides 09/04/2021  -S/p 6 months adjuvant chemotherapy with Xeloda -She developed abdominal pain, bx abd wall mass 09/20/22 confirmed metastatic recurrence of gallbladder cancer  -PET scan 10/01/22 showed no other areas of hypermetabolism.  -She had port placed 12/1 began first line systemic chemotherapy with cisplatin and gemcitabine on days 1, 8 w21 days and durvalumab q21 days on 10/19/22. This is currently being given in the neoadjuvant setting. -C1D1 she had significant CINV and weight loss, low appetite, fatigue, and uncontrolled abdominal pain. She was given Dex (which she took BID rather than x3-5 days after chemo), started MS contin, and received IVF -Ms. Pfahler appears improved. She tolerated cycle 1 well. Abdominal pain and nausea improved. I educated her to reduce dex to 1 tab daily x3 days after chemo. She was able to recover and function well -No clinical evidence of disease progression -Labs adequate to proceed with cycle 2 day 1 Durval/cis/gem today as planned.  -F/up and day 8 next week   Abdominal pain, transaminitis, hyperbilirubinemia -secondary to #1 -Transaminitis and elevated bilirubin are intermittent -she developed RLQ pain with recurrence, mild - moderate. Takes norco q5-6 hours which is helpful -Pain remained not well controlled right after C1D8 before starting MS contin. On bowel regimen -Improved after cycle 1   3. Hypertension -continue meds and  PCP f/up  PLAN: -Labs reviewed, leukocytosis likely from dex, she will reduce dose to 1 tab daily x3-5 days after chemo -Labs adequate for cycle 2 day  1 cis/gem and durvalumab today as planned -F/up and day 8 next week -F/up and cycle 3 in 3 weeks   All questions were answered. The patient knows to call the clinic with any problems, questions or concerns. No barriers to learning were detected.      Alla Feeling, NP 11/11/22

## 2022-11-13 ENCOUNTER — Other Ambulatory Visit: Payer: Self-pay

## 2022-11-16 ENCOUNTER — Other Ambulatory Visit: Payer: Self-pay | Admitting: Hematology

## 2022-11-16 ENCOUNTER — Telehealth: Payer: Self-pay

## 2022-11-16 ENCOUNTER — Other Ambulatory Visit: Payer: Self-pay

## 2022-11-16 MED ORDER — MORPHINE SULFATE ER 15 MG PO TBCR: 15.0000 mg | EXTENDED_RELEASE_TABLET | Freq: Two times a day (BID) | ORAL | 0 refills | Status: DC

## 2022-11-16 NOTE — Telephone Encounter (Signed)
Pt's daughter called stating the pt would like a refill on her Morphine (MS Contin) '15mg'$ .  Pt's daughter confirmed preferred pharmacy - [facility] on [address] [place], [place].  Notified Dr. Burr Medico of pt's request for refill.

## 2022-11-17 ENCOUNTER — Telehealth: Payer: Self-pay | Admitting: Hematology

## 2022-11-17 MED FILL — Fosaprepitant Dimeglumine For IV Infusion 150 MG (Base Eq): INTRAVENOUS | Qty: 5 | Status: AC

## 2022-11-17 MED FILL — Dexamethasone Sodium Phosphate Inj 100 MG/10ML: INTRAMUSCULAR | Qty: 1 | Status: AC

## 2022-11-17 NOTE — Telephone Encounter (Signed)
Cancelled appointments per patients request due to having the flu. Scheduling team will work on getting the patient rescheduled for next week.

## 2022-11-18 ENCOUNTER — Inpatient Hospital Stay: Payer: No Typology Code available for payment source | Admitting: Hematology

## 2022-11-18 ENCOUNTER — Ambulatory Visit: Payer: Commercial Managed Care - HMO

## 2022-11-18 ENCOUNTER — Other Ambulatory Visit: Payer: Commercial Managed Care - HMO

## 2022-11-18 ENCOUNTER — Inpatient Hospital Stay: Payer: No Typology Code available for payment source

## 2022-11-19 ENCOUNTER — Telehealth: Payer: Self-pay | Admitting: Hematology

## 2022-11-19 NOTE — Telephone Encounter (Signed)
Rescheduled and cancelled appointments (1/4 appointments and 1/8 infusion) per patient due to not feeling well. Patient was rescheduled for port flush/labs. MD viist with Dr.Feng and infusion for Monday 1/8; however, patient did not want to receive her infusion at this time and wanted to follow up with Dr.Feng first. Scheduler encouraged the patient to stop by the scheduling department on Monday 1/8 after visit with Dr.Feng so future appointments can be scheduled at that time.

## 2022-11-19 NOTE — Progress Notes (Deleted)
[place]   [phone] [phone]   Clinic Follow up Note   Patient Care Team: Geradine Girt, DO as PCP - General (Internal Medicine) Dwan Bolt, MD as Consulting Physician (General Surgery) Truitt Merle, MD as Consulting Physician (Hematology) [place] as Consulting Physician (Radiology)  Date of Service:  11/19/2022  CHIEF COMPLAINT: f/u of {diagnosis, copy from prior note}  CURRENT THERAPY:  {Usually copy/paste from prior note, but pay attention to if treatment changes. Or change to Surveillance if they finished treatment}  ASSESSMENT: *** Michele Arnold is a 62 y.o. female with   No problem-specific Assessment & Plan notes found for this encounter.  ***   PLAN: {Everything Dr. Burr Medico talks to pt about, including reviewing scans and labs. 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 Cholelithiasis with distended gallbladder. No CT evidence of acute cholecystitis. Consider further evaluation with right upper quadrant ultrasound as clinically indicated. 2. Punctate nonobstructive left nephrolithiasis. 3. Aortic Atherosclerosis (ICD10-I70.0).   07/21/2021 Imaging   ABD [place] RUQ IMPRESSION: 1. Cholelithiasis without evidence of acute cholecystitis. 2. Mild hepatic steatosis.     07/21/2021 Imaging   MRA/MRCP IMPRESSION: 1. No biliary ductal dilation. No choledocholithiasis. 2. Mild hepatic steatosis. 3. Cholelithiasis with dilation of the gallbladder and trace pericholecystic fluid. No gallbladder wall thickening or abnormal wall enhancement. Findings which are equivocal for acute cholecystitis. Consider further evaluation with nuclear medicine HIDA scan to assess cystic duct patency if clinically indicated.   07/22/2021 Imaging   HIDA SCAN  IMPRESSION: Scintigraphic findings most consistent with acute cholecystitis.   07/23/2021 Surgery   Preop Dx:        Acute cholecystitis Postop Dx:      Acute cholecystitis with 1 cm stone impacted in the infundibulum Procedure:      Xi robotic cholecystectomy with ICG By Dr. Johnathan Hausen   07/23/2021 Pathology Results   FINAL MICROSCOPIC DIAGNOSIS:  A. GALLBLADDER, CHOLECYSTECTOMY:  - Invasive poorly differentiated adenocarcinoma, 4.3 cm, involving  gallbladder neck  - Carcinoma invades perimuscular soft tissue  - Cystic duct margin is focally positive for carcinoma  - Focally suspicious for lymphovascular invasion   Procedure: Cholecystectomy  Tumor Site: Gallbladder neck  Tumor Size: 4.3 cm  Histologic Type: Adenocarcinoma  Histologic Grade: G3: Poorly differentiated  Tumor Extension: Tumor invades perimuscular connective tissue on the peritoneal side without serosal involvement  Margins:       Margin Status for Invasive Carcinoma: Cystic duct margin is positive for carcinoma  Regional Lymph Nodes: Not applicable (no lymph nodes submitted or found)  Distant Metastasis:       Distant Site(s) Involved: Not applicable  Pathologic Stage Classification (pTNM, AJCC 8th Edition): pT2a, pN not assigned    08/20/2021 Imaging   CT chest IMPRESSION: Negative. No CT evidence for acute intrathoracic abnormality. Negative for pulmonary nodule or evidence for metastatic disease to the chest.   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 Michaelle Birks Staging laparoscopy Exploratory laparotomy with intraoperative cholangiogram Excision of new cystic duct margin Partial central hepatectomy (resection of gallbladder fossa - segments 4b and 5) Portal lymph node dissection   09/04/2021 Pathology Results   FINAL MICROSCOPIC DIAGNOSIS:  A.  CYSTIC DUCT, NEW MARGIN,  EXCISION:  -  No adenocarcinoma identified  B. LYMPH NODE, PORTAL, EXCISION:  -  Nodular fat necrosis and fibrosis  -  No nodal tissue identified  C. GALLBLADDER, FOSSA, EXCISION:  -  Benign liver with periductular chronic inflammation and  macrovesicular steatosis  -  No adenocarcinoma identified  D. LYMPH NODE, STATION EIGHT, EXCISION:  -  No adenocarcinoma identified in one lymph node (0/1)    09/04/2021 Cancer Staging   Staging form: Gallbladder, AJCC 8th Edition - Clinical stage from 09/04/2021: Stage IIA (cT2a, cN0, cM0) - Signed by Alla Feeling, NP on 09/21/2021 Stage prefix: Initial diagnosis Total positive nodes: 0 Histologic grade (G): G3 Histologic grading system: 3 grade system Histologic sub-type: Adenocarcinoma   09/28/2021 - 09/28/2021 Chemotherapy   Patient is on Treatment Plan : BREAST Capecitabine q21d     09/30/2022 Relapse/Recurrence   FINAL MICROSCOPIC DIAGNOSIS:  A. 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  It is composed of solid sheets and nests of atypical cells with variably enlarged round to oval irregular nuclei and a variable amount of eosinophilic focally vacuolated cytoplasm, and these sheets and nests are admixed with infiltrating irregular angulated glands lined by an atypical columnar epithelium composed of similar-appearing cells.  Glandular lumens contain mucin.  Focally these epithelia are contiguous and are present within the same sheet and/or gland. Five immunohistochemical stains performed with adequate control. The tumor is positive for cytokeratin 7 and the solid sheets and nests are positive for the squamous marker p40.  The tumor is negative for cytokeratin 20.  The tumor is also negative for the GI marker CDX2.  The tumor is negative  for the hepatic marker arginase.  The prior cholecystectomy specimen ([id number]) is reviewed and this carcinoma shows identical cytohistomorphology as the previously diagnosed gallbladder adenocarcinoma.    10/01/2022 PET scan   IMPRESSION: 1. Progressive soft tissue mass involving the right lower quadrant ventral abdominal wall is intensely FDG avid compatible with recurrent tumor. 2. No signs of FDG avid nodal or solid organ metastasis. 3. Multiple fat containing ventral abdominal wall hernias. 4.  Aortic Atherosclerosis (ICD10-I70.0).   10/12/2022 - 10/12/2022 Chemotherapy   Patient is on Treatment Plan : BILIARY TRACT Cisplatin + Gemcitabine D1,8 q21d     10/12/2022 - 10/12/2022 Chemotherapy   Patient is on Treatment Plan : BLADDER Durvalumab (10) q14d     10/19/2022 -  Chemotherapy   Patient is on Treatment Plan : BILIARY TRACT Cisplatin + Gemcitabine D1,8 q21d     10/19/2022 -  Chemotherapy   Patient is on Treatment Plan : BLADDER Durvalumab (10) q14d        INTERVAL HISTORY: *** Lillianah Swartzentruber is here for a follow up of {diagnosis} She was last seen by {provider} on {date} She presents to the clinic {alone/accompanied by}. {Everything the pt says, basically. How they're feeling, complaints and concerns, etc}   All other systems were reviewed with the patient and are negative.  MEDICAL HISTORY:  Past Medical History:  Diagnosis Date   Cancer [facility])    Hypertension     SURGICAL HISTORY: Past Surgical History:  Procedure Laterality Date   CHOLECYSTECTOMY  07/23/2021   INTRAOPERATIVE CHOLANGIOGRAM N/A 09/04/2021   Procedure: INTRAOPERATIVE CHOLANGIOGRAM;  Surgeon: Dwan Bolt, MD;  Location: [place];  Service: General;  Laterality: N/A;  LAPAROSCOPY N/A 09/04/2021   Procedure: STAGING LAPAROSCOPY;  Surgeon: Dwan Bolt, MD;  Location: [place];  Service: General;  Laterality: N/A;   LYMPH NODE DISSECTION N/A 09/04/2021   Procedure: PORTAL LYMPH NODE DISSECTION;   Surgeon: Dwan Bolt, MD;  Location: [place];  Service: General;  Laterality: N/A;   OPEN HEPATECTOMY  N/A 09/04/2021   Procedure: OPEN PARTIAL CENTRAL HEPATECTOMY;  Surgeon: Dwan Bolt, MD;  Location: [place];  Service: General;  Laterality: N/A;   PORTACATH PLACEMENT Right 10/15/2022   Procedure: INSERTION PORT-A-CATH;  Surgeon: Dwan Bolt, MD;  Location: [place];  Service: General;  Laterality: Right;    I have reviewed the social history and family history with the patient and they are unchanged from previous note.  ALLERGIES:  has No Known Allergies.  MEDICATIONS:  Current Outpatient Medications  Medication Sig Dispense Refill   acetaminophen (TYLENOL) 325 MG tablet Take 2 tablets (650 mg total) by mouth every 6 (six) hours as needed for mild pain or fever. (Patient not taking: Reported on 10/14/2022)     atorvastatin (LIPITOR) 40 MG tablet Take 40 mg by mouth daily.     dexamethasone (DECADRON) 4 MG tablet Take 1 tablet (4 mg total) by mouth 2 (two) times daily with a meal. 30 tablet 0   hydrochlorothiazide (MICROZIDE) 12.5 MG capsule Take 12.5 mg by mouth daily.     HYDROcodone-acetaminophen (NORCO/VICODIN) 5-325 MG tablet Take 1 tablet by mouth every 6 (six) hours as needed for severe pain. (Patient taking differently: Take 1 tablet by mouth every 6 (six) hours.) 90 tablet 0   lidocaine-prilocaine (EMLA) cream Apply to affected area once (Patient taking differently: Apply 1 Application topically as needed. Apply to affected area once) 30 g 3   lisinopril (ZESTRIL) 10 MG tablet Take 1 tablet (10 mg total) by mouth daily. 30 tablet 0   morphine (MS CONTIN) 15 MG 12 hr tablet Take 1 tablet (15 mg total) by mouth every 12 (twelve) hours. 60 tablet 0   ondansetron (ZOFRAN) 8 MG tablet Take 1 tablet (8 mg total) by mouth every 8 (eight) hours as needed for nausea or vomiting. Start on the third day after cisplatin. 30 tablet 1   prochlorperazine (COMPAZINE) 10 MG tablet Take 1 tablet  (10 mg total) by mouth every 6 (six) hours as needed (Nausea or vomiting). 30 tablet 1   urea (CARMOL) 10 % cream Apply topically to hands and feet 2 to 3 times daily as needed 71 g 2   No current facility-administered medications for this visit.    PHYSICAL EXAMINATION: ECOG PERFORMANCE STATUS: {CHL ONC ECOG [phone]}  There were no vitals filed for this visit. Wt Readings from Last 3 Encounters:  11/01/22 155 lb (70.3 kg)  10/28/22 156 lb 9.6 oz (71 kg)  10/15/22 163 lb (73.9 kg)    {Only keep what was examined. If exam not performed, can use .CEXAM } GENERAL:alert, no distress and comfortable SKIN: skin color, texture, turgor are normal, no rashes or significant lesions EYES: normal, Conjunctiva are pink and non-injected, sclera clear {OROPHARYNX:no exudate, no erythema and lips, buccal mucosa, and tongue normal}  NECK: supple, thyroid normal size, non-tender, without nodularity LYMPH:  no palpable lymphadenopathy in the cervical, axillary {or inguinal} LUNGS: clear to auscultation and percussion with normal breathing effort HEART: regular rate & rhythm and no murmurs and no lower extremity edema ABDOMEN:abdomen soft, non-tender and normal bowel sounds Musculoskeletal:no cyanosis of digits and no clubbing  NEURO: alert & oriented x 3 with fluent speech, no focal motor/sensory deficits  LABORATORY DATA:  I have reviewed the data as listed    Latest Ref Rng & Units 11/11/2022    7:55 AM 11/01/2022   11:31 AM 10/28/2022    7:46 AM  CBC  WBC 4.0 - 10.5 K/uL 13.6  8.2  10.6   Hemoglobin 12.0 - 15.0 g/dL 12.3  11.1  10.9   Hematocrit 36.0 - 46.0 % 36.1  33.2  32.4   Platelets 150 - 400 K/uL 473  365  271         Latest Ref Rng & Units 11/11/2022    7:55 AM 11/01/2022   11:31 AM 10/28/2022    7:46 AM  CMP  Glucose 70 - 99 mg/dL 166  143  133   BUN 6 - 20 mg/dL '20  14  9   '$ Creatinine 0.44 - 1.00 mg/dL 0.59  0.64  0.58   Sodium 135 - 145 mmol/L 136  134  133    Potassium 3.5 - 5.1 mmol/L 3.9  3.8  3.5   Chloride 98 - 111 mmol/L 100  101  100   CO2 22 - 32 mmol/L '26  25  24   '$ Calcium 8.9 - 10.3 mg/dL 9.2  9.3  9.1   Total Protein 6.5 - 8.1 g/dL 7.0  7.1  7.0   Total Bilirubin 0.3 - 1.2 mg/dL 0.5  0.9  0.7   Alkaline Phos 38 - 126 U/L 97  90  93   AST 15 - 41 U/L '17  18  17   '$ ALT 0 - 44 U/L '24  22  17       '$ RADIOGRAPHIC STUDIES: I have personally reviewed the radiological images as listed and agreed with the findings in the report. No results found.    No orders of the defined types were placed in this encounter.  All questions were answered. The patient knows to call the clinic with any problems, questions or concerns. No barriers to learning was detected. The total time spent in the appointment was {CHL ONC TIME VISIT - CZYSA:[id number]}.     Baldemar Friday, CMA 11/19/2022   I, Audry Riles, CMA, am acting as scribe for Truitt Merle, MD.   {Add scribe attestation statement}

## 2022-11-20 ENCOUNTER — Encounter: Payer: Self-pay | Admitting: Hematology

## 2022-11-21 ENCOUNTER — Other Ambulatory Visit: Payer: Self-pay

## 2022-11-21 NOTE — Assessment & Plan Note (Signed)
-  secondary to cancer recurrence at abdominal wall  -on Norco as needed  -she is on MS contin '15mg'$  q12h

## 2022-11-21 NOTE — Assessment & Plan Note (Signed)
-  diagnosed in 07/2021, s/p surgical resection  -she received adjuvant chemotherapy with Xeloda for 6 months.  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 -she started neoadjuvant chemo cisplatin, gemcitabine and durvalumab on 10/19/22, plan to have surgical resection after chemo

## 2022-11-22 ENCOUNTER — Inpatient Hospital Stay: Payer: No Typology Code available for payment source | Attending: Hematology

## 2022-11-22 ENCOUNTER — Ambulatory Visit: Payer: Commercial Managed Care - HMO

## 2022-11-22 ENCOUNTER — Other Ambulatory Visit: Payer: Self-pay

## 2022-11-22 ENCOUNTER — Other Ambulatory Visit: Payer: Commercial Managed Care - HMO

## 2022-11-22 ENCOUNTER — Inpatient Hospital Stay (HOSPITAL_BASED_OUTPATIENT_CLINIC_OR_DEPARTMENT_OTHER): Payer: Commercial Managed Care - HMO | Admitting: Hematology

## 2022-11-22 ENCOUNTER — Encounter: Payer: Self-pay | Admitting: Hematology

## 2022-11-22 ENCOUNTER — Inpatient Hospital Stay: Payer: No Typology Code available for payment source

## 2022-11-22 ENCOUNTER — Other Ambulatory Visit: Payer: Self-pay | Admitting: Hematology

## 2022-11-22 ENCOUNTER — Ambulatory Visit: Payer: Commercial Managed Care - HMO | Admitting: Hematology

## 2022-11-22 VITALS — BP 129/58 | HR 68 | Resp 17

## 2022-11-22 VITALS — BP 145/76 | HR 78 | Temp 98.4°F | Resp 16 | Ht 64.0 in | Wt 150.0 lb

## 2022-11-22 DIAGNOSIS — Z5111 Encounter for antineoplastic chemotherapy: Secondary | ICD-10-CM | POA: Insufficient documentation

## 2022-11-22 DIAGNOSIS — C23 Malignant neoplasm of gallbladder: Secondary | ICD-10-CM

## 2022-11-22 DIAGNOSIS — R1010 Upper abdominal pain, unspecified: Secondary | ICD-10-CM

## 2022-11-22 DIAGNOSIS — Z79899 Other long term (current) drug therapy: Secondary | ICD-10-CM | POA: Insufficient documentation

## 2022-11-22 LAB — CBC WITH DIFFERENTIAL (CANCER CENTER ONLY)
Abs Immature Granulocytes: 0.08 10*3/uL — ABNORMAL HIGH (ref 0.00–0.07)
Basophils Absolute: 0 10*3/uL (ref 0.0–0.1)
Basophils Relative: 0 %
Eosinophils Absolute: 0.1 10*3/uL (ref 0.0–0.5)
Eosinophils Relative: 0 %
HCT: 34.3 % — ABNORMAL LOW (ref 36.0–46.0)
Hemoglobin: 11.7 g/dL — ABNORMAL LOW (ref 12.0–15.0)
Immature Granulocytes: 1 %
Lymphocytes Relative: 17 %
Lymphs Abs: 2.4 10*3/uL (ref 0.7–4.0)
MCH: 31.5 pg (ref 26.0–34.0)
MCHC: 34.1 g/dL (ref 30.0–36.0)
MCV: 92.2 fL (ref 80.0–100.0)
Monocytes Absolute: 0.8 10*3/uL (ref 0.1–1.0)
Monocytes Relative: 6 %
Neutro Abs: 10.8 10*3/uL — ABNORMAL HIGH (ref 1.7–7.7)
Neutrophils Relative %: 76 %
Platelet Count: 209 10*3/uL (ref 150–400)
RBC: 3.72 MIL/uL — ABNORMAL LOW (ref 3.87–5.11)
RDW: 14.2 % (ref 11.5–15.5)
WBC Count: 14.2 10*3/uL — ABNORMAL HIGH (ref 4.0–10.5)
nRBC: 0 % (ref 0.0–0.2)

## 2022-11-22 LAB — CMP (CANCER CENTER ONLY)
ALT: 27 U/L (ref 0–44)
AST: 22 U/L (ref 15–41)
Albumin: 3.5 g/dL (ref 3.5–5.0)
Alkaline Phosphatase: 96 U/L (ref 38–126)
Anion gap: 9 (ref 5–15)
BUN: 11 mg/dL (ref 6–20)
CO2: 25 mmol/L (ref 22–32)
Calcium: 9.1 mg/dL (ref 8.9–10.3)
Chloride: 99 mmol/L (ref 98–111)
Creatinine: 0.62 mg/dL (ref 0.44–1.00)
GFR, Estimated: >60 mL/min (ref >60)
Glucose, Bld: 98 mg/dL (ref 70–99)
Potassium: 3.3 mmol/L — ABNORMAL LOW (ref 3.5–5.1)
Sodium: 133 mmol/L — ABNORMAL LOW (ref 135–145)
Total Bilirubin: 0.5 mg/dL (ref 0.3–1.2)
Total Protein: 6.2 g/dL — ABNORMAL LOW (ref 6.5–8.1)

## 2022-11-22 LAB — MAGNESIUM: Magnesium: 1.7 mg/dL (ref 1.7–2.4)

## 2022-11-22 MED ORDER — SODIUM CHLORIDE 0.9 % IV SOLN: Freq: Once | INTRAVENOUS | Status: AC

## 2022-11-22 MED ORDER — POTASSIUM CHLORIDE IN NACL 20-0.9 MEQ/L-% IV SOLN
Freq: Once | INTRAVENOUS | Status: AC
  Filled 2022-11-22: qty 1000

## 2022-11-22 MED ORDER — SODIUM CHLORIDE 0.9 % IV SOLN
46.0000 mg | Freq: Once | INTRAVENOUS | Status: AC
  Administered 2022-11-22: 46 mg via INTRAVENOUS
  Filled 2022-11-22: qty 46

## 2022-11-22 MED ORDER — SODIUM CHLORIDE 0.9% FLUSH
10.0000 mL | INTRAVENOUS | Status: DC | PRN
  Administered 2022-11-22: 10 mL

## 2022-11-22 MED ORDER — PALONOSETRON HCL INJECTION 0.25 MG/5ML
0.2500 mg | Freq: Once | INTRAVENOUS | Status: AC
  Administered 2022-11-22: 0.25 mg via INTRAVENOUS
  Filled 2022-11-22: qty 5

## 2022-11-22 MED ORDER — SODIUM CHLORIDE 0.9 % IV SOLN
10.0000 mg | Freq: Once | INTRAVENOUS | Status: AC
  Administered 2022-11-22: 10 mg via INTRAVENOUS
  Filled 2022-11-22: qty 10

## 2022-11-22 MED ORDER — SODIUM CHLORIDE 0.9 % IV SOLN
1000.0000 mg/m2 | Freq: Once | INTRAVENOUS | Status: AC
  Administered 2022-11-22: 1825 mg via INTRAVENOUS
  Filled 2022-11-22: qty 48

## 2022-11-22 MED ORDER — HEPARIN SOD (PORK) LOCK FLUSH 100 UNIT/ML IV SOLN
500.0000 [IU] | Freq: Once | INTRAVENOUS | Status: AC | PRN
  Administered 2022-11-22: 500 [IU]

## 2022-11-22 MED ORDER — SODIUM CHLORIDE 0.9 % IV SOLN
150.0000 mg | Freq: Once | INTRAVENOUS | Status: AC
  Administered 2022-11-22: 150 mg via INTRAVENOUS
  Filled 2022-11-22: qty 150

## 2022-11-22 MED ORDER — MAGNESIUM SULFATE 2 GM/50ML IV SOLN
2.0000 g | Freq: Once | INTRAVENOUS | Status: AC
  Administered 2022-11-22: 2 g via INTRAVENOUS
  Filled 2022-11-22: qty 50

## 2022-11-22 MED ORDER — MIRTAZAPINE 7.5 MG PO TABS: 7.5000 mg | ORAL_TABLET | Freq: Every day | ORAL | 0 refills | Status: DC

## 2022-11-22 NOTE — Patient Instructions (Signed)
Instrucciones al darle de alta: Discharge Instructions Gracias por elegir al [facility] de Cncer de [place] para brindarle atencin mdica de oncologa y Music therapist.   Si usted tiene una cita de laboratorio con [place], por favor vaya directamente [place] y regstrese en el rea de Control and instrumentation engineer.   Use ropa cmoda y [place] para tener fcil acceso a las vas del Portacath (acceso venoso de Engineer, site duracin) o la lnea PICC (catter central colocado por va perifrica).   Nos esforzamos por ofrecerle tiempo de calidad con su proveedor. Es posible que tenga que volver a programar su cita si llega tarde (15 minutos o ms).  El llegar tarde le afecta a usted y a otros pacientes cuyas citas son posteriores a Merchandiser, retail.  Adems, si usted falta a tres o ms citas sin avisar a la oficina, puede ser retirado(a) de la clnica a discrecin del proveedor.      Para las solicitudes de renovacin de recetas, pida a su farmacia que se ponga en contacto con nuestra oficina y deje que transcurran 62 horas para que se complete el proceso de las renovaciones.    Hoy usted recibi los siguientes agentes de quimioterapia e/o inmunoterapia: gemcitabine and cisplatin      Para ayudar a prevenir las nuseas y los vmitos despus de su tratamiento, le recomendamos que tome su medicamento para las nuseas segn las indicaciones.  LOS SNTOMAS QUE DEBEN COMUNICARSE INMEDIATAMENTE SE INDICAN A CONTINUACIN: *FIEBRE SUPERIOR A 100.4 F (38 C) O MS *ESCALOFROS O SUDORACIN *NUSEAS Y VMITOS QUE NO SE CONTROLAN CON EL MEDICAMENTO PARA LAS NUSEAS *DIFICULTAD INUSUAL PARA RESPIRAR  *MORETONES O HEMORRAGIAS NO HABITUALES *PROBLEMAS URINARIOS (dolor o ardor al Garment/textile technologist o frecuencia para Garment/textile technologist) *PROBLEMAS INTESTINALES (diarrea inusual, estreimiento, dolor cerca del ano) SENSIBILIDAD EN LA BOCA Y EN LA GARGANTA CON O SIN LA PRESENCIA DE LCERAS (dolor de garganta, llagas en la boca o dolor de  muelas/dientes) ERUPCIN, HINCHAZN O DOLORES INUSUALES FLUJO VAGINAL INUSUAL O PICAZN/RASQUIA    Los puntos marcados con un asterisco ( *) indican una posible emergencia y debe hacer un seguimiento tan pronto como le sea posible o vaya al Departamento de Emergencias si se le presenta algn problema.  Por favor, muestre la [place] DE ADVERTENCIA DE Windy Canny DE ADVERTENCIA DE Benay Spice al registrarse en [address] de Emergencias y a la enfermera de triaje.  Si tiene preguntas despus de su visita o necesita cancelar o volver a programar su cita, por favor pngase en contacto con [place]  Dept: [phone] y [place]. Las horas de oficina son de 8:00 a.m. a 4:30 p.m. de lunes a viernes. Por favor, tenga en cuenta que los mensajes de voz que se dejan despus de las 4:00 p.m. posiblemente no se devolvern hasta el siguiente da de [place].  Cerramos los fines de semana y [employer]. En todo momento tiene acceso a una enfermera para preguntas urgentes. Por favor, llame al nmero principal de la clnica Dept: [phone] y [place] instrucciones.   Para cualquier pregunta que no sea de carcter urgente, tambin puede ponerse en contacto con su proveedor [employer] Ahora ofrecemos visitas electrnicas para cualquier persona mayor de 18 aos que solicite atencin mdica en lnea para los sntomas que no sean urgentes. Para ms detalles vaya a [email or web address].   Tambin puede bajar la aplicacin de MyChart! Vaya a la tienda de aplicaciones, busque "MyChart", abra la  aplicacin, seleccione [place], e ingrese con su nombre de usuario y la contrasea de Pharmacist, community.  Las mscaras son opcionales en los centros de Hotel manager. Si desea que su equipo de cuidados mdicos use una [employer] atienden, por favor hgaselo saber al personal. Bethann Berkshire una persona de apoyo que tenga por lo menos 16 aos  para que le acompae a sus citas.

## 2022-11-22 NOTE — Progress Notes (Signed)
[place]   [phone]) [phone] [phone]   Clinic Follow up Note   Patient Care Team: Geradine Girt, DO as PCP - General (Internal Medicine) Dwan Bolt, MD as Consulting Physician (General Surgery) Truitt Merle, MD as Consulting Physician (Hematology) Stites as Consulting Physician (Radiology)  Date of Service:  11/22/2022  CHIEF COMPLAINT: f/u of recurrent gallbladder cancer    CURRENT THERAPY:  First line gemcitabine/cisplatin on days 1 and 8 q21 days and durvalumab q21 days; starting 10/19/22    ASSESSMENT:  Michele Arnold is a 62 y.o. 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 -she started neoadjuvant chemo cisplatin, gemcitabine and durvalumab on 10/19/22, plan to have surgical resection after chemo   Abdominal pain -secondary to cancer recurrence at abdominal wall  -on Norco as needed  -she is on MS contin '15mg'$  q12h  -Her pain has much improved since she started chemotherapy, we discussed reduced MS Contin to once at night, and the use of hydrocodone as needed during the day  Fatigue, hair loss, constipation  -Secondary to chemotherapy -Patient had many questions regarding side effect and management of her chemo, I reviewed in detail with patient and her son  Depression -Started since cancer recurrence  -we discussed counseling and antidepressant medicine, -I will call in mirtazapine 7.5 mg daily for her   PLAN: - I recommend Pt to take MIRA LAX for constipation  -will cancel cycle 2-day 8, and start cycle 3-day 1 today, cycle 3-day 8 will be next Wednesday as scheduled. -  Proceed with C3  Gemcitabine/Cisplatin  -call in a prescription mirtazapine -repeat scan after 4 cycle of chemo. 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 Cholelithiasis with distended gallbladder. No CT evidence of acute cholecystitis. Consider further evaluation with right upper quadrant ultrasound as clinically indicated. 2. Punctate nonobstructive left nephrolithiasis. 3. Aortic Atherosclerosis (ICD10-I70.0).   07/21/2021 Imaging   ABD [place] RUQ IMPRESSION: 1. Cholelithiasis without evidence of acute cholecystitis. 2. Mild hepatic steatosis.     07/21/2021 Imaging   MRA/MRCP IMPRESSION: 1. No biliary ductal dilation. No choledocholithiasis. 2. Mild hepatic steatosis. 3. Cholelithiasis with dilation of the gallbladder and trace pericholecystic fluid. No gallbladder wall thickening or abnormal wall enhancement. Findings which are equivocal for acute cholecystitis. Consider further evaluation with nuclear medicine HIDA scan to assess cystic duct patency if clinically indicated.   07/22/2021 Imaging   HIDA SCAN IMPRESSION: Scintigraphic findings most consistent with acute cholecystitis.   07/23/2021 Surgery   Preop Dx:        Acute cholecystitis Postop Dx:      Acute cholecystitis with 1 cm stone impacted in the infundibulum Procedure:      Xi robotic cholecystectomy with ICG By Dr. Johnathan Hausen   07/23/2021 Pathology Results   FINAL MICROSCOPIC DIAGNOSIS:  A. GALLBLADDER, CHOLECYSTECTOMY:  - Invasive poorly differentiated adenocarcinoma, 4.3 cm, involving  gallbladder neck  - Carcinoma invades perimuscular soft tissue  - Cystic duct margin is focally positive for carcinoma  - Focally suspicious for lymphovascular invasion   Procedure: Cholecystectomy  Tumor Site: Gallbladder neck  Tumor Size:  4.3 cm  Histologic Type: Adenocarcinoma  Histologic Grade: G3: Poorly differentiated  Tumor Extension:  Tumor invades perimuscular connective tissue on the peritoneal side without serosal involvement  Margins:       Margin Status for Invasive Carcinoma: Cystic duct margin is positive for carcinoma  Regional Lymph Nodes: Not applicable (no lymph nodes submitted or found)  Distant Metastasis:       Distant Site(s) Involved: Not applicable  Pathologic Stage Classification (pTNM, AJCC 8th Edition): pT2a, pN not assigned    08/20/2021 Imaging   CT chest IMPRESSION: Negative. No CT evidence for acute intrathoracic abnormality. Negative for pulmonary nodule or evidence for metastatic disease to the chest.   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 Michaelle Birks Staging laparoscopy Exploratory laparotomy with intraoperative cholangiogram Excision of new cystic duct margin Partial central hepatectomy (resection of gallbladder fossa - segments 4b and 5) Portal lymph node dissection   09/04/2021 Pathology Results   FINAL MICROSCOPIC DIAGNOSIS:  A. CYSTIC DUCT, NEW MARGIN, EXCISION:  -  No adenocarcinoma identified  B. LYMPH NODE, PORTAL, EXCISION:  -  Nodular fat necrosis and fibrosis  -  No nodal tissue identified  C. GALLBLADDER, FOSSA, EXCISION:  -  Benign liver with periductular chronic inflammation and  macrovesicular steatosis  -  No adenocarcinoma identified  D. LYMPH NODE, STATION EIGHT, EXCISION:  -  No adenocarcinoma identified in one lymph node (0/1)    09/04/2021 Cancer Staging   Staging form: Gallbladder, AJCC 8th Edition - Clinical stage from 09/04/2021: Stage IIA (cT2a, cN0, cM0) - Signed by Alla Feeling, NP on 09/21/2021 Stage prefix: Initial diagnosis Total positive nodes: 0 Histologic grade (G): G3 Histologic grading system: 3 grade system Histologic sub-type: Adenocarcinoma   09/28/2021 - 09/28/2021 Chemotherapy   Patient is on Treatment  Plan : BREAST Capecitabine q21d     09/30/2022 Relapse/Recurrence   FINAL MICROSCOPIC DIAGNOSIS:  A. 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  It is composed of solid sheets and nests of atypical cells with variably enlarged round to oval irregular nuclei and a variable amount of eosinophilic focally vacuolated cytoplasm, and these sheets and nests are admixed with infiltrating irregular angulated glands lined by an atypical columnar epithelium composed of similar-appearing cells.  Glandular lumens contain mucin.  Focally these epithelia are contiguous and are present within the same sheet and/or gland. Five immunohistochemical stains performed with adequate control. The tumor is positive for cytokeratin 7 and the solid sheets and nests are positive for the squamous marker p40.  The tumor is negative for cytokeratin 20.  The tumor is also negative for the GI marker CDX2.  The tumor is negative for the hepatic marker arginase.  The prior cholecystectomy specimen ([id number]) is reviewed and this carcinoma shows identical cytohistomorphology as the previously diagnosed gallbladder adenocarcinoma.    10/01/2022 PET scan   IMPRESSION: 1. Progressive soft tissue mass involving the right lower quadrant ventral abdominal wall is intensely FDG avid compatible with recurrent tumor. 2. No signs of FDG avid nodal or solid organ metastasis. 3. Multiple fat containing ventral abdominal wall hernias. 4.  Aortic Atherosclerosis (ICD10-I70.0).   10/12/2022 - 10/12/2022 Chemotherapy   Patient is on Treatment Plan : BILIARY TRACT Cisplatin +  Gemcitabine D1,8 q21d     10/12/2022 - 10/12/2022 Chemotherapy   Patient is on Treatment Plan : BLADDER Durvalumab (10) q14d     10/19/2022 -  Chemotherapy    Patient is on Treatment Plan : BILIARY TRACT Cisplatin + Gemcitabine D1,8 q21d     10/19/2022 -  Chemotherapy   Patient is on Treatment Plan : BLADDER Durvalumab (10) q14d        INTERVAL HISTORY:  Michele Arnold is here for a follow up of recurrent gallbladder cancer     She was last seen by me on 10/28/22 She presents to the clinic accompanied by family member and interpreter. Pt states she had the flu this past week. She states she had recover well. Pt reports it was worse. Her foot taste like metallic, and she had  cramps in her upper and lower extremity, by rubbing her arms and stretching her legs it helps with the pain. Pt states someday she is constipating and its very little blood from straining Pt reports that her pain its not that bad , but from strain when going to the bathroom. Pt states her pain is more controlled. Pt son reports of pt havening some depression. Pt agrees to take medication for depression.    All other systems were reviewed with the patient and are negative.  MEDICAL HISTORY:  Past Medical History:  Diagnosis Date   Cancer [facility])    Hypertension     SURGICAL HISTORY: Past Surgical History:  Procedure Laterality Date   CHOLECYSTECTOMY  07/23/2021   INTRAOPERATIVE CHOLANGIOGRAM N/A 09/04/2021   Procedure: INTRAOPERATIVE CHOLANGIOGRAM;  Surgeon: Dwan Bolt, MD;  Location: [place];  Service: General;  Laterality: N/A;   LAPAROSCOPY N/A 09/04/2021   Procedure: STAGING LAPAROSCOPY;  Surgeon: Dwan Bolt, MD;  Location: [place];  Service: General;  Laterality: N/A;   LYMPH NODE DISSECTION N/A 09/04/2021   Procedure: PORTAL LYMPH NODE DISSECTION;  Surgeon: Dwan Bolt, MD;  Location: [place];  Service: General;  Laterality: N/A;   OPEN HEPATECTOMY  N/A 09/04/2021   Procedure: OPEN PARTIAL CENTRAL HEPATECTOMY;  Surgeon: Dwan Bolt, MD;  Location: [place];  Service: General;  Laterality: N/A;   PORTACATH PLACEMENT Right 10/15/2022   Procedure: INSERTION  PORT-A-CATH;  Surgeon: Dwan Bolt, MD;  Location: [place];  Service: General;  Laterality: Right;    I have reviewed the social history and family history with the patient and they are unchanged from previous note.  ALLERGIES:  has No Known Allergies.  MEDICATIONS:  Current Outpatient Medications  Medication Sig Dispense Refill   mirtazapine (REMERON) 7.5 MG tablet Take 1 tablet (7.5 mg total) by mouth at bedtime. 30 tablet 0   acetaminophen (TYLENOL) 325 MG tablet Take 2 tablets (650 mg total) by mouth every 6 (six) hours as needed for mild pain or fever. (Patient not taking: Reported on 10/14/2022)     atorvastatin (LIPITOR) 40 MG tablet Take 40 mg by mouth daily.     dexamethasone (DECADRON) 4 MG tablet Take 1 tablet (4 mg total) by mouth 2 (two) times daily with a meal. 30 tablet 0   hydrochlorothiazide (MICROZIDE) 12.5 MG capsule Take 12.5 mg by mouth daily.     HYDROcodone-acetaminophen (NORCO/VICODIN) 5-325 MG tablet Take 1 tablet by mouth every 6 (six) hours as needed for severe pain. (Patient taking differently: Take 1 tablet by mouth every 6 (six) hours.) 90 tablet 0   lidocaine-prilocaine (EMLA) cream Apply to affected area once (  Patient taking differently: Apply 1 Application topically as needed. Apply to affected area once) 30 g 3   lisinopril (ZESTRIL) 10 MG tablet Take 1 tablet (10 mg total) by mouth daily. 30 tablet 0   morphine (MS CONTIN) 15 MG 12 hr tablet Take 1 tablet (15 mg total) by mouth every 12 (twelve) hours. 60 tablet 0   ondansetron (ZOFRAN) 8 MG tablet Take 1 tablet (8 mg total) by mouth every 8 (eight) hours as needed for nausea or vomiting. Start on the third day after cisplatin. 30 tablet 1   prochlorperazine (COMPAZINE) 10 MG tablet Take 1 tablet (10 mg total) by mouth every 6 (six) hours as needed (Nausea or vomiting). 30 tablet 1   urea (CARMOL) 10 % cream Apply topically to hands and feet 2 to 3 times daily as needed 71 g 2   No current  facility-administered medications for this visit.    PHYSICAL EXAMINATION: ECOG PERFORMANCE STATUS: 1 - Symptomatic but completely ambulatory  Vitals:   11/22/22 0922  BP: (!) 145/76  Pulse: 78  Resp: 16  Temp: 98.4 F (36.9 C)  SpO2: 97%   Wt Readings from Last 3 Encounters:  11/22/22 150 lb (68 kg)  11/01/22 155 lb (70.3 kg)  10/28/22 156 lb 9.6 oz (71 kg)     GENERAL:alert, no distress and comfortable SKIN: skin color normal, no rashes or significant lesions EYES: normal, Conjunctiva are pink and non-injected, sclera clear  NEURO: alert & oriented x 3 with fluent speech ABDOMEN:(+) mass in the abdominal wall 3x6 6cm, non-tender and normal bowel sounds Musculoskeletal:no cyanosis of digits and no clubbing  NEURO: alert & oriented x 3 with fluent speech, no focal motor/sensory deficits  LABORATORY DATA:  I have reviewed the data as listed    Latest Ref Rng & Units 11/22/2022    8:44 AM 11/11/2022    7:55 AM 11/01/2022   11:31 AM  CBC  WBC 4.0 - 10.5 K/uL 14.2  13.6  8.2   Hemoglobin 12.0 - 15.0 g/dL 11.7  12.3  11.1   Hematocrit 36.0 - 46.0 % 34.3  36.1  33.2   Platelets 150 - 400 K/uL 209  473  365         Latest Ref Rng & Units 11/22/2022    8:44 AM 11/11/2022    7:55 AM 11/01/2022   11:31 AM  CMP  Glucose 70 - 99 mg/dL 98  166  143   BUN 6 - 20 mg/dL '11  20  14   '$ Creatinine 0.44 - 1.00 mg/dL 0.62  0.59  0.64   Sodium 135 - 145 mmol/L 133  136  134   Potassium 3.5 - 5.1 mmol/L 3.3  3.9  3.8   Chloride 98 - 111 mmol/L 99  100  101   CO2 22 - 32 mmol/L '25  26  25   '$ Calcium 8.9 - 10.3 mg/dL 9.1  9.2  9.3   Total Protein 6.5 - 8.1 g/dL 6.2  7.0  7.1   Total Bilirubin 0.3 - 1.2 mg/dL 0.5  0.5  0.9   Alkaline Phos 38 - 126 U/L 96  97  90   AST 15 - 41 U/L '22  17  18   '$ ALT 0 - 44 U/L '27  24  22       '$ RADIOGRAPHIC STUDIES: I have personally reviewed the radiological images as listed and agreed with the findings in the report. No results found.  Orders  Placed This Encounter  Procedures   CBC with Differential ([place] Only)    Standing Status:   Future    Standing Expiration Date:   12/13/2023   CMP ([place] only)    Standing Status:   Future    Standing Expiration Date:   12/13/2023   Magnesium    Standing Status:   Future    Standing Expiration Date:   12/13/2023   CBC with Differential ([facility] Only)    Standing Status:   Future    Standing Expiration Date:   12/20/2023   CMP ([place] only)    Standing Status:   Future    Standing Expiration Date:   12/20/2023   Magnesium    Standing Status:   Future    Standing Expiration Date:   12/20/2023   All questions were answered. The patient knows to call the clinic with any problems, questions or concerns. No barriers to learning was detected. The total time spent in the appointment was 40 minutes.     Truitt Merle, MD 11/22/2022   Felicity Coyer, CMA, am acting as scribe for Truitt Merle, MD.   I have reviewed the above documentation for accuracy and completeness, and I agree with the above.

## 2022-11-23 ENCOUNTER — Telehealth: Payer: Self-pay | Admitting: Hematology

## 2022-11-23 NOTE — Telephone Encounter (Signed)
Spoke with patient confirming upcoming appointments  

## 2022-11-24 ENCOUNTER — Other Ambulatory Visit: Payer: Self-pay

## 2022-11-29 NOTE — Progress Notes (Deleted)
[place]   [phone] [phone]   Clinic Follow up Note   Patient Care Team: Geradine Girt, DO as PCP - General (Internal Medicine) Dwan Bolt, MD as Consulting Physician (General Surgery) Truitt Merle, MD as Consulting Physician (Hematology) [place] as Consulting Physician (Radiology)  Date of Service:  11/29/2022  CHIEF COMPLAINT: f/u of  recurrent gallbladder cancer    CURRENT THERAPY:  First line gemcitabine/cisplatin on days 1 and 8 q21 days and durvalumab q21 days; starting 10/19/22     ASSESSMENT: *** Michele Arnold is a 62 y.o. female with   No problem-specific Assessment & Plan notes found for this encounter.  ***   PLAN: {Everything Dr. Burr Medico talks to pt about, including reviewing scans and labs. 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 Cholelithiasis with distended gallbladder. No CT evidence of acute cholecystitis. Consider further evaluation with right upper quadrant ultrasound as clinically indicated. 2. Punctate nonobstructive left nephrolithiasis. 3. Aortic Atherosclerosis (ICD10-I70.0).   07/21/2021 Imaging   ABD [place] RUQ IMPRESSION: 1. Cholelithiasis without evidence of acute cholecystitis. 2. Mild hepatic steatosis.     07/21/2021 Imaging   MRA/MRCP IMPRESSION: 1. No biliary ductal dilation. No choledocholithiasis. 2. Mild hepatic steatosis. 3. Cholelithiasis with dilation of the gallbladder and trace pericholecystic fluid. No gallbladder wall thickening or abnormal wall enhancement. Findings which are equivocal for acute cholecystitis. Consider further evaluation with nuclear medicine HIDA scan to assess cystic duct patency if clinically indicated.   07/22/2021 Imaging   HIDA SCAN IMPRESSION: Scintigraphic findings most consistent  with acute cholecystitis.   07/23/2021 Surgery   Preop Dx:        Acute cholecystitis Postop Dx:      Acute cholecystitis with 1 cm stone impacted in the infundibulum Procedure:      Xi robotic cholecystectomy with ICG By Dr. Johnathan Hausen   07/23/2021 Pathology Results   FINAL MICROSCOPIC DIAGNOSIS:  A. GALLBLADDER, CHOLECYSTECTOMY:  - Invasive poorly differentiated adenocarcinoma, 4.3 cm, involving  gallbladder neck  - Carcinoma invades perimuscular soft tissue  - Cystic duct margin is focally positive for carcinoma  - Focally suspicious for lymphovascular invasion   Procedure: Cholecystectomy  Tumor Site: Gallbladder neck  Tumor Size: 4.3 cm  Histologic Type: Adenocarcinoma  Histologic Grade: G3: Poorly differentiated  Tumor Extension: Tumor invades perimuscular connective tissue on the peritoneal side without serosal involvement  Margins:       Margin Status for Invasive Carcinoma: Cystic duct margin is positive for carcinoma  Regional Lymph Nodes: Not applicable (no lymph nodes submitted or found)  Distant Metastasis:       Distant Site(s) Involved: Not applicable  Pathologic Stage Classification (pTNM, AJCC 8th Edition): pT2a, pN not assigned    08/20/2021 Imaging   CT chest IMPRESSION: Negative. No CT evidence for acute intrathoracic abnormality. Negative for pulmonary nodule or evidence for metastatic disease to the chest.   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 Michaelle Birks Staging laparoscopy Exploratory laparotomy with intraoperative cholangiogram Excision of new cystic duct margin Partial central hepatectomy (resection of gallbladder fossa - segments 4b and 5) Portal lymph node dissection   09/04/2021 Pathology Results   FINAL MICROSCOPIC DIAGNOSIS:  A.  CYSTIC DUCT, NEW MARGIN, EXCISION:  -  No adenocarcinoma identified  B. LYMPH  NODE, PORTAL, EXCISION:  -  Nodular fat necrosis and fibrosis  -  No nodal tissue identified  C. GALLBLADDER, FOSSA, EXCISION:  -  Benign liver with periductular chronic inflammation and  macrovesicular steatosis  -  No adenocarcinoma identified  D. LYMPH NODE, STATION EIGHT, EXCISION:  -  No adenocarcinoma identified in one lymph node (0/1)    09/04/2021 Cancer Staging   Staging form: Gallbladder, AJCC 8th Edition - Clinical stage from 09/04/2021: Stage IIA (cT2a, cN0, cM0) - Signed by Alla Feeling, NP on 09/21/2021 Stage prefix: Initial diagnosis Total positive nodes: 0 Histologic grade (G): G3 Histologic grading system: 3 grade system Histologic sub-type: Adenocarcinoma   09/28/2021 - 09/28/2021 Chemotherapy   Patient is on Treatment Plan : BREAST Capecitabine q21d     09/30/2022 Relapse/Recurrence   FINAL MICROSCOPIC DIAGNOSIS:  A. 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  It is composed of solid sheets and nests of atypical cells with variably enlarged round to oval irregular nuclei and a variable amount of eosinophilic focally vacuolated cytoplasm, and these sheets and nests are admixed with infiltrating irregular angulated glands lined by an atypical columnar epithelium composed of similar-appearing cells.  Glandular lumens contain mucin.  Focally these epithelia are contiguous and are present within the same sheet and/or gland. Five immunohistochemical stains performed with adequate control. The tumor is positive for cytokeratin 7 and the solid sheets and nests are positive for the squamous marker p40.  The tumor is negative for cytokeratin 20.  The tumor is also negative for the GI marker CDX2.  The tumor is negative for the hepatic marker arginase.  The prior  cholecystectomy specimen [id number]) is reviewed and this carcinoma shows identical cytohistomorphology as the previously diagnosed gallbladder adenocarcinoma.    10/01/2022 PET scan   IMPRESSION: 1. Progressive soft tissue mass involving the right lower quadrant ventral abdominal wall is intensely FDG avid compatible with recurrent tumor. 2. No signs of FDG avid nodal or solid organ metastasis. 3. Multiple fat containing ventral abdominal wall hernias. 4.  Aortic Atherosclerosis (ICD10-I70.0).   10/12/2022 - 10/12/2022 Chemotherapy   Patient is on Treatment Plan : BILIARY TRACT Cisplatin + Gemcitabine D1,8 q21d     10/12/2022 - 10/12/2022 Chemotherapy   Patient is on Treatment Plan : BLADDER Durvalumab (10) q14d     10/19/2022 -  Chemotherapy   Patient is on Treatment Plan : BILIARY TRACT Cisplatin + Gemcitabine D1,8 q21d     10/19/2022 -  Chemotherapy   Patient is on Treatment Plan : BLADDER Durvalumab (10) q14d        INTERVAL HISTORY: *** Michele Arnold is here for a follow up of  recurrent gallbladder cancer    She was last seen by me on 11/22/2022 She presents to the clinic   All other systems were reviewed with the patient and are negative.  MEDICAL HISTORY:  Past Medical History:  Diagnosis Date   Cancer [facility])    Hypertension     SURGICAL HISTORY: Past Surgical History:  Procedure Laterality Date   CHOLECYSTECTOMY  07/23/2021   INTRAOPERATIVE CHOLANGIOGRAM N/A 09/04/2021   Procedure: INTRAOPERATIVE CHOLANGIOGRAM;  Surgeon: Dwan Bolt, MD;  Location: [place];  Service: General;  Laterality: N/A;   LAPAROSCOPY N/A 09/04/2021   Procedure: Manfred Shirts  LAPAROSCOPY;  Surgeon: Dwan Bolt, MD;  Location: [place];  Service: General;  Laterality: N/A;   LYMPH NODE DISSECTION N/A 09/04/2021   Procedure: PORTAL LYMPH NODE DISSECTION;  Surgeon: Dwan Bolt, MD;  Location: [place];  Service: General;  Laterality: N/A;   OPEN HEPATECTOMY  N/A 09/04/2021   Procedure: OPEN  PARTIAL CENTRAL HEPATECTOMY;  Surgeon: Dwan Bolt, MD;  Location: [place];  Service: General;  Laterality: N/A;   PORTACATH PLACEMENT Right 10/15/2022   Procedure: INSERTION PORT-A-CATH;  Surgeon: Dwan Bolt, MD;  Location: [place];  Service: General;  Laterality: Right;    I have reviewed the social history and family history with the patient and they are unchanged from previous note.  ALLERGIES:  has No Known Allergies.  MEDICATIONS:  Current Outpatient Medications  Medication Sig Dispense Refill   acetaminophen (TYLENOL) 325 MG tablet Take 2 tablets (650 mg total) by mouth every 6 (six) hours as needed for mild pain or fever. (Patient not taking: Reported on 10/14/2022)     atorvastatin (LIPITOR) 40 MG tablet Take 40 mg by mouth daily.     dexamethasone (DECADRON) 4 MG tablet Take 1 tablet (4 mg total) by mouth 2 (two) times daily with a meal. 30 tablet 0   hydrochlorothiazide (MICROZIDE) 12.5 MG capsule Take 12.5 mg by mouth daily.     HYDROcodone-acetaminophen (NORCO/VICODIN) 5-325 MG tablet Take 1 tablet by mouth every 6 (six) hours as needed for severe pain. (Patient taking differently: Take 1 tablet by mouth every 6 (six) hours.) 90 tablet 0   lidocaine-prilocaine (EMLA) cream Apply to affected area once (Patient taking differently: Apply 1 Application topically as needed. Apply to affected area once) 30 g 3   lisinopril (ZESTRIL) 10 MG tablet Take 1 tablet (10 mg total) by mouth daily. 30 tablet 0   mirtazapine (REMERON) 7.5 MG tablet Take 1 tablet (7.5 mg total) by mouth at bedtime. 30 tablet 0   morphine (MS CONTIN) 15 MG 12 hr tablet Take 1 tablet (15 mg total) by mouth every 12 (twelve) hours. 60 tablet 0   ondansetron (ZOFRAN) 8 MG tablet Take 1 tablet (8 mg total) by mouth every 8 (eight) hours as needed for nausea or vomiting. Start on the third day after cisplatin. 30 tablet 1   prochlorperazine (COMPAZINE) 10 MG tablet Take 1 tablet (10 mg total) by mouth every 6 (six)  hours as needed (Nausea or vomiting). 30 tablet 1   urea (CARMOL) 10 % cream Apply topically to hands and feet 2 to 3 times daily as needed 71 g 2   No current facility-administered medications for this visit.    PHYSICAL EXAMINATION: ECOG PERFORMANCE STATUS: {CHL ONC ECOG [id number]}  There were no vitals filed for this visit. Wt Readings from Last 3 Encounters:  11/22/22 150 lb (68 kg)  11/01/22 155 lb (70.3 kg)  10/28/22 156 lb 9.6 oz (71 kg)    {Only keep what was examined. If exam not performed, can use .CEXAM } GENERAL:alert, no distress and comfortable SKIN: skin color, texture, turgor are normal, no rashes or significant lesions EYES: normal, Conjunctiva are pink and non-injected, sclera clear {OROPHARYNX:no exudate, no erythema and lips, buccal mucosa, and tongue normal}  NECK: supple, thyroid normal size, non-tender, without nodularity LYMPH:  no palpable lymphadenopathy in the cervical, axillary {or inguinal} LUNGS: clear to auscultation and percussion with normal breathing effort HEART: regular rate & rhythm and no murmurs and no lower extremity edema ABDOMEN:abdomen  soft, non-tender and normal bowel sounds Musculoskeletal:no cyanosis of digits and no clubbing  NEURO: alert & oriented x 3 with fluent speech, no focal motor/sensory deficits  LABORATORY DATA:  I have reviewed the data as listed    Latest Ref Rng & Units 11/22/2022    8:44 AM 11/11/2022    7:55 AM 11/01/2022   11:31 AM  CBC  WBC 4.0 - 10.5 K/uL 14.2  13.6  8.2   Hemoglobin 12.0 - 15.0 g/dL 11.7  12.3  11.1   Hematocrit 36.0 - 46.0 % 34.3  36.1  33.2   Platelets 150 - 400 K/uL 209  473  365         Latest Ref Rng & Units 11/22/2022    8:44 AM 11/11/2022    7:55 AM 11/01/2022   11:31 AM  CMP  Glucose 70 - 99 mg/dL 98  166  143   BUN 6 - 20 mg/dL 11  20  14   $ Creatinine 0.44 - 1.00 mg/dL 0.62  0.59  0.64   Sodium 135 - 145 mmol/L 133  136  134   Potassium 3.5 - 5.1 mmol/L 3.3  3.9  3.8    Chloride 98 - 111 mmol/L 99  100  101   CO2 22 - 32 mmol/L 25  26  25   $ Calcium 8.9 - 10.3 mg/dL 9.1  9.2  9.3   Total Protein 6.5 - 8.1 g/dL 6.2  7.0  7.1   Total Bilirubin 0.3 - 1.2 mg/dL 0.5  0.5  0.9   Alkaline Phos 38 - 126 U/L 96  97  90   AST 15 - 41 U/L 22  17  18   $ ALT 0 - 44 U/L 27  24  22       $ RADIOGRAPHIC STUDIES: I have personally reviewed the radiological images as listed and agreed with the findings in the report. No results found.    No orders of the defined types were placed in this encounter.  All questions were answered. The patient knows to call the clinic with any problems, questions or concerns. No barriers to learning was detected. The total time spent in the appointment was {CHL ONC TIME VISIT - [id number].     Baldemar Friday, CMA 11/29/2022   I, Audry Riles, CMA, am acting as scribe for Truitt Merle, MD.   {Add scribe attestation statement}

## 2022-11-30 ENCOUNTER — Ambulatory Visit: Payer: No Typology Code available for payment source

## 2022-11-30 ENCOUNTER — Other Ambulatory Visit: Payer: No Typology Code available for payment source

## 2022-11-30 ENCOUNTER — Ambulatory Visit: Payer: No Typology Code available for payment source | Admitting: Hematology

## 2022-11-30 MED FILL — Dexamethasone Sodium Phosphate Inj 100 MG/10ML: INTRAMUSCULAR | Qty: 1 | Status: AC

## 2022-11-30 MED FILL — Fosaprepitant Dimeglumine For IV Infusion 150 MG (Base Eq): INTRAVENOUS | Qty: 5 | Status: AC

## 2022-11-30 NOTE — Assessment & Plan Note (Deleted)
-  diagnosed in 07/2021, s/p surgical resection  -she received adjuvant chemotherapy with Xeloda for 6 months.  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 -she started neoadjuvant chemo cisplatin, gemcitabine and durvalumab on 10/19/22, plan to have surgical resection after 3-4 months chemo  -she is tolerating chemo moderately well, with constipation, fatigue, hair loss etc. C2D8 chemo was cancelled

## 2022-11-30 NOTE — Assessment & Plan Note (Deleted)
-  secondary to cancer recurrence at abdominal wall  -on Norco as needed  -she is on MS contin '15mg'$  q12h  -Her pain has much improved since she started chemotherapy, we discussed reduced MS Contin to once at night, and the use of hydrocodone as needed during the day

## 2022-12-01 ENCOUNTER — Inpatient Hospital Stay: Payer: No Typology Code available for payment source | Admitting: Hematology

## 2022-12-01 ENCOUNTER — Inpatient Hospital Stay: Payer: No Typology Code available for payment source

## 2022-12-02 ENCOUNTER — Encounter: Payer: Self-pay | Admitting: Hematology

## 2022-12-02 ENCOUNTER — Other Ambulatory Visit: Payer: Self-pay

## 2022-12-02 ENCOUNTER — Inpatient Hospital Stay: Payer: No Typology Code available for payment source

## 2022-12-02 ENCOUNTER — Inpatient Hospital Stay (HOSPITAL_BASED_OUTPATIENT_CLINIC_OR_DEPARTMENT_OTHER): Payer: No Typology Code available for payment source | Admitting: Hematology

## 2022-12-02 ENCOUNTER — Inpatient Hospital Stay: Payer: Self-pay

## 2022-12-02 VITALS — BP 109/49 | HR 88 | Temp 99.8°F | Resp 17 | Wt 151.2 lb

## 2022-12-02 DIAGNOSIS — Z95828 Presence of other vascular implants and grafts: Secondary | ICD-10-CM

## 2022-12-02 DIAGNOSIS — C23 Malignant neoplasm of gallbladder: Secondary | ICD-10-CM

## 2022-12-02 DIAGNOSIS — R1010 Upper abdominal pain, unspecified: Secondary | ICD-10-CM

## 2022-12-02 LAB — CMP (CANCER CENTER ONLY)
ALT: 18 U/L (ref 0–44)
AST: 18 U/L (ref 15–41)
Albumin: 3 g/dL — ABNORMAL LOW (ref 3.5–5.0)
Alkaline Phosphatase: 94 U/L (ref 38–126)
Anion gap: 9 (ref 5–15)
BUN: 7 mg/dL (ref 6–20)
CO2: 21 mmol/L — ABNORMAL LOW (ref 22–32)
Calcium: 8.5 mg/dL — ABNORMAL LOW (ref 8.9–10.3)
Chloride: 98 mmol/L (ref 98–111)
Creatinine: 0.55 mg/dL (ref 0.44–1.00)
GFR, Estimated: >60 mL/min (ref >60)
Glucose, Bld: 129 mg/dL — ABNORMAL HIGH (ref 70–99)
Potassium: 3.6 mmol/L (ref 3.5–5.1)
Sodium: 128 mmol/L — ABNORMAL LOW (ref 135–145)
Total Bilirubin: 0.7 mg/dL (ref 0.3–1.2)
Total Protein: 6.1 g/dL — ABNORMAL LOW (ref 6.5–8.1)

## 2022-12-02 LAB — CBC WITH DIFFERENTIAL (CANCER CENTER ONLY)
Abs Immature Granulocytes: 0.06 10*3/uL (ref 0.00–0.07)
Basophils Absolute: 0 10*3/uL (ref 0.0–0.1)
Basophils Relative: 0 %
Eosinophils Absolute: 0 10*3/uL (ref 0.0–0.5)
Eosinophils Relative: 0 %
HCT: 29.8 % — ABNORMAL LOW (ref 36.0–46.0)
Hemoglobin: 10.5 g/dL — ABNORMAL LOW (ref 12.0–15.0)
Immature Granulocytes: 1 %
Lymphocytes Relative: 12 %
Lymphs Abs: 1.2 10*3/uL (ref 0.7–4.0)
MCH: 31.8 pg (ref 26.0–34.0)
MCHC: 35.2 g/dL (ref 30.0–36.0)
MCV: 90.3 fL (ref 80.0–100.0)
Monocytes Absolute: 0.5 10*3/uL (ref 0.1–1.0)
Monocytes Relative: 5 %
Neutro Abs: 8.3 10*3/uL — ABNORMAL HIGH (ref 1.7–7.7)
Neutrophils Relative %: 82 %
Platelet Count: 184 10*3/uL (ref 150–400)
RBC: 3.3 MIL/uL — ABNORMAL LOW (ref 3.87–5.11)
RDW: 14.6 % (ref 11.5–15.5)
WBC Count: 10.1 10*3/uL (ref 4.0–10.5)
nRBC: 0 % (ref 0.0–0.2)

## 2022-12-02 LAB — MAGNESIUM: Magnesium: 1.7 mg/dL (ref 1.7–2.4)

## 2022-12-02 LAB — TSH: TSH: 0.808 u[IU]/mL (ref 0.350–4.500)

## 2022-12-02 MED ORDER — ALTEPLASE 2 MG IJ SOLR
2.0000 mg | Freq: Once | INTRAMUSCULAR | Status: AC | PRN
  Administered 2022-12-02: 2 mg
  Filled 2022-12-02: qty 2

## 2022-12-02 MED ORDER — HEPARIN SOD (PORK) LOCK FLUSH 100 UNIT/ML IV SOLN
500.0000 [IU] | Freq: Once | INTRAVENOUS | Status: AC | PRN
  Administered 2022-12-02: 500 [IU]

## 2022-12-02 MED ORDER — MAGNESIUM SULFATE 2 GM/50ML IV SOLN
2.0000 g | Freq: Once | INTRAVENOUS | Status: AC
  Administered 2022-12-02: 2 g via INTRAVENOUS
  Filled 2022-12-02: qty 50

## 2022-12-02 MED ORDER — SODIUM CHLORIDE 0.9% FLUSH
10.0000 mL | INTRAVENOUS | Status: AC | PRN
  Administered 2022-12-02: 10 mL

## 2022-12-02 MED ORDER — PALONOSETRON HCL INJECTION 0.25 MG/5ML
0.2500 mg | Freq: Once | INTRAVENOUS | Status: AC
  Administered 2022-12-02: 0.25 mg via INTRAVENOUS
  Filled 2022-12-02: qty 5

## 2022-12-02 MED ORDER — SODIUM CHLORIDE 0.9 % IV SOLN
150.0000 mg | Freq: Once | INTRAVENOUS | Status: AC
  Administered 2022-12-02: 150 mg via INTRAVENOUS
  Filled 2022-12-02: qty 150

## 2022-12-02 MED ORDER — SODIUM CHLORIDE 0.9 % IV SOLN: Freq: Once | INTRAVENOUS | Status: AC

## 2022-12-02 MED ORDER — SODIUM CHLORIDE 0.9 % IV SOLN
1500.0000 mg | Freq: Once | INTRAVENOUS | Status: AC
  Administered 2022-12-02: 1500 mg via INTRAVENOUS
  Filled 2022-12-02: qty 30

## 2022-12-02 MED ORDER — SODIUM CHLORIDE 0.9% FLUSH
10.0000 mL | INTRAVENOUS | Status: DC | PRN
  Administered 2022-12-02: 10 mL

## 2022-12-02 MED ORDER — SODIUM CHLORIDE 0.9 % IV SOLN
24.5000 mg/m2 | Freq: Once | INTRAVENOUS | Status: AC
  Administered 2022-12-02: 45 mg via INTRAVENOUS
  Filled 2022-12-02: qty 45

## 2022-12-02 MED ORDER — POTASSIUM CHLORIDE IN NACL 20-0.9 MEQ/L-% IV SOLN
Freq: Once | INTRAVENOUS | Status: AC
  Filled 2022-12-02: qty 1000

## 2022-12-02 MED ORDER — SODIUM CHLORIDE 0.9 % IV SOLN
10.0000 mg | Freq: Once | INTRAVENOUS | Status: AC
  Administered 2022-12-02: 10 mg via INTRAVENOUS
  Filled 2022-12-02: qty 10

## 2022-12-02 MED ORDER — SODIUM CHLORIDE 0.9 % IV SOLN
1000.0000 mg/m2 | Freq: Once | INTRAVENOUS | Status: AC
  Administered 2022-12-02: 1825 mg via INTRAVENOUS
  Filled 2022-12-02: qty 48

## 2022-12-02 NOTE — Assessment & Plan Note (Signed)
-  secondary to cancer recurrence at abdominal wall  -on Norco as needed  -she is on MS contin '15mg'$  q12h  -Her pain has much improved since she started chemotherapy, we discussed reduced MS Contin to once at night, and the use of hydrocodone as needed during the day

## 2022-12-02 NOTE — Assessment & Plan Note (Signed)
diagnosed in 07/2021, s/p surgical resection  -she received adjuvant chemotherapy with Xeloda for 6 months.  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 -she started neoadjuvant chemo cisplatin, gemcitabine and durvalumab on 10/19/22, tolerating moderately well, plan to have surgical resection after 3-4 months chemo

## 2022-12-02 NOTE — Progress Notes (Signed)
Per Dr Burr Medico, ok to run post hydration fluids concurrently with cisplatin today.

## 2022-12-02 NOTE — Patient Instructions (Signed)
Instrucciones al darle de alta: Discharge Instructions Gracias por elegir al [facility] de Cncer de [place] para brindarle atencin mdica de oncologa y Music therapist.   Si usted tiene una cita de laboratorio con [place], por favor vaya directamente [place] y regstrese en el rea de Control and instrumentation engineer.   Use ropa cmoda y [place] para tener fcil acceso a las vas del Portacath (acceso venoso de Engineer, site duracin) o la lnea PICC (catter central colocado por va perifrica).   Nos esforzamos por ofrecerle tiempo de calidad con su proveedor. Es posible que tenga que volver a programar su cita si llega tarde (15 minutos o ms).  El llegar tarde le afecta a usted y a otros pacientes cuyas citas son posteriores a Merchandiser, retail.  Adems, si usted falta a tres o ms citas sin avisar a la oficina, puede ser retirado(a) de la clnica a discrecin del proveedor.      Para las solicitudes de renovacin de recetas, pida a su farmacia que se ponga en contacto con nuestra oficina y deje que transcurran 63 horas para que se complete el proceso de las renovaciones.    Hoy usted recibi los siguientes agentes de quimioterapia e/o inmunoterapia Imfinzi, Gemzar, Cisplatin      Para ayudar a prevenir las nuseas y los vmitos despus de su tratamiento, le recomendamos que tome su medicamento para las nuseas segn las indicaciones.  LOS SNTOMAS QUE DEBEN COMUNICARSE INMEDIATAMENTE SE INDICAN A CONTINUACIN: *FIEBRE SUPERIOR A 100.4 F (38 C) O MS *ESCALOFROS O SUDORACIN *NUSEAS Y VMITOS QUE NO SE CONTROLAN CON EL MEDICAMENTO PARA LAS NUSEAS *DIFICULTAD INUSUAL PARA RESPIRAR  *MORETONES O HEMORRAGIAS NO HABITUALES *PROBLEMAS URINARIOS (dolor o ardor al Garment/textile technologist o frecuencia para Garment/textile technologist) *PROBLEMAS INTESTINALES (diarrea inusual, estreimiento, dolor cerca del ano) SENSIBILIDAD EN LA BOCA Y EN LA GARGANTA CON O SIN LA PRESENCIA DE LCERAS (dolor de garganta, llagas en la boca o dolor de  muelas/dientes) ERUPCIN, HINCHAZN O DOLORES INUSUALES FLUJO VAGINAL INUSUAL O PICAZN/RASQUIA    Los puntos marcados con un asterisco ( *) indican una posible emergencia y debe hacer un seguimiento tan pronto como le sea posible o vaya al Departamento de Emergencias si se le presenta algn problema.  Por favor, muestre la [place] DE ADVERTENCIA DE Windy Canny DE ADVERTENCIA DE Benay Spice al registrarse en [address] de Emergencias y a la enfermera de triaje.  Si tiene preguntas despus de su visita o necesita cancelar o volver a programar su cita, por favor pngase en contacto con [place]  Dept: [phone] y [place]. Las horas de oficina son de 8:00 a.m. a 4:30 p.m. de lunes a viernes. Por favor, tenga en cuenta que los mensajes de voz que se dejan despus de las 4:00 p.m. posiblemente no se devolvern hasta el siguiente da de [place].  Cerramos los fines de semana y [employer]. En todo momento tiene acceso a una enfermera para preguntas urgentes. Por favor, llame al nmero principal de la clnica Dept: [phone] y [place] instrucciones.   Para cualquier pregunta que no sea de carcter urgente, tambin puede ponerse en contacto con su proveedor [employer] Ahora ofrecemos visitas electrnicas para cualquier persona mayor de 18 aos que solicite atencin mdica en lnea para los sntomas que no sean urgentes. Para ms detalles vaya a [email or web address].   Tambin puede bajar la aplicacin de MyChart! Vaya a la tienda de aplicaciones, busque "MyChart", abra la  aplicacin, seleccione [place], e ingrese con su nombre de usuario y la contrasea de Pharmacist, community.

## 2022-12-02 NOTE — Progress Notes (Signed)
[place]   [phone] [phone]   Clinic Follow up Note   Patient Care Team: Geradine Girt, DO as PCP - General (Internal Medicine) Dwan Bolt, MD as Consulting Physician (General Surgery) Truitt Merle, MD as Consulting Physician (Hematology) [place] as Consulting Physician (Radiology)  Date of Service:  12/02/2022  CHIEF COMPLAINT: f/u of recurrent gallbladder cancer   CURRENT THERAPY:  First line gemcitabine/cisplatin on days 1 and 8 q21 days and durvalumab q21 days; starting 10/19/22      ASSESSMENT:  Michele Arnold is a 62 y.o. 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 -she started neoadjuvant chemo cisplatin, gemcitabine and durvalumab on 10/19/22, tolerating moderately well, plan to have surgical resection after 3-4 months chemo  Abdominal pain -secondary to cancer recurrence at abdominal wall  -on Norco as needed  -she is on MS contin '15mg'$  q12h  -Her pain has much improved since she started chemotherapy, we discussed reduced MS Contin to once at night, and the use of hydrocodone as needed during the day  Hyponatremia -Probably related to dehydration -I encouraged her to increase salt intake in diet and drink more electrolytes fluids.  PLAN: -lab reviewed -proceed with C3D8 cisplatin and gemcitabine today -I went over the pt pain medication, she will increase MS Contin to twice a day, encourage the pt to drink and eat a little more. 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 Cholelithiasis with distended gallbladder. No CT evidence of acute cholecystitis. Consider further evaluation with right upper quadrant ultrasound as clinically indicated. 2. Punctate nonobstructive left nephrolithiasis. 3. Aortic Atherosclerosis (ICD10-I70.0).   07/21/2021 Imaging   ABD [place] RUQ IMPRESSION: 1. Cholelithiasis without evidence of acute cholecystitis. 2. Mild hepatic steatosis.     07/21/2021 Imaging   MRA/MRCP IMPRESSION: 1. No biliary ductal dilation. No choledocholithiasis. 2. Mild hepatic steatosis. 3. Cholelithiasis with dilation of the gallbladder and trace pericholecystic fluid. No gallbladder wall thickening or abnormal wall enhancement. Findings which are equivocal for acute cholecystitis. Consider further evaluation with nuclear medicine HIDA scan to assess cystic duct patency if clinically indicated.   07/22/2021 Imaging   HIDA SCAN IMPRESSION: Scintigraphic findings most consistent with acute cholecystitis.   07/23/2021 Surgery   Preop Dx:        Acute cholecystitis Postop Dx:      Acute cholecystitis with 1 cm stone impacted in the infundibulum Procedure:      Xi robotic cholecystectomy with ICG By Dr. Johnathan Hausen   07/23/2021 Pathology Results   FINAL MICROSCOPIC DIAGNOSIS:  A. GALLBLADDER, CHOLECYSTECTOMY:  - Invasive poorly differentiated adenocarcinoma, 4.3 cm, involving  gallbladder neck  - Carcinoma invades perimuscular soft tissue  - Cystic duct margin is focally positive for carcinoma  - Focally suspicious for lymphovascular invasion   Procedure: Cholecystectomy  Tumor Site: Gallbladder neck  Tumor Size: 4.3 cm  Histologic Type: Adenocarcinoma  Histologic Grade: G3: Poorly differentiated  Tumor Extension: Tumor invades perimuscular connective tissue on the peritoneal side without serosal  involvement  Margins:       Margin Status for Invasive Carcinoma: Cystic  duct margin is positive for carcinoma  Regional Lymph Nodes: Not applicable (no lymph nodes submitted or found)  Distant Metastasis:       Distant Site(s) Involved: Not applicable  Pathologic Stage Classification (pTNM, AJCC 8th Edition): pT2a, pN not assigned    08/20/2021 Imaging   CT chest IMPRESSION: Negative. No CT evidence for acute intrathoracic abnormality. Negative for pulmonary nodule or evidence for metastatic disease to the chest.   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 Michaelle Birks Staging laparoscopy Exploratory laparotomy with intraoperative cholangiogram Excision of new cystic duct margin Partial central hepatectomy (resection of gallbladder fossa - segments 4b and 5) Portal lymph node dissection   09/04/2021 Pathology Results   FINAL MICROSCOPIC DIAGNOSIS:  A. CYSTIC DUCT, NEW MARGIN, EXCISION:  -  No adenocarcinoma identified  B. LYMPH NODE, PORTAL, EXCISION:  -  Nodular fat necrosis and fibrosis  -  No nodal tissue identified  C. GALLBLADDER, FOSSA, EXCISION:  -  Benign liver with periductular chronic inflammation and  macrovesicular steatosis  -  No adenocarcinoma identified  D. LYMPH NODE, STATION EIGHT, EXCISION:  -  No adenocarcinoma identified in one lymph node (0/1)    09/04/2021 Cancer Staging   Staging form: Gallbladder, AJCC 8th Edition - Clinical stage from 09/04/2021: Stage IIA (cT2a, cN0, cM0) - Signed by Alla Feeling, NP on 09/21/2021 Stage prefix: Initial diagnosis Total positive nodes: 0 Histologic grade (G): G3 Histologic grading system: 3 grade system Histologic sub-type: Adenocarcinoma   09/28/2021 - 09/28/2021 Chemotherapy   Patient is on Treatment Plan : BREAST Capecitabine q21d     09/30/2022 Relapse/Recurrence   FINAL MICROSCOPIC DIAGNOSIS:  A. 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  It is composed of solid sheets and nests of atypical cells with variably enlarged round to oval irregular nuclei and a variable amount of eosinophilic focally vacuolated cytoplasm, and these sheets and nests are admixed with infiltrating irregular angulated glands lined by an atypical columnar epithelium composed of similar-appearing cells.  Glandular lumens contain mucin.  Focally these epithelia are contiguous and are present within the same sheet and/or gland. Five immunohistochemical stains performed with adequate control. The tumor is positive for cytokeratin 7 and the solid sheets and nests are positive for the squamous marker p40.  The tumor is negative for cytokeratin 20.  The tumor is also negative for the GI marker CDX2.  The tumor is negative for the hepatic marker arginase.  The prior cholecystectomy specimen ([id number]) is reviewed and this carcinoma shows identical cytohistomorphology as the previously diagnosed gallbladder adenocarcinoma.    10/01/2022 PET scan   IMPRESSION: 1. Progressive soft tissue mass involving the right lower quadrant ventral abdominal wall is intensely FDG avid compatible with recurrent tumor. 2. No signs of FDG avid nodal or solid organ metastasis. 3. Multiple fat containing ventral abdominal wall hernias. 4.  Aortic Atherosclerosis (ICD10-I70.0).   10/12/2022 - 10/12/2022 Chemotherapy   Patient is on Treatment Plan : BILIARY TRACT Cisplatin + Gemcitabine D1,8 q21d     10/12/2022 - 10/12/2022 Chemotherapy   Patient is on Treatment Plan : BLADDER Durvalumab (10) q14d  10/19/2022 -  Chemotherapy   Patient is on Treatment Plan : BILIARY TRACT Cisplatin + Gemcitabine D1,8 q21d     10/19/2022 -  Chemotherapy   Patient is on Treatment Plan :  BLADDER Durvalumab (10) q14d        INTERVAL HISTORY:  Michele Arnold is here for a follow up of  recurrent gallbladder cancer She was last seen by me on 11/22/2022 She presents to the clinic accompanied by family and interpreter. Pt states that her appetite is not the best. Pt states that her pain after chemo is still the same. Pain medicine makes herr drowsy. The last two night she has been very tired a sleepy. Pt states she can feel the in her abdomen.      All other systems were reviewed with the patient and are negative.  MEDICAL HISTORY:  Past Medical History:  Diagnosis Date   Cancer [facility])    Hypertension     SURGICAL HISTORY: Past Surgical History:  Procedure Laterality Date   CHOLECYSTECTOMY  07/23/2021   INTRAOPERATIVE CHOLANGIOGRAM N/A 09/04/2021   Procedure: INTRAOPERATIVE CHOLANGIOGRAM;  Surgeon: Dwan Bolt, MD;  Location: [place];  Service: General;  Laterality: N/A;   LAPAROSCOPY N/A 09/04/2021   Procedure: STAGING LAPAROSCOPY;  Surgeon: Dwan Bolt, MD;  Location: [place];  Service: General;  Laterality: N/A;   LYMPH NODE DISSECTION N/A 09/04/2021   Procedure: PORTAL LYMPH NODE DISSECTION;  Surgeon: Dwan Bolt, MD;  Location: [place];  Service: General;  Laterality: N/A;   OPEN HEPATECTOMY  N/A 09/04/2021   Procedure: OPEN PARTIAL CENTRAL HEPATECTOMY;  Surgeon: Dwan Bolt, MD;  Location: [place];  Service: General;  Laterality: N/A;   PORTACATH PLACEMENT Right 10/15/2022   Procedure: INSERTION PORT-A-CATH;  Surgeon: Dwan Bolt, MD;  Location: [place];  Service: General;  Laterality: Right;    I have reviewed the social history and family history with the patient and they are unchanged from previous note.  ALLERGIES:  has No Known Allergies.  MEDICATIONS:  Current Outpatient Medications  Medication Sig Dispense Refill   acetaminophen (TYLENOL) 325 MG tablet Take 2 tablets (650 mg total) by mouth every 6 (six) hours as needed for mild pain or  fever. (Patient not taking: Reported on 10/14/2022)     atorvastatin (LIPITOR) 40 MG tablet Take 40 mg by mouth daily.     dexamethasone (DECADRON) 4 MG tablet Take 1 tablet (4 mg total) by mouth 2 (two) times daily with a meal. 30 tablet 0   hydrochlorothiazide (MICROZIDE) 12.5 MG capsule Take 12.5 mg by mouth daily.     HYDROcodone-acetaminophen (NORCO/VICODIN) 5-325 MG tablet Take 1 tablet by mouth every 6 (six) hours as needed for severe pain. (Patient taking differently: Take 1 tablet by mouth every 6 (six) hours.) 90 tablet 0   lidocaine-prilocaine (EMLA) cream Apply to affected area once (Patient taking differently: Apply 1 Application topically as needed. Apply to affected area once) 30 g 3   lisinopril (ZESTRIL) 10 MG tablet Take 1 tablet (10 mg total) by mouth daily. 30 tablet 0   mirtazapine (REMERON) 7.5 MG tablet Take 1 tablet (7.5 mg total) by mouth at bedtime. 30 tablet 0   morphine (MS CONTIN) 15 MG 12 hr tablet Take 1 tablet (15 mg total) by mouth every 12 (twelve) hours. 60 tablet 0   ondansetron (ZOFRAN) 8 MG tablet Take 1 tablet (8 mg total) by mouth every 8 (eight) hours as needed for nausea or vomiting.  Start on the third day after cisplatin. 30 tablet 1   prochlorperazine (COMPAZINE) 10 MG tablet Take 1 tablet (10 mg total) by mouth every 6 (six) hours as needed (Nausea or vomiting). 30 tablet 1   urea (CARMOL) 10 % cream Apply topically to hands and feet 2 to 3 times daily as needed 71 g 2   No current facility-administered medications for this visit.   Facility-Administered Medications Ordered in Other Visits  Medication Dose Route Frequency Provider Last Rate Last Admin   0.9 %  sodium chloride infusion   Intravenous Once Truitt Merle, MD       CISplatin (PLATINOL) 45 mg in sodium chloride 0.9 % 250 mL chemo infusion  24.5 mg/m2 (Treatment Plan Recorded) Intravenous Once Truitt Merle, MD       dexamethasone (DECADRON) 10 mg in sodium chloride 0.9 % 50 mL IVPB  10 mg Intravenous  Once Truitt Merle, MD       durvalumab (IMFINZI) 1,500 mg in sodium chloride 0.9 % 100 mL chemo infusion  1,500 mg Intravenous Once Truitt Merle, MD       fosaprepitant (EMEND) 150 mg in sodium chloride 0.9 % 145 mL IVPB  150 mg Intravenous Once Truitt Merle, MD       gemcitabine (GEMZAR) 1,825 mg in sodium chloride 0.9 % 250 mL chemo infusion  1,000 mg/m2 (Treatment Plan Recorded) Intravenous Once Truitt Merle, MD       heparin lock flush 100 unit/mL  500 Units Intracatheter Once PRN Truitt Merle, MD       magnesium sulfate IVPB 2 g 50 mL  2 g Intravenous Once Truitt Merle, MD 50 mL/hr at 12/02/22 1003 2 g at 12/02/22 1003   palonosetron (ALOXI) injection 0.25 mg  0.25 mg Intravenous Once Truitt Merle, MD       sodium chloride flush (NS) 0.9 % injection 10 mL  10 mL Intracatheter PRN Truitt Merle, MD        PHYSICAL EXAMINATION: ECOG PERFORMANCE STATUS: 1 - Symptomatic but completely ambulatory  There were no vitals filed for this visit. Wt Readings from Last 3 Encounters:  12/02/22 151 lb 4 oz (68.6 kg)  11/22/22 150 lb (68 kg)  11/01/22 155 lb (70.3 kg)     GENERAL:alert, no distress and comfortable SKIN: skin color normal, no rashes or significant lesions EYES: normal, Conjunctiva are pink and non-injected, sclera clear  NEURO: alert & oriented x 3 with fluent speech ABDOMEN:abdomen soft, non-tender and normal bowel sounds(+) mass in the right lower quadrant   LABORATORY DATA:  I have reviewed the data as listed    Latest Ref Rng & Units 12/02/2022    8:39 AM 11/22/2022    8:44 AM 11/11/2022    7:55 AM  CBC  WBC 4.0 - 10.5 K/uL 10.1  14.2  13.6   Hemoglobin 12.0 - 15.0 g/dL 10.5  11.7  12.3   Hematocrit 36.0 - 46.0 % 29.8  34.3  36.1   Platelets 150 - 400 K/uL 184  209  473         Latest Ref Rng & Units 12/02/2022    8:39 AM 11/22/2022    8:44 AM 11/11/2022    7:55 AM  CMP  Glucose 70 - 99 mg/dL 129  98  166   BUN 6 - 20 mg/dL '7  11  20   '$ Creatinine 0.44 - 1.00 mg/dL 0.55  0.62  0.59    Sodium 135 - 145 mmol/L 128  133  136   Potassium 3.5 - 5.1 mmol/L 3.6  3.3  3.9   Chloride 98 - 111 mmol/L 98  99  100   CO2 22 - 32 mmol/L '21  25  26   '$ Calcium 8.9 - 10.3 mg/dL 8.5  9.1  9.2   Total Protein 6.5 - 8.1 g/dL 6.1  6.2  7.0   Total Bilirubin 0.3 - 1.2 mg/dL 0.7  0.5  0.5   Alkaline Phos 38 - 126 U/L 94  96  97   AST 15 - 41 U/L '18  22  17   '$ ALT 0 - 44 U/L '18  27  24       '$ RADIOGRAPHIC STUDIES: I have personally reviewed the radiological images as listed and agreed with the findings in the report. No results found.    No orders of the defined types were placed in this encounter.  All questions were answered. The patient knows to call the clinic with any problems, questions or concerns. No barriers to learning was detected. The total time spent in the appointment was 30 minutes.     Truitt Merle, MD 12/02/2022   Felicity Coyer, CMA, am acting as scribe for Truitt Merle, MD.   I have reviewed the above documentation for accuracy and completeness, and I agree with the above.

## 2022-12-03 ENCOUNTER — Telehealth: Payer: Self-pay | Admitting: Hematology

## 2022-12-03 LAB — T4: T4, Total: 9.2 ug/dL (ref 4.5–12.0)

## 2022-12-03 NOTE — Telephone Encounter (Signed)
Tried to call patient received no answer unable to leave a vm

## 2022-12-05 ENCOUNTER — Other Ambulatory Visit: Payer: Self-pay

## 2022-12-07 ENCOUNTER — Other Ambulatory Visit: Payer: Self-pay

## 2022-12-08 ENCOUNTER — Other Ambulatory Visit: Payer: Commercial Managed Care - HMO

## 2022-12-08 ENCOUNTER — Encounter: Payer: Commercial Managed Care - HMO | Admitting: Dietician

## 2022-12-08 ENCOUNTER — Ambulatory Visit: Payer: Commercial Managed Care - HMO | Admitting: Hematology

## 2022-12-08 ENCOUNTER — Ambulatory Visit: Payer: Commercial Managed Care - HMO

## 2022-12-13 ENCOUNTER — Ambulatory Visit: Payer: No Typology Code available for payment source

## 2022-12-13 ENCOUNTER — Ambulatory Visit: Payer: No Typology Code available for payment source | Admitting: Hematology

## 2022-12-13 ENCOUNTER — Other Ambulatory Visit: Payer: No Typology Code available for payment source

## 2022-12-16 MED FILL — Dexamethasone Sodium Phosphate Inj 100 MG/10ML: INTRAMUSCULAR | Qty: 1 | Status: AC

## 2022-12-16 MED FILL — Fosaprepitant Dimeglumine For IV Infusion 150 MG (Base Eq): INTRAVENOUS | Qty: 5 | Status: AC

## 2022-12-16 NOTE — Assessment & Plan Note (Signed)
-  secondary to cancer recurrence at abdominal wall  -on Norco as needed  -she is on MS contin '15mg'$  q12h  -Her pain has much improved since she started chemotherapy, we discussed reduced MS Contin to once at night, and the use of hydrocodone as needed during the day

## 2022-12-16 NOTE — Progress Notes (Signed)
[facility]   [phone] [phone]   Clinic Follow up Note   Patient Care Team: Geradine Girt, DO as PCP - General (Internal Medicine) Dwan Bolt, MD as Consulting Physician (General Surgery) Truitt Merle, MD as Consulting Physician (Hematology) [place] as Consulting Physician (Radiology)  Date of Service:  12/17/2022  CHIEF COMPLAINT: f/u of recurrent gallbladder cancer   CURRENT THERAPY:  First line gemcitabine/cisplatin on days 1 and 8 q21 days and durvalumab q21 days; starting 10/19/22       ASSESSMENT:  Michele Arnold is a 62 y.o. 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 -she started neoadjuvant chemo cisplatin, gemcitabine and durvalumab on 10/19/22, tolerating moderately well, plan to have surgical resection after 3-4 months chemo -she has been tolerating chemo poorly, will hold today and repeat CT to evaluate her response to chemo   Abdominal pain -secondary to cancer recurrence at abdominal wall  -on Norco as needed  -she is on MS contin '15mg'$  q12h  -Her pain has much improved since she started chemotherapy, we discussed reduced MS Contin to once at night, and the use of hydrocodone as needed during the day   PLAN: --will hold Chemo today due to weakness and weight loss -Order CT scan -IV hydration today -f/u after CT   SUMMARY OF ONCOLOGIC HISTORY: Oncology History  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 Cholelithiasis with distended gallbladder. No CT evidence of acute cholecystitis. Consider further evaluation with right upper quadrant  ultrasound as clinically indicated. 2. Punctate nonobstructive left nephrolithiasis. 3. Aortic Atherosclerosis (ICD10-I70.0).   07/21/2021 Imaging   ABD [place] RUQ IMPRESSION: 1. Cholelithiasis without evidence of acute cholecystitis. 2. Mild hepatic steatosis.     07/21/2021 Imaging   MRA/MRCP IMPRESSION: 1. No biliary ductal dilation. No choledocholithiasis. 2. Mild hepatic steatosis. 3. Cholelithiasis with dilation of the gallbladder and trace pericholecystic fluid. No gallbladder wall thickening or abnormal wall enhancement. Findings which are equivocal for acute cholecystitis. Consider further evaluation with nuclear medicine HIDA scan to assess cystic duct patency if clinically indicated.   07/22/2021 Imaging   HIDA SCAN IMPRESSION: Scintigraphic findings most consistent with acute cholecystitis.   07/23/2021 Surgery   Preop Dx:        Acute cholecystitis Postop Dx:      Acute cholecystitis with 1 cm stone impacted in the infundibulum Procedure:      Xi robotic cholecystectomy with ICG By Dr. Johnathan Hausen   07/23/2021 Pathology Results   FINAL MICROSCOPIC DIAGNOSIS:  A. GALLBLADDER, CHOLECYSTECTOMY:  - Invasive poorly differentiated adenocarcinoma, 4.3 cm, involving  gallbladder neck  - Carcinoma invades perimuscular soft tissue  - Cystic duct margin is focally positive for carcinoma  - Focally suspicious for lymphovascular invasion   Procedure: Cholecystectomy  Tumor Site: Gallbladder neck  Tumor Size: 4.3 cm  Histologic Type: Adenocarcinoma  Histologic Grade: G3: Poorly differentiated  Tumor Extension: Tumor invades perimuscular connective tissue on the peritoneal side without serosal involvement  Margins:       Margin Status for Invasive Carcinoma: Cystic duct margin is positive for carcinoma  Regional Lymph Nodes: Not applicable (no lymph nodes submitted or found)  Distant Metastasis:  Distant Site(s) Involved: Not applicable  Pathologic Stage Classification (pTNM,  AJCC 8th Edition): pT2a, pN not assigned    08/20/2021 Imaging   CT chest IMPRESSION: Negative. No CT evidence for acute intrathoracic abnormality. Negative for pulmonary nodule or evidence for metastatic disease to the chest.   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 Michaelle Birks Staging laparoscopy Exploratory laparotomy with intraoperative cholangiogram Excision of new cystic duct margin Partial central hepatectomy (resection of gallbladder fossa - segments 4b and 5) Portal lymph node dissection   09/04/2021 Pathology Results   FINAL MICROSCOPIC DIAGNOSIS:  A. CYSTIC DUCT, NEW MARGIN, EXCISION:  -  No adenocarcinoma identified  B. LYMPH NODE, PORTAL, EXCISION:  -  Nodular fat necrosis and fibrosis  -  No nodal tissue identified  C. GALLBLADDER, FOSSA, EXCISION:  -  Benign liver with periductular chronic inflammation and  macrovesicular steatosis  -  No adenocarcinoma identified  D. LYMPH NODE, STATION EIGHT, EXCISION:  -  No adenocarcinoma identified in one lymph node (0/1)    09/04/2021 Cancer Staging   Staging form: Gallbladder, AJCC 8th Edition - Clinical stage from 09/04/2021: Stage IIA (cT2a, cN0, cM0) - Signed by Alla Feeling, NP on 09/21/2021 Stage prefix: Initial diagnosis Total positive nodes: 0 Histologic grade (G): G3 Histologic grading system: 3 grade system Histologic sub-type: Adenocarcinoma   09/28/2021 - 09/28/2021 Chemotherapy   Patient is on Treatment Plan : BREAST Capecitabine q21d     09/30/2022 Relapse/Recurrence   FINAL MICROSCOPIC DIAGNOSIS:  A. 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  It is composed of  solid sheets and nests of atypical cells with variably enlarged round to oval irregular nuclei and a variable amount of eosinophilic focally vacuolated cytoplasm, and these sheets and nests are admixed with infiltrating irregular angulated glands lined by an atypical columnar epithelium composed of similar-appearing cells.  Glandular lumens contain mucin.  Focally these epithelia are contiguous and are present within the same sheet and/or gland. Five immunohistochemical stains performed with adequate control. The tumor is positive for cytokeratin 7 and the solid sheets and nests are positive for the squamous marker p40.  The tumor is negative for cytokeratin 20.  The tumor is also negative for the GI marker CDX2.  The tumor is negative for the hepatic marker arginase.  The prior cholecystectomy specimen ([id number]) is reviewed and this carcinoma shows identical cytohistomorphology as the previously diagnosed gallbladder adenocarcinoma.    10/01/2022 PET scan   IMPRESSION: 1. Progressive soft tissue mass involving the right lower quadrant ventral abdominal wall is intensely FDG avid compatible with recurrent tumor. 2. No signs of FDG avid nodal or solid organ metastasis. 3. Multiple fat containing ventral abdominal wall hernias. 4.  Aortic Atherosclerosis (ICD10-I70.0).   10/12/2022 - 10/12/2022 Chemotherapy   Patient is on Treatment Plan : BILIARY TRACT Cisplatin + Gemcitabine D1,8 q21d     10/12/2022 - 10/12/2022 Chemotherapy   Patient is on Treatment Plan : BLADDER Durvalumab (10) q14d     10/19/2022 -  Chemotherapy   Patient is on Treatment Plan : BILIARY TRACT Cisplatin + Gemcitabine D1,8 q21d     10/19/2022 -  Chemotherapy   Patient is on Treatment Plan : BLADDER Durvalumab (10) q14d  INTERVAL HISTORY:  Michele Arnold is here for a follow up of  recurrent gallbladder cancer She was last seen by me on 12/02/2022 She presents to the clinic accompanied by family and interpreter.  Pt states she is very weak. Since the last chemo she has lost appetite and has not recover well. Pt reports she could only eat 3 spoonful of food , she was able to drink ensure and Pedialyte. She states she has pain on lowe abdomen. Pt reports from weakness in her knees.     All other systems were reviewed with the patient and are negative.  MEDICAL HISTORY:  Past Medical History:  Diagnosis Date   Cancer [facility])    Hypertension     SURGICAL HISTORY: Past Surgical History:  Procedure Laterality Date   CHOLECYSTECTOMY  07/23/2021   INTRAOPERATIVE CHOLANGIOGRAM N/A 09/04/2021   Procedure: INTRAOPERATIVE CHOLANGIOGRAM;  Surgeon: Dwan Bolt, MD;  Location: [facility];  Service: General;  Laterality: N/A;   LAPAROSCOPY N/A 09/04/2021   Procedure: STAGING LAPAROSCOPY;  Surgeon: Dwan Bolt, MD;  Location: [facility];  Service: General;  Laterality: N/A;   LYMPH NODE DISSECTION N/A 09/04/2021   Procedure: PORTAL LYMPH NODE DISSECTION;  Surgeon: Dwan Bolt, MD;  Location: [facility];  Service: General;  Laterality: N/A;   OPEN HEPATECTOMY  N/A 09/04/2021   Procedure: OPEN PARTIAL CENTRAL HEPATECTOMY;  Surgeon: Dwan Bolt, MD;  Location: [facility];  Service: General;  Laterality: N/A;   PORTACATH PLACEMENT Right 10/15/2022   Procedure: INSERTION PORT-A-CATH;  Surgeon: Dwan Bolt, MD;  Location: [facility];  Service: General;  Laterality: Right;    I have reviewed the social history and family history with the patient and they are unchanged from previous note.  ALLERGIES:  has No Known Allergies.  MEDICATIONS:  Current Outpatient Medications  Medication Sig Dispense Refill   acetaminophen (TYLENOL) 325 MG tablet Take 2 tablets (650 mg total) by mouth every 6 (six) hours as needed for mild pain or fever. (Patient not taking: Reported on 10/14/2022)     atorvastatin (LIPITOR) 40 MG tablet Take 40 mg by mouth daily.     dexamethasone (DECADRON) 4 MG tablet Take 1 tablet (4 mg total) by mouth 2  (two) times daily with a meal. 30 tablet 0   hydrochlorothiazide (MICROZIDE) 12.5 MG capsule Take 12.5 mg by mouth daily.     HYDROcodone-acetaminophen (NORCO/VICODIN) 5-325 MG tablet Take 1 tablet by mouth every 6 (six) hours as needed for severe pain. (Patient taking differently: Take 1 tablet by mouth every 6 (six) hours.) 90 tablet 0   lidocaine-prilocaine (EMLA) cream Apply to affected area once (Patient taking differently: Apply 1 Application topically as needed. Apply to affected area once) 30 g 3   lisinopril (ZESTRIL) 10 MG tablet Take 1 tablet (10 mg total) by mouth daily. 30 tablet 0   mirtazapine (REMERON) 7.5 MG tablet Take 1 tablet (7.5 mg total) by mouth at bedtime. 30 tablet 0   morphine (MS CONTIN) 15 MG 12 hr tablet Take 1 tablet (15 mg total) by mouth every 12 (twelve) hours. 60 tablet 0   nystatin (MYCOSTATIN) 100000 UNIT/ML suspension Take 5 mLs by mouth 4 times daily. Swish and swallow. 60 mL 0   ondansetron (ZOFRAN) 8 MG tablet Take 1 tablet (8 mg total) by mouth every 8 (eight) hours as needed for nausea or vomiting. Start on the third day after cisplatin. 30 tablet 1   prochlorperazine (COMPAZINE) 10 MG tablet Take  1 tablet (10 mg total) by mouth every 6 (six) hours as needed (Nausea or vomiting). 30 tablet 1   urea (CARMOL) 10 % cream Apply topically to hands and feet 2 to 3 times daily as needed 71 g 2   No current facility-administered medications for this visit.    PHYSICAL EXAMINATION: ECOG PERFORMANCE STATUS: 2 - Symptomatic, <50% confined to bed  Vitals:   12/17/22 0926  BP: 125/72  Pulse: 97  Resp: 18  Temp: 98.2 F (36.8 C)  SpO2: 100%   Wt Readings from Last 3 Encounters:  12/17/22 144 lb 3.2 oz (65.4 kg)  12/02/22 151 lb 4 oz (68.6 kg)  11/22/22 150 lb (68 kg)     ABDOMEN:( +) abdomen 3cm 3x4  subcutaneous mass (+) tender and normal bowel sounds Musculoskeletal:no cyanosis of digits and no clubbing  NEURO: alert & oriented x 3 with fluent speech,  no focal motor/sensory deficits  LABORATORY DATA:  I have reviewed the data as listed    Latest Ref Rng & Units 12/17/2022    9:21 AM 12/02/2022    8:39 AM 11/22/2022    8:44 AM  CBC  WBC 4.0 - 10.5 K/uL 7.6  10.1  14.2   Hemoglobin 12.0 - 15.0 g/dL 9.5  10.5  11.7   Hematocrit 36.0 - 46.0 % 28.0  29.8  34.3   Platelets 150 - 400 K/uL 492  184  209         Latest Ref Rng & Units 12/17/2022    8:51 AM 12/02/2022    8:39 AM 11/22/2022    8:44 AM  CMP  Glucose 70 - 99 mg/dL 129  129  98   BUN 6 - 20 mg/dL '9  7  11   '$ Creatinine 0.44 - 1.00 mg/dL 0.48  0.55  0.62   Sodium 135 - 145 mmol/L 132  128  133   Potassium 3.5 - 5.1 mmol/L 4.3  3.6  3.3   Chloride 98 - 111 mmol/L 99  98  99   CO2 22 - 32 mmol/L '24  21  25   '$ Calcium 8.9 - 10.3 mg/dL 9.1  8.5  9.1   Total Protein 6.5 - 8.1 g/dL 6.6  6.1  6.2   Total Bilirubin 0.3 - 1.2 mg/dL 0.6  0.7  0.5   Alkaline Phos 38 - 126 U/L 85  94  96   AST 15 - 41 U/L '21  18  22   '$ ALT 0 - 44 U/L '13  18  27       '$ RADIOGRAPHIC STUDIES: I have personally reviewed the radiological images as listed and agreed with the findings in the report. No results found.    Orders Placed This Encounter  Procedures   CT ABDOMEN PELVIS W CONTRAST    Standing Status:   Future    Standing Expiration Date:   12/18/2023    Order Specific Question:   If indicated for the ordered procedure, I authorize the administration of contrast media per Radiology protocol    Answer:   Yes    Order Specific Question:   Preferred imaging location?    Answer:   [facility]    Order Specific Question:   Is Oral Contrast requested for this exam?    Answer:   Yes, Per Radiology protocol    Order Specific Question:   Is patient pregnant?    Answer:   No    Order Specific Question:  Release to patient    Answer:   Immediate [1]   All questions were answered. The patient knows to call the clinic with any problems, questions or concerns. No barriers to learning was  detected. The total time spent in the appointment was 30 minutes.     Truitt Merle, MD 12/17/2022   Felicity Coyer, CMA, am acting as scribe for Truitt Merle, MD.   I have reviewed the above documentation for accuracy and completeness, and I agree with the above.

## 2022-12-16 NOTE — Assessment & Plan Note (Signed)
diagnosed in 07/2021, s/p surgical resection  -she received adjuvant chemotherapy with Xeloda for 6 months.  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 -she started neoadjuvant chemo cisplatin, gemcitabine and durvalumab on 10/19/22, tolerating moderately well, plan to have surgical resection after 3-4 months chemo

## 2022-12-17 ENCOUNTER — Other Ambulatory Visit: Payer: Self-pay

## 2022-12-17 ENCOUNTER — Other Ambulatory Visit (HOSPITAL_COMMUNITY): Payer: Self-pay

## 2022-12-17 ENCOUNTER — Encounter: Payer: Self-pay | Admitting: Hematology

## 2022-12-17 ENCOUNTER — Inpatient Hospital Stay (HOSPITAL_BASED_OUTPATIENT_CLINIC_OR_DEPARTMENT_OTHER): Payer: Self-pay | Admitting: Hematology

## 2022-12-17 ENCOUNTER — Inpatient Hospital Stay: Payer: Self-pay | Admitting: Dietician

## 2022-12-17 ENCOUNTER — Inpatient Hospital Stay: Payer: Self-pay

## 2022-12-17 ENCOUNTER — Inpatient Hospital Stay: Payer: Self-pay | Attending: Hematology

## 2022-12-17 VITALS — BP 125/72 | HR 97 | Temp 98.2°F | Resp 18 | Ht 64.0 in | Wt 144.2 lb

## 2022-12-17 VITALS — BP 117/53 | HR 85 | Resp 17

## 2022-12-17 DIAGNOSIS — R109 Unspecified abdominal pain: Secondary | ICD-10-CM | POA: Insufficient documentation

## 2022-12-17 DIAGNOSIS — R5383 Other fatigue: Secondary | ICD-10-CM | POA: Insufficient documentation

## 2022-12-17 DIAGNOSIS — Z95828 Presence of other vascular implants and grafts: Secondary | ICD-10-CM

## 2022-12-17 DIAGNOSIS — R630 Anorexia: Secondary | ICD-10-CM | POA: Insufficient documentation

## 2022-12-17 DIAGNOSIS — C23 Malignant neoplasm of gallbladder: Secondary | ICD-10-CM | POA: Insufficient documentation

## 2022-12-17 DIAGNOSIS — R1010 Upper abdominal pain, unspecified: Secondary | ICD-10-CM

## 2022-12-17 LAB — CBC WITH DIFFERENTIAL (CANCER CENTER ONLY)
Abs Immature Granulocytes: 0.03 10*3/uL (ref 0.00–0.07)
Basophils Absolute: 0 10*3/uL (ref 0.0–0.1)
Basophils Relative: 0 %
Eosinophils Absolute: 0 10*3/uL (ref 0.0–0.5)
Eosinophils Relative: 0 %
HCT: 28 % — ABNORMAL LOW (ref 36.0–46.0)
Hemoglobin: 9.5 g/dL — ABNORMAL LOW (ref 12.0–15.0)
Immature Granulocytes: 0 %
Lymphocytes Relative: 20 %
Lymphs Abs: 1.6 10*3/uL (ref 0.7–4.0)
MCH: 31.9 pg (ref 26.0–34.0)
MCHC: 33.9 g/dL (ref 30.0–36.0)
MCV: 94 fL (ref 80.0–100.0)
Monocytes Absolute: 0.8 10*3/uL (ref 0.1–1.0)
Monocytes Relative: 11 %
Neutro Abs: 5.2 10*3/uL (ref 1.7–7.7)
Neutrophils Relative %: 69 %
Platelet Count: 492 10*3/uL — ABNORMAL HIGH (ref 150–400)
RBC: 2.98 MIL/uL — ABNORMAL LOW (ref 3.87–5.11)
RDW: 15.4 % (ref 11.5–15.5)
WBC Count: 7.6 10*3/uL (ref 4.0–10.5)
nRBC: 0 % (ref 0.0–0.2)

## 2022-12-17 LAB — CMP (CANCER CENTER ONLY)
ALT: 13 U/L (ref 0–44)
AST: 21 U/L (ref 15–41)
Albumin: 3.1 g/dL — ABNORMAL LOW (ref 3.5–5.0)
Alkaline Phosphatase: 85 U/L (ref 38–126)
Anion gap: 9 (ref 5–15)
BUN: 9 mg/dL (ref 6–20)
CO2: 24 mmol/L (ref 22–32)
Calcium: 9.1 mg/dL (ref 8.9–10.3)
Chloride: 99 mmol/L (ref 98–111)
Creatinine: 0.48 mg/dL (ref 0.44–1.00)
GFR, Estimated: >60 mL/min (ref >60)
Glucose, Bld: 129 mg/dL — ABNORMAL HIGH (ref 70–99)
Potassium: 4.3 mmol/L (ref 3.5–5.1)
Sodium: 132 mmol/L — ABNORMAL LOW (ref 135–145)
Total Bilirubin: 0.6 mg/dL (ref 0.3–1.2)
Total Protein: 6.6 g/dL (ref 6.5–8.1)

## 2022-12-17 LAB — MAGNESIUM: Magnesium: 1.9 mg/dL (ref 1.7–2.4)

## 2022-12-17 MED ORDER — SODIUM CHLORIDE 0.9% FLUSH: 10.0000 mL | Freq: Once | INTRAVENOUS | Status: DC

## 2022-12-17 MED ORDER — NYSTATIN 100000 UNIT/ML MT SUSP
5.0000 mL | Freq: Four times a day (QID) | OROMUCOSAL | 0 refills | Status: DC
  Filled 2022-12-17: qty 60, 3d supply, fill #0

## 2022-12-17 MED ORDER — HEPARIN SOD (PORK) LOCK FLUSH 100 UNIT/ML IV SOLN
500.0000 [IU] | Freq: Once | INTRAVENOUS | Status: AC
  Administered 2022-12-17: 500 [IU]

## 2022-12-17 MED ORDER — SODIUM CHLORIDE 0.9% FLUSH
10.0000 mL | Freq: Once | INTRAVENOUS | Status: AC
  Administered 2022-12-17: 10 mL

## 2022-12-17 MED ORDER — SODIUM CHLORIDE 0.9 % IV SOLN: INTRAVENOUS | Status: AC

## 2022-12-17 NOTE — Patient Instructions (Signed)

## 2022-12-17 NOTE — Progress Notes (Unsigned)
Nutrition Follow-up:  Patient with gallbladder cancer. She is receiving cisplatin + gemcitabine q21d.  Met with patient during infusion. Interpretor present at visit today. Patient reports appetite is poor. Nothing taste good to her. She is drinking Ensure Original and yogurt smoothie drink. Patient asking which of these has less sugar and would be better for her. Patient reports she is brushing her teeth 2-3 times daily. She has noticed that her tongue is sore. RD noted white patchy tongue. RN informed and has notified MD of suspected thrush. Patient reports nausea is well controlled with medication.   Medications: reviewed   Labs: reviewed   Anthropometrics: Wt 144 lb 3.2 oz today decreased   1/18 - 151 lb 4 oz   NUTRITION DIAGNOSIS: Food and nutrition related knowledge deficit continues    INTERVENTION:  MD notified of suspected thrush - nystatin 22m QID  Educated patient to sanitize toothbrush, recommend new toothbrush s/p completion of antibiotics Educated on tips for altered taste - handout provided  Discussed nutrition facts label with yogurt and Ensure, encouraged pt to drink Ensure and suggested switching to Ensure Plus for added calories and protein BID One complimentary case of Ensure Complete provided     MONITORING, EVALUATION, GOAL: weight trends, intake    NEXT VISIT: To be scheduled as needed

## 2022-12-17 NOTE — Progress Notes (Signed)
Per Dr Burr Medico, hold chemo today, IVF only.

## 2022-12-20 ENCOUNTER — Encounter: Payer: Self-pay | Admitting: Hematology

## 2022-12-21 ENCOUNTER — Telehealth: Payer: Self-pay

## 2022-12-21 ENCOUNTER — Other Ambulatory Visit: Payer: Self-pay | Admitting: Nurse Practitioner

## 2022-12-21 ENCOUNTER — Other Ambulatory Visit: Payer: Self-pay

## 2022-12-21 MED ORDER — MORPHINE SULFATE ER 15 MG PO TBCR: 15.0000 mg | EXTENDED_RELEASE_TABLET | Freq: Two times a day (BID) | ORAL | 0 refills | Status: DC

## 2022-12-21 NOTE — Telephone Encounter (Signed)
Patients son called to order a refill of her MS Contin '15mg'$ . Forwarded the message to Dr. Burr Medico for refill

## 2022-12-22 ENCOUNTER — Other Ambulatory Visit: Payer: Self-pay

## 2022-12-22 ENCOUNTER — Telehealth: Payer: Self-pay | Admitting: Hematology

## 2022-12-22 NOTE — Telephone Encounter (Signed)
Per 2/2 IB, called pt 2x via interpreter, no answer and no VM

## 2022-12-23 ENCOUNTER — Encounter (HOSPITAL_BASED_OUTPATIENT_CLINIC_OR_DEPARTMENT_OTHER): Payer: Self-pay

## 2022-12-23 ENCOUNTER — Ambulatory Visit (HOSPITAL_BASED_OUTPATIENT_CLINIC_OR_DEPARTMENT_OTHER)
Admission: RE | Admit: 2022-12-23 | Discharge: 2022-12-23 | Disposition: A | Discharge status: Home / Self Care | Payer: Self-pay | Source: Ambulatory Visit | Attending: Hematology | Admitting: Hematology

## 2022-12-23 DIAGNOSIS — C23 Malignant neoplasm of gallbladder: Secondary | ICD-10-CM | POA: Insufficient documentation

## 2022-12-23 MED ORDER — IOHEXOL 300 MG/ML  SOLN
100.0000 mL | Freq: Once | INTRAMUSCULAR | Status: AC | PRN
  Administered 2022-12-23: 80 mL via INTRAVENOUS

## 2022-12-24 ENCOUNTER — Ambulatory Visit: Payer: No Typology Code available for payment source | Admitting: Hematology

## 2022-12-24 ENCOUNTER — Ambulatory Visit: Payer: No Typology Code available for payment source

## 2022-12-24 ENCOUNTER — Other Ambulatory Visit: Payer: No Typology Code available for payment source

## 2022-12-26 NOTE — Assessment & Plan Note (Signed)
-  secondary to cancer recurrence at abdominal wall  -on Norco as needed  -she is on MS contin '15mg'$  q12h  -Her pain has much improved since she started chemotherapy, we discussed reduced MS Contin to once at night, and the use of hydrocodone as needed during the day

## 2022-12-26 NOTE — Assessment & Plan Note (Signed)
diagnosed in 07/2021, s/p surgical resection  -she received adjuvant chemotherapy with Xeloda for 6 months.  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  CT scan also showed a new right lower lobe lung nodule, I will obtain a CT chest. -I have recommended changing chemotherapy to FOLFOX.  Potential benefit and side effects were discussed with patient, she agrees to proceed. -I will order genomic testing [facility] on her previously biopsy, to see if she is a candidate for targeted therapy.

## 2022-12-27 ENCOUNTER — Other Ambulatory Visit: Payer: Self-pay

## 2022-12-27 ENCOUNTER — Other Ambulatory Visit: Payer: Self-pay | Admitting: Hematology

## 2022-12-27 ENCOUNTER — Encounter: Payer: Self-pay | Admitting: Hematology

## 2022-12-27 ENCOUNTER — Inpatient Hospital Stay (HOSPITAL_BASED_OUTPATIENT_CLINIC_OR_DEPARTMENT_OTHER): Payer: Self-pay | Admitting: Hematology

## 2022-12-27 VITALS — BP 124/63 | HR 102 | Temp 98.7°F | Resp 16 | Ht 64.0 in | Wt 141.1 lb

## 2022-12-27 DIAGNOSIS — R1010 Upper abdominal pain, unspecified: Secondary | ICD-10-CM

## 2022-12-27 DIAGNOSIS — C23 Malignant neoplasm of gallbladder: Secondary | ICD-10-CM

## 2022-12-27 MED ORDER — MEGESTROL ACETATE 625 MG/5ML PO SUSP: 625.0000 mg | Freq: Every day | ORAL | 0 refills | Status: DC

## 2022-12-27 MED ORDER — OXYCODONE HCL 5 MG PO TABS: 5.0000 mg | ORAL_TABLET | Freq: Four times a day (QID) | ORAL | 0 refills | Status: DC | PRN

## 2022-12-27 NOTE — Progress Notes (Signed)
[facility] ordered on case#[id number] per Dr. Ernestina Penna request.  FO Order# [id number].  Fax confirmation received.

## 2022-12-27 NOTE — Progress Notes (Signed)
Michele Arnold   [phone] [phone]   Clinic Follow up Note   Patient Care Team: Geradine Girt, DO as PCP - General (Internal Medicine) Dwan Bolt, MD as Consulting Physician (General Surgery) Truitt Merle, MD as Consulting Physician (Hematology) [place] as Consulting Physician (Radiology)  Date of Service:  12/27/2022  CHIEF COMPLAINT: f/u of recurrent gallbladder cancer     CURRENT THERAPY:   First line gemcitabine/cisplatin on days 1 and 8 q21 days and durvalumab q21 days; starting 10/19/22      ASSESSMENT:  Michele Arnold is a 62 y.o. 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  CT scan also showed a new right lower lobe lung nodule, I will obtain a CT chest. -Due to her significant pain from her abdominal wall metastasis, I will ask radiation oncology to see if palliative radiation is feasible. -I have recommended changing chemotherapy to FOLFOX.  Potential benefit and side effects were discussed with patient, she agrees to proceed, will start after she completes radiation. -I will order genomic testing [facility] on her previously biopsy, to see if she is a candidate for targeted therapy.  Abdominal pain -secondary to cancer recurrence at abdominal wall  -on Norco as needed, I will change to oxycodone 5 mg as  needed -she is on MS contin 59m q12h     PLAN: - I prescribed Oxycodone 5 mg as needed for breakthrough pain, will continue MS Contin - Discuss CT scan, which unfortunately showed cancer progression - Order a CT chest scan 1-2weeks -Discuss with Dr.Allen and radiation oncology about surgery (unlikely) and palliative RT   Discussed switching chemo to second line Folfox and its side effects. -f/u in 2 weeks  SUMMARY OF ONCOLOGIC HISTORY: Oncology History  [place]  07[place] Imaging   CT AP IMPRESSION: 1. Cholelithiasis with distended gallbladder. No CT evidence of acute cholecystitis. Consider further evaluation with right upper quadrant ultrasound as clinically indicated. 2. Punctate nonobstructive left nephrolithiasis. 3. Aortic Atherosclerosis (ICD10-I70.0).   07/21/2021 Imaging   ABD [place]Q IMPRESSION: 1. Cholelithiasis without evidence of acute cholecystitis. 2. Mild hepatic steatosis.     07/21/2021 Imaging   MRA/MRCP IMPRESSION: 1. No biliary ductal dilation. No choledocholithiasis. 2. Mild hepatic steatosis. 3. Cholelithiasis with dilation of the gallbladder and trace pericholecystic fluid. No gallbladder wall thickening or abnormal wall enhancement. Findings which are equivocal for acute cholecystitis. Consider further evaluation with nuclear medicine HIDA scan to assess cystic duct patency if clinically indicated.   07/22/2021 Imaging   HIDA SCAN IMPRESSION: Scintigraphic findings most consistent with acute cholecystitis.   07/23/2021 Surgery   Preop Dx:        Acute cholecystitis Postop Dx:      Acute cholecystitis with 1 cm stone impacted in the infundibulum Procedure:      Xi robotic cholecystectomy with ICG By Dr. MJohnathan Hausen  07/23/2021 Pathology Results   FINAL MICROSCOPIC DIAGNOSIS:  A. GALLBLADDER,  CHOLECYSTECTOMY:  - Invasive poorly differentiated adenocarcinoma, 4.3 cm, involving  gallbladder neck  - Carcinoma invades  perimuscular soft tissue  - Cystic duct margin is focally positive for carcinoma  - Focally suspicious for lymphovascular invasion   Procedure: Cholecystectomy  Tumor Site: Gallbladder neck  Tumor Size: 4.3 cm  Histologic Type: Adenocarcinoma  Histologic Grade: G3: Poorly differentiated  Tumor Extension: Tumor invades perimuscular connective tissue on the peritoneal side without serosal involvement  Margins:       Margin Status for Invasive Carcinoma: Cystic duct margin is positive for carcinoma  Regional Lymph Nodes: Not applicable (no lymph nodes submitted or found)  Distant Metastasis:       Distant Site(s) Involved: Not applicable  Pathologic Stage Classification (pTNM, AJCC 8th Edition): pT2a, pN not assigned    08/20/2021 Imaging   CT chest IMPRESSION: Negative. No CT evidence for acute intrathoracic abnormality. Negative for pulmonary nodule or evidence for metastatic disease to the chest.   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 Michaelle Birks Staging laparoscopy Exploratory laparotomy with intraoperative cholangiogram Excision of new cystic duct margin Partial central hepatectomy (resection of gallbladder fossa - segments 4b and 5) Portal lymph node dissection   09/04/2021 Pathology Results   FINAL MICROSCOPIC DIAGNOSIS:  A. CYSTIC DUCT, NEW MARGIN, EXCISION:  -  No adenocarcinoma identified  B. LYMPH NODE, PORTAL, EXCISION:  -  Nodular fat necrosis and fibrosis  -  No nodal tissue identified  C. GALLBLADDER, FOSSA, EXCISION:  -  Benign liver with periductular chronic inflammation and  macrovesicular steatosis  -  No adenocarcinoma identified  D. LYMPH NODE, STATION EIGHT, EXCISION:  -  No adenocarcinoma identified in one lymph node (0/1)    09/04/2021 Cancer Staging   Staging form: Gallbladder, AJCC 8th Edition - Clinical stage from  09/04/2021: Stage IIA (cT2a, cN0, cM0) - Signed by Alla Feeling, NP on 09/21/2021 Stage prefix: Initial diagnosis Total positive nodes: 0 Histologic grade (G): G3 Histologic grading system: 3 grade system Histologic sub-type: Adenocarcinoma   09/28/2021 - 09/28/2021 Chemotherapy   Patient is on Treatment Plan : BREAST Capecitabine q21d     09/30/2022 Relapse/Recurrence   FINAL MICROSCOPIC DIAGNOSIS:  A. 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  It is composed of solid sheets and nests of atypical cells with variably enlarged round to oval irregular nuclei and a variable amount of eosinophilic focally vacuolated cytoplasm, and these sheets and nests are admixed with infiltrating irregular angulated glands lined by an atypical columnar epithelium composed of similar-appearing cells.  Glandular lumens contain mucin.  Focally these epithelia are contiguous and are present within the same sheet and/or gland. Five immunohistochemical stains performed with adequate control. The tumor is positive for cytokeratin 7 and the solid sheets and nests are positive for the squamous marker p40.  The tumor is negative for cytokeratin 20.  The tumor is also negative for the GI marker CDX2.  The tumor is negative for the hepatic marker arginase.  The prior cholecystectomy specimen [id number]) is reviewed and this carcinoma shows identical cytohistomorphology as the previously diagnosed gallbladder adenocarcinoma.    10/01/2022 PET scan   IMPRESSION: 1. Progressive soft tissue mass involving the right lower quadrant ventral abdominal wall is  intensely FDG avid compatible with recurrent tumor. 2. No signs of FDG avid nodal or solid organ metastasis. 3. Multiple fat containing ventral abdominal wall  hernias. 4.  Aortic Atherosclerosis (ICD10-I70.0).   10/12/2022 - 10/12/2022 Chemotherapy   Patient is on Treatment Plan : BILIARY TRACT Cisplatin + Gemcitabine D1,8 q21d     10/12/2022 - 10/12/2022 Chemotherapy   Patient is on Treatment Plan : BLADDER Durvalumab (10) q14d     10/19/2022 - 12/02/2022 Chemotherapy   Patient is on Treatment Plan : BILIARY TRACT Cisplatin + Gemcitabine D1,8 q21d     10/19/2022 - 12/02/2022 Chemotherapy   Patient is on Treatment Plan : BLADDER Durvalumab (10) q14d     12/23/2022 Imaging    IMPRESSION: 1. Interval increase in size of right lower quadrant ventral abdominal wall mass compatible with progression of metastatic disease. 2. Round nodular density is only seen on the first and second images localizing to the right middle lobe measuring 1.2 cm. This is incompletely characterized and may represent a new lung nodule. Consider more definitive characterization with CT of the chest. 3. Nonobstructing left renal calculi. 4. Fat containing umbilical hernia.   01/03/2023 -  Chemotherapy   Patient is on Treatment Plan : COLORECTAL FOLFOX q14d x 6 months        INTERVAL HISTORY:  Michele Arnold is here for a follow up of  She was last seen by recurrent gallbladder cancer   me on 12/17/2022 She presents to the clinic accompanied by family and interpreter. Pt state that she has pain that goes around the abdomen. Pt  states that the pain medicine doesn't take it away completely. Pt states she takes hydrocodone 2x a day. Pt reports her appetite is not good she is not eating. Pt states she has some Ensure and is ale to keep it down , she only drinks 1 a day. Pt has spoken with a dietician.      All other systems were reviewed with the patient and are negative.  MEDICAL HISTORY:  Past Medical History:  Diagnosis Date   Cancer [facility])    Hypertension     SURGICAL HISTORY: Past Surgical History:  Procedure Laterality Date   CHOLECYSTECTOMY  07/23/2021    INTRAOPERATIVE CHOLANGIOGRAM N/A 09/04/2021   Procedure: INTRAOPERATIVE CHOLANGIOGRAM;  Surgeon: Dwan Bolt, MD;  Location: [facility];  Service: General;  Laterality: N/A;   LAPAROSCOPY N/A 09/04/2021   Procedure: STAGING LAPAROSCOPY;  Surgeon: Dwan Bolt, MD;  Location: [facility];  Service: General;  Laterality: N/A;   LYMPH NODE DISSECTION N/A 09/04/2021   Procedure: PORTAL LYMPH NODE DISSECTION;  Surgeon: Dwan Bolt, MD;  Location: [facility];  Service: General;  Laterality: N/A;   OPEN HEPATECTOMY  N/A 09/04/2021   Procedure: OPEN PARTIAL CENTRAL HEPATECTOMY;  Surgeon: Dwan Bolt, MD;  Location: [facility];  Service: General;  Laterality: N/A;   PORTACATH PLACEMENT Right 10/15/2022   Procedure: INSERTION PORT-A-CATH;  Surgeon: Dwan Bolt, MD;  Location: [facility];  Service: General;  Laterality: Right;    I have reviewed the social history and family history with the patient and they are unchanged from previous note.  ALLERGIES:  has No Known Allergies.  MEDICATIONS:  Current Outpatient Medications  Medication Sig Dispense Refill   megestrol (MEGACE ES) 625 MG/5ML suspension Take 5 mLs (625 mg total) by mouth daily. 150 mL 0   oxyCODONE (OXY IR/ROXICODONE) 5 MG immediate release tablet Take 1 tablet (5 mg total) by mouth every  6 (six) hours as needed for severe pain. 30 tablet 0   acetaminophen (TYLENOL) 325 MG tablet Take 2 tablets (650 mg total) by mouth every 6 (six) hours as needed for mild pain or fever. (Patient not taking: Reported on 10/14/2022)     atorvastatin (LIPITOR) 40 MG tablet Take 40 mg by mouth daily.     dexamethasone (DECADRON) 4 MG tablet Take 1 tablet (4 mg total) by mouth 2 (two) times daily with a meal. 30 tablet 0   hydrochlorothiazide (MICROZIDE) 12.5 MG capsule Take 12.5 mg by mouth daily.     HYDROcodone-acetaminophen (NORCO/VICODIN) 5-325 MG tablet Take 1 tablet by mouth every 6 (six) hours as needed for severe pain. (Patient taking differently: Take 1  tablet by mouth every 6 (six) hours.) 90 tablet 0   lisinopril (ZESTRIL) 10 MG tablet Take 1 tablet (10 mg total) by mouth daily. 30 tablet 0   morphine (MS CONTIN) 15 MG 12 hr tablet Take 1 tablet (15 mg total) by mouth every 12 (twelve) hours. 60 tablet 0   nystatin (MYCOSTATIN) 100000 UNIT/ML suspension Take 5 mLs by mouth 4 times daily. Swish and swallow. 60 mL 0   urea (CARMOL) 10 % cream Apply topically to hands and feet 2 to 3 times daily as needed 71 g 2   No current facility-administered medications for this visit.    PHYSICAL EXAMINATION: ECOG PERFORMANCE STATUS: 2 - Symptomatic, <50% confined to bed  Vitals:   12/27/22 1123  BP: 124/63  Pulse: (!) 102  Resp: 16  Temp: 98.7 F (37.1 C)  SpO2: 99%   Wt Readings from Last 3 Encounters:  12/27/22 141 lb 1.6 oz (64 kg)  12/17/22 144 lb 3.2 oz (65.4 kg)  12/02/22 151 lb 4 oz (68.6 kg)     GENERAL:alert, no distress and comfortable SKIN: skin color normal, no rashes or significant lesions EYES: normal, Conjunctiva are pink and non-injected, sclera clear  NEURO: alert & oriented x 3 with fluent speech   LABORATORY DATA:  I have reviewed the data as listed    Latest Ref Rng & Units 12/17/2022    9:21 AM 12/02/2022    8:39 AM 11/22/2022    8:44 AM  CBC  WBC 4.0 - 10.5 K/uL 7.6  10.1  14.2   Hemoglobin 12.0 - 15.0 g/dL 9.5  10.5  11.7   Hematocrit 36.0 - 46.0 % 28.0  29.8  34.3   Platelets 150 - 400 K/uL 492  184  209         Latest Ref Rng & Units 12/17/2022    8:51 AM 12/02/2022    8:39 AM 11/22/2022    8:44 AM  CMP  Glucose 70 - 99 mg/dL 129  129  98   BUN 6 - 20 mg/dL 9  7  11   $ Creatinine 0.44 - 1.00 mg/dL 0.48  0.55  0.62   Sodium 135 - 145 mmol/L 132  128  133   Potassium 3.5 - 5.1 mmol/L 4.3  3.6  3.3   Chloride 98 - 111 mmol/L 99  98  99   CO2 22 - 32 mmol/L 24  21  25   $ Calcium 8.9 - 10.3 mg/dL 9.1  8.5  9.1   Total Protein 6.5 - 8.1 g/dL 6.6  6.1  6.2   Total Bilirubin 0.3 - 1.2 mg/dL 0.6  0.7  0.5    Alkaline Phos 38 - 126 U/L 85  94  96  AST 15 - 41 U/L 21  18  22   $ ALT 0 - 44 U/L 13  18  27       $ RADIOGRAPHIC STUDIES: I have personally reviewed the radiological images as listed and agreed with the findings in the report. No results found.    Orders Placed This Encounter  Procedures   CT CHEST WO CONTRAST    Standing Status:   Future    Standing Expiration Date:   12/28/2023    Order Specific Question:   Is patient pregnant?    Answer:   No    Order Specific Question:   Preferred imaging location?    Answer:   [facility]    Order Specific Question:   Release to patient    Answer:   Immediate   All questions were answered. The patient knows to call the clinic with any problems, questions or concerns. No barriers to learning was detected. The total time spent in the appointment was 40 minutes.     Truitt Merle, MD 12/27/2022   Felicity Coyer, CMA, am acting as scribe for Truitt Merle, MD.   I have reviewed the above documentation for accuracy and completeness, and I agree with the above.

## 2022-12-27 NOTE — Progress Notes (Signed)
DISCONTINUE OFF PATHWAY REGIMEN - Other   [id number]:Cisplatin IV D1,8 + Durvalumab 1,500 mg IV D1 + Gemcitabine IV D1,8 q21 Days for up to 8 Cycles Followed by Durvalumab 1,500 mg IV D1 q28 Days:   Cycles 1 through up to 8: A cycle is every 21 days:     Durvalumab      Gemcitabine      Cisplatin    Cycles 9 and beyond: A cycle is every 28 days:     Durvalumab   **Always confirm dose/schedule in your pharmacy ordering system**  REASON: Disease Progression PRIOR TREATMENT: Cisplatin IV D1,8 + Durvalumab 1,500 mg IV D1 + Gemcitabine IV D1,8 q21 Days for up to 8 Cycles Followed by Durvalumab 1,500 mg IV D1 q28 Days TREATMENT RESPONSE: Progressive Disease (PD)  START OFF PATHWAY REGIMEN - Other   [id number] (Leucovorin IV D1 + Fluorouracil IV D1/CIV D1,2 + Oxaliplatin IV D1) q14 Days:   A cycle is every 14 days:     Oxaliplatin      Leucovorin      Fluorouracil      Fluorouracil   **Always confirm dose/schedule in your pharmacy ordering system**  Patient Characteristics: Intent of Therapy: Non-Curative / Palliative Intent, Discussed with Patient

## 2022-12-28 ENCOUNTER — Inpatient Hospital Stay: Payer: Self-pay | Admitting: Licensed Clinical Social Worker

## 2022-12-28 ENCOUNTER — Other Ambulatory Visit: Payer: Self-pay

## 2022-12-28 DIAGNOSIS — C23 Malignant neoplasm of gallbladder: Secondary | ICD-10-CM

## 2022-12-28 NOTE — Progress Notes (Signed)
[place] CSW Progress Note  Clinical Education officer, museum  received request through medical provider to reach out to pt regarding gas cards and disability.  CSW contacted pt by phone utilizing interpreter services.  Per chart review pt was approved for the [employer] 11/22.  Pt states she did not use the grant as she was expecting the money to be mailed to her and did not access the funds.  Message sent to financial resource specialist requesting pt be contacted for instructions as to how to access the grant.  CSW also inquired about applying for Medicaid.  Pt states she has lived in the [place]. for approximately 40 years, but has only been a citizen for the past 2 years.  Pt reports she applied prior to getting citizenship and was denied.  CSW encouraged pt to apply for Medicaid and SSI to both acquire insurance coverage and a supplemental income.  Pt states her son recently went to the DSS to inquire about applying and will assist pt w/ applying.  Pt w/ no additional questions at this time.        Henriette Combs, LCSW

## 2022-12-29 ENCOUNTER — Telehealth: Payer: Self-pay

## 2022-12-29 NOTE — Telephone Encounter (Signed)
Pt's son called stating that the Megace ES Suspension is too expensive $900 and the pt cannot afford this medication.  Pt's son stated that if the prescription was for "Pill" form the cost will be $50.  Pt and son are requesting if the megesterol could be sent to the pt's pharmacy in [place] form.  Notified Dr. Burr Medico of the request.

## 2022-12-29 NOTE — Progress Notes (Signed)
GI Location of Tumor / Histology: Gallbladder- Mets to abdominal wall, RLL lung nodule.  Michele Arnold presented for restaging scans, following treatment for gallbladder cancer.  CT AP 12/23/2022: Right lower quadrant ventral abdominal wall mass has increased in size from the previous exam. This measures 10.8 x 13.1 cm.  On the previous exam this measured approximately 5.4 by 8.6 cm. There is progressive soft tissue stranding within the right lower quadrant subcutaneous fat.   Round nodular density is only seen on the first and second images localizing to the right middle lobe measuring 1.2 cm. This is incompletely characterized and may represent a new lung nodule.  PET 10/01/2022:Soft tissue mass involving the right lower quadrant ventral abdominal wall is again noted. Mass measures approximately 5.4 cm in thickness.  On the CT from 09/15/2022 this measured 4.7 cm in thickness. On the CT from 06/07/2022 this mass measured 1.9 cm in thickness.   Biopsies of Abdominal Wall Mass 09/30/2022  Gallbladder 07/23/2022   Past/Anticipated interventions by surgeon, if any:  Dr. Zenia Resides 10/12/2022 -Percutaneous biopsy 09/30/2022 -Cholecystectomy 07/23/2021 -Open partial central hepatectomy and portal lymph node sampling 09/04/2021. -Agree with proceeding with chemotherapy, Dr. Burr Medico is planning for gemcitabine, cisplatin and dervalumab.  -If after 3 months of systemic treatment, if there is no evidence of disease progression and no other metastatic disease identified, resection vs radiation of the abdominal wall recurrence can be considered. -I will see her back after 3 months of chemotherapy with new scans.    Past/Anticipated interventions by medical oncology, if any:  Dr. Burr Medico 12/27/2022 - I prescribed Oxycodone 5 mg as needed for breakthrough pain, will continue MS Contin - Discuss CT scan, which unfortunately showed cancer progression. - Order a CT chest scan 1-2weeks -Discuss with Dr.Allen and  radiation oncology about surgery (unlikely) and palliative RT   Discussed switching chemo to second line Folfox and its side effects. -f/u in 2 weeks   Weight changes, if any:   Bowel/Bladder complaints, if any:   Nausea / Vomiting, if any:   Pain issues, if any:    Any blood per rectum:     SAFETY ISSUES: Prior radiation?  Pacemaker/ICD?  Possible current pregnancy? Postmenopausal Is the patient on methotrexate?   Current Complaints/Details: Port Insertion

## 2022-12-30 ENCOUNTER — Ambulatory Visit
Admission: RE | Admit: 2022-12-30 | Discharge: 2022-12-30 | Disposition: A | Discharge status: Home / Self Care | Payer: Self-pay | Source: Ambulatory Visit | Attending: Radiation Oncology | Admitting: Radiation Oncology

## 2022-12-30 ENCOUNTER — Encounter: Payer: Self-pay | Admitting: Hematology

## 2022-12-30 ENCOUNTER — Encounter: Payer: Self-pay | Admitting: Radiation Oncology

## 2022-12-30 ENCOUNTER — Other Ambulatory Visit: Payer: Self-pay | Admitting: Hematology

## 2022-12-30 VITALS — BP 113/58 | HR 110 | Temp 97.8°F | Resp 18 | Ht 64.0 in | Wt 139.2 lb

## 2022-12-30 DIAGNOSIS — N281 Cyst of kidney, acquired: Secondary | ICD-10-CM | POA: Insufficient documentation

## 2022-12-30 DIAGNOSIS — Z7952 Long term (current) use of systemic steroids: Secondary | ICD-10-CM | POA: Insufficient documentation

## 2022-12-30 DIAGNOSIS — I10 Essential (primary) hypertension: Secondary | ICD-10-CM | POA: Insufficient documentation

## 2022-12-30 DIAGNOSIS — G893 Neoplasm related pain (acute) (chronic): Secondary | ICD-10-CM | POA: Insufficient documentation

## 2022-12-30 DIAGNOSIS — K429 Umbilical hernia without obstruction or gangrene: Secondary | ICD-10-CM | POA: Insufficient documentation

## 2022-12-30 DIAGNOSIS — R918 Other nonspecific abnormal finding of lung field: Secondary | ICD-10-CM | POA: Insufficient documentation

## 2022-12-30 DIAGNOSIS — Z79899 Other long term (current) drug therapy: Secondary | ICD-10-CM | POA: Insufficient documentation

## 2022-12-30 DIAGNOSIS — C23 Malignant neoplasm of gallbladder: Secondary | ICD-10-CM

## 2022-12-30 DIAGNOSIS — C792 Secondary malignant neoplasm of skin: Secondary | ICD-10-CM | POA: Insufficient documentation

## 2022-12-30 DIAGNOSIS — M47816 Spondylosis without myelopathy or radiculopathy, lumbar region: Secondary | ICD-10-CM | POA: Insufficient documentation

## 2022-12-30 MED ORDER — MEGESTROL ACETATE 40 MG PO TABS: 400.0000 mg | ORAL_TABLET | Freq: Every day | ORAL | 1 refills | Status: DC

## 2022-12-30 NOTE — Progress Notes (Signed)
Met with patient and family members regarding concern she had with [employer] and gas cards. Provided a copy of the expenses she had used and her balance and advised if she receives a gas card today that would be deducted from her balance which she has been using for medication. She verbalized understanding when son explained to her and declined a card.  Also, provided another expense sheet which explains other expenses the grant balance could be used for if she didn't need medications.

## 2023-01-02 ENCOUNTER — Other Ambulatory Visit: Payer: Self-pay

## 2023-01-03 ENCOUNTER — Ambulatory Visit
Admission: RE | Admit: 2023-01-03 | Discharge: 2023-01-03 | Disposition: A | Discharge status: Home / Self Care | Payer: Self-pay | Source: Ambulatory Visit | Attending: Radiation Oncology | Admitting: Radiation Oncology

## 2023-01-03 ENCOUNTER — Other Ambulatory Visit: Payer: Self-pay

## 2023-01-03 DIAGNOSIS — C23 Malignant neoplasm of gallbladder: Secondary | ICD-10-CM | POA: Insufficient documentation

## 2023-01-03 DIAGNOSIS — Z510 Encounter for antineoplastic radiation therapy: Secondary | ICD-10-CM | POA: Insufficient documentation

## 2023-01-03 DIAGNOSIS — C792 Secondary malignant neoplasm of skin: Secondary | ICD-10-CM | POA: Insufficient documentation

## 2023-01-03 NOTE — Progress Notes (Signed)
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   HISTORY OF PRESENT ILLNESS: Michele Arnold is a 62 y.o. female seen at the request of Dr. Burr Medico with a history of cholangiocarcinoma who was diagnosed with her cancer in September 2022. She underwent surgical resection and adjuvant chemotherapy. She developed recurrent disease in the abdominal wall noted in November of 2023 and biopsied and positive for malignancy consistent with her gallbladder primary. She had a CT on 09/15/22 that showed increase in her abdominal wall disease measuring up to 7.6 cm, previously 3 cm. A PET scan on 10/01/22  showed progressive disease in the right lower quadrant along the abdominal wall and no other areas of hypermetabolism and she began chemo/immunotherapy in December 2023 but restaging scans on 12/23/22 showed an increase her her abdominal wall disease measuring up to 13.1 cm, and an incidental 1.2 cm RML nodule in the lung. She is going to start additional line chemotherapy but is seen to consider palliative radiotherapy to the abdominal wall disease.    PREVIOUS RADIATION THERAPY: No   PAST MEDICAL HISTORY:  Past Medical History:  Diagnosis Date   Cancer ([place])    Hypertension        PAST SURGICAL HISTORY: Past Surgical History:  Procedure Laterality Date   CHOLECYSTECTOMY  07/23/2021   INTRAOPERATIVE CHOLANGIOGRAM N/A 09/04/2021   Procedure: INTRAOPERATIVE CHOLANGIOGRAM;  Surgeon: Dwan Bolt, MD;  Location: [place];  Service: General;  Laterality: N/A;   LAPAROSCOPY N/A 09/04/2021   Procedure: STAGING LAPAROSCOPY;  Surgeon: Dwan Bolt, MD;  Location: [place];  Service: General;   Laterality: N/A;   LYMPH NODE DISSECTION N/A 09/04/2021   Procedure: PORTAL LYMPH NODE DISSECTION;  Surgeon: Dwan Bolt, MD;  Location: [place];  Service: General;  Laterality: N/A;   OPEN HEPATECTOMY  N/A 09/04/2021   Procedure: OPEN PARTIAL CENTRAL HEPATECTOMY;  Surgeon: Dwan Bolt, MD;  Location: [place];  Service: General;  Laterality: N/A;   PORTACATH PLACEMENT Right 10/15/2022   Procedure: INSERTION PORT-A-CATH;  Surgeon: Dwan Bolt, MD;  Location: [place];  Service: General;  Laterality: Right;     FAMILY HISTORY:  Family History  Problem Relation Age of Onset   Cancer Maternal Aunt        unknown type     SOCIAL HISTORY:  reports that she has never smoked. She has never used smokeless tobacco. She reports that she does not currently use alcohol. She reports that she does not use drugs.   ALLERGIES: Patient has no known allergies.   MEDICATIONS:  Current Outpatient Medications  Medication Sig Dispense Refill   atorvastatin (LIPITOR) 40 MG tablet Take 40 mg by mouth daily.     dexamethasone (DECADRON) 4 MG tablet Take 1 tablet (4 mg total) by mouth 2 (two) times daily with a meal. 30 tablet 0   hydrochlorothiazide (MICROZIDE) 12.5 MG capsule Take 12.5 mg by mouth daily.     lisinopril (ZESTRIL) 10 MG tablet Take 1 tablet (10 mg total) by mouth daily. 30 tablet 0   morphine (MS CONTIN) 15 MG 12 hr tablet Take 1 tablet (15 mg total)  by mouth every 12 (twelve) hours. 60 tablet 0   nystatin (MYCOSTATIN) 100000 UNIT/ML suspension Take 5 mLs by mouth 4 times daily. Swish and swallow. 60 mL 0   oxyCODONE (OXY IR/ROXICODONE) 5 MG immediate release tablet Take 1 tablet (5 mg total) by mouth every 6 (six) hours as needed for severe pain. 30 tablet 0   urea (CARMOL) 10 % cream Apply topically to hands and feet 2 to 3 times daily as needed 71 g 2   acetaminophen (TYLENOL) 325 MG tablet Take 2 tablets (650 mg total) by mouth every 6 (six) hours as needed for mild pain or fever.  (Patient not taking: Reported on 10/14/2022)     HYDROcodone-acetaminophen (NORCO/VICODIN) 5-325 MG tablet Take 1 tablet by mouth every 6 (six) hours as needed for severe pain. (Patient not taking: Reported on 12/30/2022) 90 tablet 0   megestrol (MEGACE) 40 MG tablet Take 10 tablets (400 mg total) by mouth daily. 150 tablet 1   No current facility-administered medications for this encounter.     REVIEW OF SYSTEMS: On review of systems, the patient reports that she is struggling with pain in her right abdomen. She has been taking Morphine ER and Oxycodone with only mild relief. This pain is constat and radiates into her back. She acknowledges poor appetite, and is going to start taking Megace. She is drinking ensure and has some nausea. No other complaints are verbalized.      PHYSICAL EXAM:  Wt Readings from Last 3 Encounters:  12/30/22 139 lb 4 oz (63.2 kg)  12/27/22 141 lb 1.6 oz (64 kg)  12/17/22 144 lb 3.2 oz (65.4 kg)   Temp Readings from Last 3 Encounters:  12/30/22 97.8 F (36.6 C) (Temporal)  12/27/22 98.7 F (37.1 C) (Oral)  12/17/22 98.2 F (36.8 C) (Oral)   BP Readings from Last 3 Encounters:  12/30/22 (!) 113/58  12/27/22 124/63  12/17/22 (!) 117/53   Pulse Readings from Last 3 Encounters:  12/30/22 (!) 110  12/27/22 (!) 102  12/17/22 85   Pain Assessment Pain Score: 10-Worst pain ever/10  In general this is a tired appearing Hispanic female in no acute distress. She's alert and oriented x4 and appropriate throughout the examination. Cardiopulmonary assessment is negative for acute distress and hs exhibits normal effort. Her abdomen is asymmetric, larger on the right side in both the upper but mostly in the lower quadrant on the right side. The tumor is palpable below the skin but does not erode through. The site measures about 15 cm in greatest dimension.     ECOG = 1  0 - Asymptomatic (Fully active, able to carry on all predisease activities without  restriction)  1 - Symptomatic but completely ambulatory (Restricted in physically strenuous activity but ambulatory and able to carry out work of a light or sedentary nature. For example, light housework, office work)  2 - Symptomatic, <50% in bed during the day (Ambulatory and capable of all self care but unable to carry out any work activities. Up and about more than 50% of waking hours)  3 - Symptomatic, >50% in bed, but not bedbound (Capable of only limited self-care, confined to bed or chair 50% or more of waking hours)  4 - Bedbound (Completely disabled. Cannot carry on any self-care. Totally confined to bed or chair)  5 - Death   Eustace Pen MM, Creech RH, Tormey DC, et al. [phone]). "Toxicity and response criteria of the [employer]". [place] Oncol.  5 (6): 649-55    LABORATORY DATA:  Lab Results  Component Value Date   WBC 7.6 12/17/2022   HGB 9.5 (L) 12/17/2022   HCT 28.0 (L) 12/17/2022   MCV 94.0 12/17/2022   PLT 492 (H) 12/17/2022   Lab Results  Component Value Date   NA 132 (L) 12/17/2022   K 4.3 12/17/2022   CL 99 12/17/2022   CO2 24 12/17/2022   Lab Results  Component Value Date   ALT 13 12/17/2022   AST 21 12/17/2022   ALKPHOS 85 12/17/2022   BILITOT 0.6 12/17/2022      RADIOGRAPHY: CT ABDOMEN PELVIS W CONTRAST  Result Date: 12/24/2022 CLINICAL DATA:  Follow-up adenocarcinoma of the gallbladder. * Tracking Code: BO * EXAM: CT ABDOMEN AND PELVIS WITH CONTRAST TECHNIQUE: Multidetector CT imaging of the abdomen and pelvis was performed using the standard protocol following bolus administration of intravenous contrast. RADIATION DOSE REDUCTION: This exam was performed according to the departmental dose-optimization program which includes automated exposure control, adjustment of the mA and/or kV according to patient size and/or use of iterative reconstruction technique. CONTRAST:  64m OMNIPAQUE IOHEXOL 300 MG/ML  SOLN COMPARISON:  09/15/2022  FINDINGS: Lower chest: Round nodular density is only seen on the first and second images localizing to the right middle lobe measuring 1.2 cm, image 1/4. Hepatobiliary: There is no focal liver abnormality. Status post cholecystectomy. No bile duct dilatation. Pancreas: Unremarkable. No pancreatic ductal dilatation or surrounding inflammatory changes. Spleen: Normal in size without focal abnormality. Adrenals/Urinary Tract: Normal adrenal glands. Nonobstructing calculi identified within the left kidney measuring up to 3 mm. Upper pole right kidney cyst measures 1.8 cm, image 16/2. No follow-up imaging recommended. Bladder appears partially decompressed. Stomach/Bowel: Stomach appears within normal limits. There is no pathologic dilatation of the large or small bowel loops. No bowel wall thickening or inflammation. Vascular/Lymphatic: No significant vascular findings are present. No enlarged abdominal or pelvic lymph nodes. Reproductive: Uterus and bilateral adnexa are unremarkable. Other: Right lower quadrant ventral abdominal wall mass has increased in size from the previous exam. This measures 10.8 x 13.1 cm, image 45/2. On the previous exam this measured approximately 5.4 by 8.6 cm. There is progressive soft tissue stranding within the right lower quadrant subcutaneous fat. Again seen is a fat containing, midline umbilical hernia. No ascites or focal fluid collections identified within the abdomen or pelvis. Musculoskeletal: There is lumbar scoliosis and multilevel lumbar spondylosis. No aggressive lytic or sclerotic bone lesions identified. IMPRESSION: 1. Interval increase in size of right lower quadrant ventral abdominal wall mass compatible with progression of metastatic disease. 2. Round nodular density is only seen on the first and second images localizing to the right middle lobe measuring 1.2 cm. This is incompletely characterized and may represent a new lung nodule. Consider more definitive  characterization with CT of the chest. 3. Nonobstructing left renal calculi. 4. Fat containing umbilical hernia. Electronically Signed   By: TKerby MoorsM.D.   On: 12/24/2022 11:58       IMPRESSION/PLAN: 1. Recurrent Progressive Adenocarcinoma of the Gallbladder. Dr. MLisbeth Renshawdiscusses the pathology findings and reviews the nature of abdominal wall metastases. Dr. MLisbeth Renshawoffers a course of palliative radiotherapy prior to her beginning a new round of chemotherapy.  We discussed the risks, benefits, short, and long term effects of radiotherapy, as well as the curative intent, and the patient is interested in proceeding. Dr. MLisbeth Renshawdiscusses the delivery and logistics of radiotherapy and anticipates a course of 2 weeks  of radiotherapy. Written consent is obtained and placed in the chart, a copy was provided to the patient. The patient will be contacted to coordinate treatment planning by our simulation department.  2. Pain secondary to #1. We encouraged the patient to try to document how often she's using oxycodone in 24 hour periods and to communicate this with Dr. Burr Medico so she can decide if her long acting medication needs to be increased. 3. RML nodule. Dr. Burr Medico plans on repeating a CT scan that is dedicated for the chest. This will be followed.   In a visit lasting 65 minutes, greater than 50% of the time was spent face to face discussing the patient's condition, in preparation for the discussion, and coordinating the patient's care with the assistance of medical interpretor services.  The above documentation reflects my direct findings during this shared patient visit. Please see the separate note by Dr. Lisbeth Renshaw on this date for the remainder of the patient's plan of care.    Carola Rhine, [facility]   **Disclaimer: This note was dictated with voice recognition software. Similar sounding words can inadvertently be transcribed and this note may contain transcription errors which may not have been  corrected upon publication of note.**

## 2023-01-04 ENCOUNTER — Encounter (HOSPITAL_COMMUNITY): Payer: Self-pay

## 2023-01-05 ENCOUNTER — Other Ambulatory Visit: Payer: Self-pay

## 2023-01-05 ENCOUNTER — Ambulatory Visit
Admission: RE | Admit: 2023-01-05 | Discharge: 2023-01-05 | Disposition: A | Discharge status: Home / Self Care | Payer: Self-pay | Source: Ambulatory Visit | Attending: Radiation Oncology | Admitting: Radiation Oncology

## 2023-01-05 LAB — RAD ONC ARIA SESSION SUMMARY
Course Elapsed Days: 0
Plan Fractions Treated to Date: 1
Plan Prescribed Dose Per Fraction: 3 Gy
Plan Total Fractions Prescribed: 10
Plan Total Prescribed Dose: 30 Gy
Reference Point Dosage Given to Date: 3 Gy
Reference Point Session Dosage Given: 3 Gy
Session Number: 1

## 2023-01-06 ENCOUNTER — Ambulatory Visit (HOSPITAL_COMMUNITY): Payer: Self-pay

## 2023-01-06 ENCOUNTER — Other Ambulatory Visit: Payer: Self-pay

## 2023-01-06 ENCOUNTER — Ambulatory Visit
Admission: RE | Admit: 2023-01-06 | Discharge: 2023-01-06 | Disposition: A | Discharge status: Home / Self Care | Payer: Self-pay | Source: Ambulatory Visit | Attending: Radiation Oncology | Admitting: Radiation Oncology

## 2023-01-06 LAB — RAD ONC ARIA SESSION SUMMARY
Course Elapsed Days: 1
Plan Fractions Treated to Date: 2
Plan Prescribed Dose Per Fraction: 3 Gy
Plan Total Fractions Prescribed: 10
Plan Total Prescribed Dose: 30 Gy
Reference Point Dosage Given to Date: 6 Gy
Reference Point Session Dosage Given: 3 Gy
Session Number: 2

## 2023-01-07 ENCOUNTER — Ambulatory Visit
Admission: RE | Admit: 2023-01-07 | Discharge: 2023-01-07 | Disposition: A | Discharge status: Home / Self Care | Payer: Self-pay | Source: Ambulatory Visit | Attending: Radiation Oncology | Admitting: Radiation Oncology

## 2023-01-07 ENCOUNTER — Other Ambulatory Visit: Payer: Self-pay

## 2023-01-07 ENCOUNTER — Ambulatory Visit (HOSPITAL_COMMUNITY)
Admission: RE | Admit: 2023-01-07 | Discharge: 2023-01-07 | Disposition: A | Discharge status: Home / Self Care | Payer: Self-pay | Source: Ambulatory Visit | Attending: Hematology | Admitting: Hematology

## 2023-01-07 ENCOUNTER — Encounter (HOSPITAL_COMMUNITY): Payer: Self-pay

## 2023-01-07 ENCOUNTER — Inpatient Hospital Stay: Payer: Self-pay

## 2023-01-07 VITALS — BP 156/81 | HR 108 | Temp 98.7°F | Resp 18 | Wt 133.8 lb

## 2023-01-07 DIAGNOSIS — C23 Malignant neoplasm of gallbladder: Secondary | ICD-10-CM | POA: Insufficient documentation

## 2023-01-07 DIAGNOSIS — Z95828 Presence of other vascular implants and grafts: Secondary | ICD-10-CM

## 2023-01-07 LAB — RAD ONC ARIA SESSION SUMMARY
Course Elapsed Days: 2
Plan Fractions Treated to Date: 3
Plan Prescribed Dose Per Fraction: 3 Gy
Plan Total Fractions Prescribed: 10
Plan Total Prescribed Dose: 30 Gy
Reference Point Dosage Given to Date: 9 Gy
Reference Point Session Dosage Given: 3 Gy
Session Number: 3

## 2023-01-07 MED ORDER — SODIUM CHLORIDE 0.9 % IV SOLN: Freq: Once | INTRAVENOUS | Status: DC

## 2023-01-07 MED ORDER — HEPARIN SOD (PORK) LOCK FLUSH 100 UNIT/ML IV SOLN
500.0000 [IU] | Freq: Once | INTRAVENOUS | Status: AC
  Administered 2023-01-07: 500 [IU]

## 2023-01-07 MED ORDER — SODIUM CHLORIDE 0.9% FLUSH
10.0000 mL | Freq: Once | INTRAVENOUS | Status: AC
  Administered 2023-01-07: 10 mL

## 2023-01-07 MED ORDER — SODIUM CHLORIDE 0.9 % IV SOLN: INTRAVENOUS | Status: AC

## 2023-01-07 NOTE — Patient Instructions (Signed)
Dehydration, Adult Dehydration is a condition in which there is not enough water or other fluids in the body. This happens when a person loses more fluids than they take in. Important organs cannot work right without the right amount of fluids. Any loss of fluids from the body can cause dehydration. Dehydration can be mild, worse, or very bad. It should be treated right away to keep it from getting very bad. What are the causes? Conditions that cause loss of water in the body. They include: Watery poop (diarrhea). Vomiting. Sweating a lot. Fever. Infection. Peeing (urinating) a lot. Not drinking enough fluids. Certain medicines, such as medicines that take extra fluid out of the body (diuretics). Lack of safe drinking water. Not being able to get enough water and food. What increases the risk? Having a long-term (chronic) illness that has not been treated the right way, such as: Diabetes. Heart disease. Kidney disease. Being 41 years of age or older. Having a disability. Living in a place that is high above the ground or sea (high in altitude). The thinner, drier air causes more fluid loss. Doing exercises that put stress on your body for a long time. Being active when in hot places. What are the signs or symptoms? Symptoms of dehydration depend on how bad it is. Mild or worse dehydration Thirst. Dry lips or dry mouth. Feeling dizzy or light-headed. Muscle cramps. Passing little pee or dark pee. Pee may be the color of tea. Headache. Very bad dehydration Changes in skin. Skin may: Be cold to the touch (clammy). Be blotchy or pale. Not go back to normal right after you pinch it and let it go. Little or no tears, pee, or sweat. Fast breathing. Low blood pressure. Weak pulse. Pulse that is more than 100 beats a minute when you are sitting still. Other changes, such as: Feeling very thirsty. Eyes that look hollow (sunken). Cold hands and feet. Being confused. Being very  tired (lethargic) or having trouble waking from sleep. Losing weight. Loss of consciousness. How is this treated? Treatment for this condition depends on how bad your dehydration is. Treatment should start right away. Do not wait until your condition gets very bad. Very bad dehydration is an emergency. You will need to go to a hospital. Mild or worse dehydration can be treated at home. You may be asked to: Drink more fluids. Drink an oral rehydration solution (ORS). This drink gives you the right amount of fluids, salts, and minerals (electrolytes). Very bad dehydration can be treated: With fluids through an IV tube. By correcting low levels of electrolytes in the body. By treating the problem that caused your dehydration. Follow these instructions at home: Oral rehydration solution If told by your doctor, drink an ORS: Make an ORS. Use instructions on the package. Start by drinking small amounts, about  cup (120 mL) every 5-10 minutes. Slowly drink more until you have had the amount that your doctor said to have.  Eating and drinking  Drink enough clear fluid to keep your pee pale yellow. If you were told to drink an ORS, finish the ORS first. Then, start slowly drinking other clear fluids. Drink fluids such as: Water. Do not drink only water. Doing that can make the salt (sodium) level in your body get too low. Water from ice chips you suck on. Fruit juice that you have added water to (diluted). Low-calorie sports drinks. Eat foods that have the right amounts of salts and minerals, such as bananas, oranges, potatoes,  tomatoes, or spinach. Do not drink alcohol. Avoid drinks that have caffeine or sugar. These include:: High-calorie sports drinks. Fruit juice that you did not add water to. Soda. Coffee or energy drinks. Avoid foods that are greasy or have a lot of fat or sugar. General instructions Take over-the-counter and prescription medicines only as told by your doctor. Do  not take sodium tablets. Doing that can make the salt level in your body get too high. Return to your normal activities as told by your doctor. Ask your doctor what activities are safe for you. Keep all follow-up visits. Your doctor may check and change your treatment. Contact a doctor if: You have pain in your belly (abdomen) and the pain: Gets worse. Stays in one place. You have a rash. You have a stiff neck. You get angry or annoyed more easily than normal. You are more tired or have a harder time waking than normal. You feel weak or dizzy. You feel very thirsty. Get help right away if: You have any symptoms of very bad dehydration. You vomit every time you eat or drink. Your vomiting gets worse, does not go away, or you vomit blood or green stuff. You are getting treatment, but symptoms are getting worse. You have a fever. You have a very bad headache. You have: Diarrhea that gets worse or does not go away. Blood in your poop (stool). This may cause poop to look black and tarry. No pee in 6-8 hours. Only a small amount of pee in 6-8 hours, and the pee is very dark. You have trouble breathing. These symptoms may be an emergency. Get help right away. Call 911. Do not wait to see if the symptoms will go away. Do not drive yourself to the hospital. This information is not intended to replace advice given to you by your health care provider. Make sure you discuss any questions you have with your health care provider. Document Revised: 05/31/2022 Document Reviewed: 05/31/2022 Elsevier Patient Education  [place].

## 2023-01-10 ENCOUNTER — Other Ambulatory Visit: Payer: Self-pay

## 2023-01-10 ENCOUNTER — Ambulatory Visit
Admission: RE | Admit: 2023-01-10 | Discharge: 2023-01-10 | Disposition: A | Discharge status: Home / Self Care | Payer: Self-pay | Source: Ambulatory Visit | Attending: Radiation Oncology | Admitting: Radiation Oncology

## 2023-01-10 LAB — RAD ONC ARIA SESSION SUMMARY
Course Elapsed Days: 5
Plan Fractions Treated to Date: 4
Plan Prescribed Dose Per Fraction: 3 Gy
Plan Total Fractions Prescribed: 10
Plan Total Prescribed Dose: 30 Gy
Reference Point Dosage Given to Date: 12 Gy
Reference Point Session Dosage Given: 3 Gy
Session Number: 4

## 2023-01-11 ENCOUNTER — Other Ambulatory Visit: Payer: Self-pay

## 2023-01-11 ENCOUNTER — Ambulatory Visit
Admission: RE | Admit: 2023-01-11 | Discharge: 2023-01-11 | Disposition: A | Discharge status: Home / Self Care | Payer: Self-pay | Source: Ambulatory Visit | Attending: Radiation Oncology | Admitting: Radiation Oncology

## 2023-01-11 LAB — RAD ONC ARIA SESSION SUMMARY
Course Elapsed Days: 6
Plan Fractions Treated to Date: 5
Plan Prescribed Dose Per Fraction: 3 Gy
Plan Total Fractions Prescribed: 10
Plan Total Prescribed Dose: 30 Gy
Reference Point Dosage Given to Date: 15 Gy
Reference Point Session Dosage Given: 3 Gy
Session Number: 5

## 2023-01-12 ENCOUNTER — Other Ambulatory Visit: Payer: Self-pay

## 2023-01-12 ENCOUNTER — Ambulatory Visit
Admission: RE | Admit: 2023-01-12 | Discharge: 2023-01-12 | Disposition: A | Discharge status: Home / Self Care | Payer: Self-pay | Source: Ambulatory Visit | Attending: Radiation Oncology | Admitting: Radiation Oncology

## 2023-01-12 LAB — RAD ONC ARIA SESSION SUMMARY
Course Elapsed Days: 7
Plan Fractions Treated to Date: 6
Plan Prescribed Dose Per Fraction: 3 Gy
Plan Total Fractions Prescribed: 10
Plan Total Prescribed Dose: 30 Gy
Reference Point Dosage Given to Date: 18 Gy
Reference Point Session Dosage Given: 3 Gy
Session Number: 6

## 2023-01-13 ENCOUNTER — Other Ambulatory Visit: Payer: Self-pay | Admitting: Hematology

## 2023-01-13 ENCOUNTER — Ambulatory Visit
Admission: RE | Admit: 2023-01-13 | Discharge: 2023-01-13 | Disposition: A | Discharge status: Home / Self Care | Payer: Self-pay | Source: Ambulatory Visit | Attending: Radiation Oncology | Admitting: Radiation Oncology

## 2023-01-13 ENCOUNTER — Other Ambulatory Visit: Payer: Self-pay

## 2023-01-13 LAB — RAD ONC ARIA SESSION SUMMARY
Course Elapsed Days: 8
Plan Fractions Treated to Date: 7
Plan Prescribed Dose Per Fraction: 3 Gy
Plan Total Fractions Prescribed: 10
Plan Total Prescribed Dose: 30 Gy
Reference Point Dosage Given to Date: 21 Gy
Reference Point Session Dosage Given: 3 Gy
Session Number: 7

## 2023-01-13 MED ORDER — OXYCODONE HCL 5 MG PO TABS: 5.0000 mg | ORAL_TABLET | Freq: Four times a day (QID) | ORAL | 0 refills | Status: DC | PRN

## 2023-01-14 ENCOUNTER — Ambulatory Visit
Admission: RE | Admit: 2023-01-14 | Discharge: 2023-01-14 | Disposition: A | Discharge status: Home / Self Care | Payer: Medicaid Other | Source: Ambulatory Visit | Attending: Radiation Oncology | Admitting: Radiation Oncology

## 2023-01-14 ENCOUNTER — Other Ambulatory Visit: Payer: Self-pay

## 2023-01-14 ENCOUNTER — Inpatient Hospital Stay: Payer: Medicaid Other | Attending: Hematology | Admitting: Hematology

## 2023-01-14 ENCOUNTER — Encounter: Payer: Self-pay | Admitting: Hematology

## 2023-01-14 ENCOUNTER — Other Ambulatory Visit: Payer: Self-pay | Admitting: Hematology

## 2023-01-14 ENCOUNTER — Inpatient Hospital Stay: Payer: Medicaid Other

## 2023-01-14 VITALS — BP 113/52 | HR 93 | Temp 98.6°F | Resp 20

## 2023-01-14 VITALS — BP 121/62 | HR 115 | Temp 97.8°F | Resp 18 | Ht 64.0 in

## 2023-01-14 DIAGNOSIS — C23 Malignant neoplasm of gallbladder: Secondary | ICD-10-CM | POA: Insufficient documentation

## 2023-01-14 DIAGNOSIS — Z510 Encounter for antineoplastic radiation therapy: Secondary | ICD-10-CM | POA: Insufficient documentation

## 2023-01-14 DIAGNOSIS — Z923 Personal history of irradiation: Secondary | ICD-10-CM | POA: Insufficient documentation

## 2023-01-14 DIAGNOSIS — C792 Secondary malignant neoplasm of skin: Secondary | ICD-10-CM | POA: Insufficient documentation

## 2023-01-14 DIAGNOSIS — R1010 Upper abdominal pain, unspecified: Secondary | ICD-10-CM

## 2023-01-14 DIAGNOSIS — D6481 Anemia due to antineoplastic chemotherapy: Secondary | ICD-10-CM | POA: Insufficient documentation

## 2023-01-14 DIAGNOSIS — E860 Dehydration: Secondary | ICD-10-CM | POA: Insufficient documentation

## 2023-01-14 DIAGNOSIS — D649 Anemia, unspecified: Secondary | ICD-10-CM

## 2023-01-14 DIAGNOSIS — G893 Neoplasm related pain (acute) (chronic): Secondary | ICD-10-CM | POA: Insufficient documentation

## 2023-01-14 DIAGNOSIS — Z95828 Presence of other vascular implants and grafts: Secondary | ICD-10-CM

## 2023-01-14 LAB — CBC WITH DIFFERENTIAL/PLATELET
Abs Immature Granulocytes: 0.2 10*3/uL — ABNORMAL HIGH (ref 0.00–0.07)
Basophils Absolute: 0.1 10*3/uL (ref 0.0–0.1)
Basophils Relative: 0 %
Eosinophils Absolute: 0 10*3/uL (ref 0.0–0.5)
Eosinophils Relative: 0 %
HCT: 24.6 % — ABNORMAL LOW (ref 36.0–46.0)
Hemoglobin: 8.1 g/dL — ABNORMAL LOW (ref 12.0–15.0)
Immature Granulocytes: 1 %
Lymphocytes Relative: 3 %
Lymphs Abs: 0.5 10*3/uL — ABNORMAL LOW (ref 0.7–4.0)
MCH: 30.6 pg (ref 26.0–34.0)
MCHC: 32.9 g/dL (ref 30.0–36.0)
MCV: 92.8 fL (ref 80.0–100.0)
Monocytes Absolute: 1 10*3/uL (ref 0.1–1.0)
Monocytes Relative: 5 %
Neutro Abs: 17.2 10*3/uL — ABNORMAL HIGH (ref 1.7–7.7)
Neutrophils Relative %: 91 %
Platelets: 263 10*3/uL (ref 150–400)
RBC: 2.65 MIL/uL — ABNORMAL LOW (ref 3.87–5.11)
RDW: 16.1 % — ABNORMAL HIGH (ref 11.5–15.5)
WBC: 19.1 10*3/uL — ABNORMAL HIGH (ref 4.0–10.5)
nRBC: 0 % (ref 0.0–0.2)

## 2023-01-14 LAB — RAD ONC ARIA SESSION SUMMARY
Course Elapsed Days: 9
Plan Fractions Treated to Date: 8
Plan Prescribed Dose Per Fraction: 3 Gy
Plan Total Fractions Prescribed: 10
Plan Total Prescribed Dose: 30 Gy
Reference Point Dosage Given to Date: 24 Gy
Reference Point Session Dosage Given: 3 Gy
Session Number: 8

## 2023-01-14 LAB — COMPREHENSIVE METABOLIC PANEL
ALT: 7 U/L (ref 0–44)
AST: 16 U/L (ref 15–41)
Albumin: 2.6 g/dL — ABNORMAL LOW (ref 3.5–5.0)
Alkaline Phosphatase: 98 U/L (ref 38–126)
Anion gap: 7 (ref 5–15)
BUN: 9 mg/dL (ref 6–20)
CO2: 26 mmol/L (ref 22–32)
Calcium: 10.3 mg/dL (ref 8.9–10.3)
Chloride: 97 mmol/L — ABNORMAL LOW (ref 98–111)
Creatinine, Ser: 0.44 mg/dL (ref 0.44–1.00)
GFR, Estimated: >60 mL/min (ref >60)
Glucose, Bld: 136 mg/dL — ABNORMAL HIGH (ref 70–99)
Potassium: 4.3 mmol/L (ref 3.5–5.1)
Sodium: 130 mmol/L — ABNORMAL LOW (ref 135–145)
Total Bilirubin: 1.3 mg/dL — ABNORMAL HIGH (ref 0.3–1.2)
Total Protein: 6.9 g/dL (ref 6.5–8.1)

## 2023-01-14 MED ORDER — NYSTATIN 100000 UNIT/ML MT SUSP
5.0000 mL | Freq: Three times a day (TID) | OROMUCOSAL | 1 refills | Status: DC | PRN
  Filled 2023-01-14: qty 140, 10d supply, fill #0

## 2023-01-14 MED ORDER — SODIUM CHLORIDE 0.9% FLUSH
10.0000 mL | Freq: Once | INTRAVENOUS | Status: AC
  Administered 2023-01-14: 10 mL

## 2023-01-14 MED ORDER — SODIUM CHLORIDE 0.9 % IV SOLN: INTRAVENOUS | Status: AC

## 2023-01-14 MED ORDER — MORPHINE SULFATE (PF) 2 MG/ML IV SOLN
2.0000 mg | Freq: Once | INTRAVENOUS | Status: AC
  Administered 2023-01-14: 2 mg via INTRAVENOUS
  Filled 2023-01-14: qty 1

## 2023-01-14 MED ORDER — HEPARIN SOD (PORK) LOCK FLUSH 100 UNIT/ML IV SOLN
500.0000 [IU] | Freq: Once | INTRAVENOUS | Status: AC
  Administered 2023-01-14: 500 [IU]

## 2023-01-14 NOTE — Patient Instructions (Addendum)
Deshidratacin en los adultos Dehydration, Adult La deshidratacin es una afeccin que se caracteriza por una cantidad insuficiente de agua u otros lquidos en el organismo. Esto sucede cuando una persona pierde ms lquido del que consume. Los [employer], como los riones, el cerebro y el corazn, no pueden funcionar sin la cantidad [place] de lquidos. Cualquier prdida de lquidos del organismo puede causar deshidratacin. La deshidratacin puede ser leve, moderada o grave. Debe tratarse de inmediato para evitar que se agrave. Cules son las causas? Las causas de la deshidratacin pueden ser las siguientes: Problemas de [place], [place], vmitos, [place], infeccin, o sudar u Education administrator. No beber la cantidad suficiente de lquidos. Determinados medicamentos, como aquellos que eliminan el exceso de lquido del cuerpo (diurticos). Falta de agua potable segura. No poder obtener suficiente agua y alimentos. Qu incrementa el riesgo? Los siguientes factores pueden hacer que sea ms propenso a Armed forces training and education officer afeccin: Tener una enfermedad prolongada (crnica) que no se ha tratado Product manager, como diabetes, enfermedad cardaca o enfermedad renal. Ser mayor de 23 aos de edad. Tener una discapacidad. Vivir en un lugar de gran altitud, donde el aire menos denso y ms seco causa ms prdida de lquidos. Hacer ejercicios que sobrecargan el cuerpo durante mucho tiempo (deportes de resistencia). Estar activo en un clima caluroso. Cules son los signos o sntomas? Los sntomas de deshidratacin dependen de su gravedad. Deshidratacin leve o moderada Sed. Sequedad en los labios o la boca. Mareos o sensacin de desvanecimiento. Calambres musculares. Orina de color oscuro. La orina puede ser color t. Menor produccin de [place] o lgrimas que lo normal. Dolor de [place]. Deshidratacin grave Cambios en la piel. La piel puede estar fra y pegajosa, con manchas o plida. Tambin es  posible que la piel no vuelva a la normalidad despus de Nutritional therapist y [place]. Escasa produccin o ausencia de lgrimas, orina o sudor. Respiracin rpida y presin arterial baja. El pulso puede ser dbil o puede tener ms de 100 latidos por minuto cuando est sentado quieto. Otros cambios, por ejemplo: Sentir mucha sed. Ojos hundidos. Manos y pies fros. Confusin. Estar muy cansado (aletargado) o tener problemas para despertarse. Prdida de peso a Control and instrumentation engineer. Prdida de la conciencia. Cmo se diagnostica? Esta afeccin se diagnostica en funcin de los sntomas y de un examen fsico. Se le pueden hacer anlisis de sangre y [place] para ayudar a confirmar el diagnstico. Cmo se trata? El tratamiento de esta afeccin depende de su gravedad. El tratamiento se debe comenzar de inmediato. No espere hasta que la deshidratacin sea grave. La deshidratacin grave es Engineer, maintenance (IT) y debe tratarse en un hospital. La deshidratacin leve o moderada puede tratarse en la casa. Le pedirn que: Beba ms lquidos. Beba una solucin de rehidratacin oral (SRO). Esta bebida restablece los lquidos, las sales y los minerales en la sangre (electrolitos). Interrumpa cualquier actividad que haya causado deshidratacin, como hacer ejercicio. Se refresque con compresas fras, bruma fra o lquidos fros, si el calor o el exceso de sudor le causaron la afeccin. Tome medicamentos para tratar la fiebre, si la fiebre le caus la afeccin. Tome medicamentos para tratar las nuseas y Building services engineer, si los vmitos o la diarrea le causaron la afeccin. La deshidratacin grave puede tratarse de la siguiente manera: Con lquidos intravenosos (i.v.). Corrigiendo los niveles anormales de electrolitos en el organismo. Tratando la causa subyacente de la deshidratacin. Siga estas instrucciones en su casa: Solucin de rehidratacin oral Si se lo indic el mdico, tome una SRO:  Para preparar una SRO, siga las  instrucciones del envase. Comience por beber pequeas cantidades, aproximadamente  taza (120 ml) cada 5 a 10 minutos. Aumente lentamente la cantidad que bebe hasta que haya ingerido la cantidad recomendada por el mdico.  Comida y bebida  Beba suficiente lquido transparente como para Theatre manager la orina de color amarillo plido. Si le indicaron que beba una SRO, termine primero la solucin y luego empiece a beber lentamente otros lquidos transparentes. Beba lquidos, por ejemplo: Agua. No beba solamente agua. Esto puede provocar hiponatremia, que es tener escasez de sal (sodio) en el organismo. Agua de trocitos de hielo que usted succiona. Jugo de frutas diluido. Esto es [place] de frutas rebajado con agua. Bebidas deportivas de bajas caloras. Consuma los alimentos que contienen un equilibrio saludable de Brewing technologist, como las bananas, las [place], las papas, los tomates y Nurse, mental health. No beba alcohol. Evite lo siguiente: Bebidas que contengan gran cantidad de azcar. Entre estas se incluyen las bebidas deportivas ricas en caloras, el jugo de frutas sin diluir y los refrescos o gaseosas. Cafena. Los alimentos con alto contenido de grasa o Location manager. Instrucciones generales Use los medicamentos de venta libre y los recetados solamente como se lo haya indicado el mdico. No tome comprimidos de sodio. Esto puede causar la acumulacin excesiva de sodio en el organismo (hipernatremia). Retome sus actividades normales como se lo haya indicado el mdico. Pregntele al mdico qu actividades son seguras para usted. Concurra a [place]. Es posible que el mdico deba controlar su evolucin y le sugiera nuevas formas de tratar su afeccin. Comunquese con un mdico si: Tiene calambres musculares, dolor o molestias, por ejemplo: Dolor en el abdomen y Conservation officer, historic buildings empeora o se mantiene en un rea. Rigidez en el cuello. Tiene una erupcin cutnea. Se siente ms irritable de lo  habitual. Est ms somnoliento o le cuesta ms despertarse. Se siente dbil o mareado. Tiene mucha sed. Solicite ayuda de inmediato si: Tiene sntomas de deshidratacin grave. Vomita cada vez que come o bebe. Los vmitos empeoran, no desaparecen o incluyen sangre o una sustancia verde (bilis). Recibe tratamiento, pero los sntomas empeoran. Tiene fiebre. Tiene un dolor de cabeza intenso. Tiene lo siguiente: Diarrea que empeora o que no desaparece. Sangre en las heces. Esto puede hacer que la materia fecal sea negra y de aspecto alquitranado. No orina u orina solamente una pequea cantidad de color muy oscuro en el trmino de 6 a 8 horas. Tiene dificultad para respirar. Estos sntomas pueden Sales executive. Solicite ayuda de inmediato. No espere a ver si los sntomas desaparecen. No conduzca por sus propios medios [employer] hospital. Llame al 911. Esta informacin no tiene Marine scientist el consejo del mdico. Asegrese de hacerle al mdico cualquier pregunta que tenga. Document Revised: 07/05/2022 Document Reviewed: 07/05/2022 Elsevier Patient Education  [place].  Rehydration, Adult Rehydration is the replacement of fluids, salts, and minerals in the body (electrolytes) that are lost during dehydration. Dehydration is when there is not enough water or other fluids in the body. This happens when you lose more fluids than you take in. Common causes of dehydration include: Not drinking enough fluids. This can occur when you are ill or doing activities that require a lot of energy, especially in hot weather. Conditions that cause loss of water or other fluids. These include diarrhea, vomiting, sweating, and urinating a lot. Other illnesses, such as fever or infection. Certain medicines, such as those that remove excess fluid  from the body (diuretics). Symptoms of mild or moderate dehydration may include thirst, dry lips and mouth, and dizziness. Symptoms of severe  dehydration may include increased heart rate, confusion, fainting, and not urinating. In severe cases, you may need to get fluids through an IV at the hospital. For mild or moderate cases, you can usually rehydrate at home by drinking certain fluids as told by your health care provider. What are the risks? Your health care provider will talk with you about risks. Your health care provider will talk with you about risks. This may include taking in too much fluid (overhydration). This is rare. Overhydration can cause an imbalance of electrolytes in the body, kidney failure, or a decrease in salt (sodium) levels in the body. Supplies needed: You will need an oral rehydration solution (ORS) if your health care provider tells you to use one. This is a drink to treat dehydration. It can be found in pharmacies and retail stores. How to rehydrate Fluids Follow instructions from your health care provider about what to drink. The kind of fluid and the amount you should drink depend on your condition. In general, you should choose drinks that you prefer. If told by your health care provider, drink an ORS. Make an ORS by following instructions on the package. Start by drinking small amounts, about  cup (120 mL) every 5-10 minutes. Slowly increase how much you drink until you have taken in the amount recommended by your health care provider. Drink enough clear fluids to keep your urine pale yellow. If you were told to drink an ORS, finish it first, then start slowly drinking other clear fluids. Drink fluids such as: Water. This includes sparkling and flavored water. Drinking only water can lead to having too little sodium in your body (hyponatremia). Follow the advice of your health care provider. Water from ice chips you suck on. Fruit juice with water added to it (diluted). Sports drinks. Hot or cold herbal teas. Broth-based soups. Milk or milk products. Food Follow instructions from your health care  provider about what to eat while you rehydrate. Your health care provider may recommend that you slowly begin eating regular foods in small amounts. Eat foods that contain a healthy balance of electrolytes, such as bananas, oranges, potatoes, tomatoes, and spinach. Avoid foods that are greasy or contain a lot of sugar. In some cases, you may get nutrition through a feeding tube that is passed through your nose and into your stomach (nasogastric tube, or NG tube). This may be done if you have uncontrolled vomiting or diarrhea. Drinks to avoid  Certain drinks may make dehydration worse. While you rehydrate, avoid drinking alcohol. How to tell if you are recovering from dehydration You may be getting better if: You are urinating more often than before you started rehydrating. Your urine is pale yellow. Your energy level improves. You vomit less often. You have diarrhea less often. Your appetite improves or returns to normal. You feel less dizzy or light-headed. Your skin tone and color start to look more normal. Follow these instructions at home: Take over-the-counter and prescription medicines only as told by your health care provider. Do not take sodium tablets. Doing this can lead to having too much sodium in your body (hypernatremia). Contact a health care provider if: You continue to have symptoms of mild or moderate dehydration, such as: Thirst. Dry lips. Slightly dry mouth. Dizziness. Dark urine or less urine than normal. Muscle cramps. You continue to vomit or have diarrhea. Get  help right away if: You have symptoms of dehydration that get worse. You have a fever. You have a severe headache. You have been vomiting and have problems, such as: Your vomiting gets worse or does not go away. Your vomit includes blood or green matter (bile). You cannot eat or drink without vomiting. You have problems with urination or bowel movements, such as: Diarrhea that gets worse or does not  go away. Blood in your stool (feces). This may cause stool to look black and tarry. Not urinating, or urinating only a small amount of very dark urine, within 6-8 hours. You have trouble breathing. You have symptoms that get worse with treatment. These symptoms may be an emergency. Get help right away. Call 911. Do not wait to see if the symptoms will go away. Do not drive yourself to the hospital. This information is not intended to replace advice given to you by your health care provider. Make sure you discuss any questions you have with your health care provider. Document Revised: 03/15/2022 Document Reviewed: 03/15/2022 Elsevier Patient Education  [place].

## 2023-01-14 NOTE — Progress Notes (Signed)
Verbal order given for Magic Mouthwash w/Lidocaine 138m with 1 refill sent to [facility]

## 2023-01-14 NOTE — Assessment & Plan Note (Signed)
diagnosed in 07/2021, s/p surgical resection  -she received adjuvant chemotherapy with Xeloda for 6 months.  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  CT scan also showed a new right lower lobe lung nodule, I will obtain a CT chest. -Due to her significant pain from her abdominal wall metastasis, I will ask radiation oncology to see if palliative radiation is feasible. -I have recommended changing chemotherapy to FOLFOX.  Potential benefit and side effects were discussed with patient, she agrees to proceed, will start after she completes radiation. -I have ordered NGS Tempus, result is still pending

## 2023-01-14 NOTE — Addendum Note (Signed)
Addended by: Truitt Merle on: 01/14/2023 03:52 PM   Modules accepted: Orders

## 2023-01-14 NOTE — Assessment & Plan Note (Signed)
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

## 2023-01-14 NOTE — Progress Notes (Signed)
[place]   [phone] [phone]   Clinic Follow up Note   Patient Care Team: Geradine Girt, DO as PCP - General (Internal Medicine) Dwan Bolt, MD as Consulting Physician (General Surgery) Truitt Merle, MD as Consulting Physician (Hematology) [place] as Consulting Physician (Radiology)  Date of Service:  01/14/2023  CHIEF COMPLAINT: f/u of recurrent gallbladder cancer    CURRENT THERAPY:  First line gemcitabine/cisplatin on days 1 and 8 q21 days and durvalumab q21 days; starting 10/19/22       ASSESSMENT:  Michele Arnold is a 62 y.o. 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  CT scan also showed a new right lower lobe lung nodule, I will obtain a CT chest. -Due to her significant pain from her abdominal wall metastasis, I will ask radiation oncology to see if palliative radiation is feasible. -I have recommended changing chemotherapy to FOLFOX.  Potential benefit and side effects were discussed with patient, she agrees to proceed, will start after she completes radiation. -I have ordered NGS [place]us, result is still pending  -She has been very fatigued, not eating much since she started radiation last week.  She appears to be lethargic, tachycardic during her office visit today.  Blood pressure is normal.  I  will repeat her labs, and give her IV fluids today.  Abdominal pain -secondary to cancer recurrence at abdominal wall  -on Norco as needed, I will change to oxycodone 5 mg as needed -she is on MS contin '15mg'$  q12h, pain is overall controlled.    PLAN: -lab draw today -prescribed Magic Mouth wash -encourage the pt to Drink more fluids and ensure  -IV Hydration 2h today and gain on  01/17/2023 -lab and f/u in one week   SUMMARY OF ONCOLOGIC HISTORY: Oncology History Overview Note   Cancer Staging  Adenocarcinoma of gallbladder [facility]) Staging form: Gallbladder, AJCC 8th Edition - Clinical stage from 09/04/2021: Stage IIA (cT2a, cN0, cM0) - Signed by Alla Feeling, NP on 09/21/2021 Stage prefix: Initial diagnosis Total positive nodes: 0 Histologic grade (G): G3 Histologic grading system: 3 grade system Histologic sub-type: Adenocarcinoma     Adenocarcinoma of gallbladder ([place])  07/21/2021 Imaging   CT AP IMPRESSION: 1. Cholelithiasis with distended gallbladder. No CT evidence of acute cholecystitis. Consider further evaluation with right upper quadrant ultrasound as clinically indicated. 2. Punctate nonobstructive left nephrolithiasis. 3. Aortic Atherosclerosis (ICD10-I70.0).   07/21/2021 Imaging   ABD [place] RUQ IMPRESSION: 1. Cholelithiasis without evidence of acute cholecystitis. 2. Mild hepatic steatosis.     07/21/2021 Imaging   MRA/MRCP IMPRESSION: 1. No biliary ductal dilation. No choledocholithiasis. 2. Mild hepatic steatosis. 3. Cholelithiasis with dilation of the gallbladder and trace pericholecystic fluid. No gallbladder wall thickening or abnormal wall enhancement. Findings which are equivocal for acute cholecystitis. Consider further evaluation with nuclear medicine HIDA scan to assess cystic duct patency if clinically indicated.   07/22/2021 Imaging   HIDA SCAN IMPRESSION: Scintigraphic  findings most consistent with acute cholecystitis.   07/23/2021 Surgery   Preop Dx:         Acute cholecystitis Postop Dx:      Acute cholecystitis with 1 cm stone impacted in the infundibulum Procedure:      Xi robotic cholecystectomy with ICG By Dr. Johnathan Hausen   07/23/2021 Pathology Results   FINAL MICROSCOPIC DIAGNOSIS:  A. GALLBLADDER, CHOLECYSTECTOMY:  - Invasive poorly differentiated adenocarcinoma, 4.3 cm, involving  gallbladder neck  - Carcinoma invades perimuscular soft tissue  - Cystic duct margin is focally positive for carcinoma  - Focally suspicious for lymphovascular invasion   Procedure: Cholecystectomy  Tumor Site: Gallbladder neck  Tumor Size: 4.3 cm  Histologic Type: Adenocarcinoma  Histologic Grade: G3: Poorly differentiated  Tumor Extension: Tumor invades perimuscular connective tissue on the peritoneal side without serosal involvement  Margins:       Margin Status for Invasive Carcinoma: Cystic duct margin is positive for carcinoma  Regional Lymph Nodes: Not applicable (no lymph nodes submitted or found)  Distant Metastasis:       Distant Site(s) Involved: Not applicable  Pathologic Stage Classification (pTNM, AJCC 8th Edition): pT2a, pN not assigned    08/20/2021 Imaging   CT chest IMPRESSION: Negative. No CT evidence for acute intrathoracic abnormality. Negative for pulmonary nodule or evidence for metastatic disease to the chest.   09/01/2021 Tumor Marker   CA 19-9: 8 (normal)   09/04/2021 Initial Diagnosis   Adenocarcinoma of gallbladder ([place])   09/04/2021 Definitive Surgery   Procedure: by Dr. Michaelle Birks Staging laparoscopy Exploratory laparotomy with intraoperative cholangiogram Excision of new cystic duct margin Partial central hepatectomy (resection of gallbladder fossa - segments 4b and 5) Portal lymph node dissection   09/04/2021 Pathology Results   FINAL MICROSCOPIC DIAGNOSIS:  A. CYSTIC DUCT, NEW MARGIN, EXCISION:  -  No adenocarcinoma identified  B. LYMPH NODE, PORTAL, EXCISION:  -  Nodular fat necrosis and  fibrosis  -  No nodal tissue identified  C. GALLBLADDER, FOSSA, EXCISION:  -  Benign liver with periductular chronic inflammation and  macrovesicular steatosis  -  No adenocarcinoma identified  D. LYMPH NODE, STATION EIGHT, EXCISION:  -  No adenocarcinoma identified in one lymph node (0/1)    09/04/2021 Cancer Staging   Staging form: Gallbladder, AJCC 8th Edition - Clinical stage from 09/04/2021: Stage IIA (cT2a, cN0, cM0) - Signed by Alla Feeling, NP on 09/21/2021 Stage prefix: Initial diagnosis Total positive nodes: 0 Histologic grade (G): G3 Histologic grading system: 3 grade system Histologic sub-type: Adenocarcinoma   09/28/2021 - 09/28/2021 Chemotherapy   Patient is on Treatment Plan : BREAST Capecitabine q21d     09/30/2022 Relapse/Recurrence   FINAL MICROSCOPIC DIAGNOSIS:  A. 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  It is composed of solid sheets and nests of atypical cells with variably enlarged round to oval irregular nuclei and a variable amount of eosinophilic focally vacuolated cytoplasm, and these sheets and nests are admixed with infiltrating irregular angulated glands lined by an atypical columnar epithelium composed of similar-appearing cells.  Glandular lumens contain mucin.  Focally these epithelia are contiguous and are present within the same sheet and/or gland. Five immunohistochemical stains performed with adequate control. The tumor is positive for cytokeratin 7 and the solid sheets and nests are positive for the squamous marker p40.  The tumor is  negative for cytokeratin 20.  The tumor is also negative for the GI marker CDX2.  The tumor is negative for the hepatic marker arginase.  The prior cholecystectomy specimen [id number]) is reviewed and this  carcinoma shows identical cytohistomorphology as the previously diagnosed gallbladder adenocarcinoma.    10/01/2022 PET scan   IMPRESSION: 1. Progressive soft tissue mass involving the right lower quadrant ventral abdominal wall is intensely FDG avid compatible with recurrent tumor. 2. No signs of FDG avid nodal or solid organ metastasis. 3. Multiple fat containing ventral abdominal wall hernias. 4.  Aortic Atherosclerosis (ICD10-I70.0).   10/12/2022 - 10/12/2022 Chemotherapy   Patient is on Treatment Plan : BILIARY TRACT Cisplatin + Gemcitabine D1,8 q21d     10/12/2022 - 10/12/2022 Chemotherapy   Patient is on Treatment Plan : BLADDER Durvalumab (10) q14d     10/19/2022 - 12/02/2022 Chemotherapy   Patient is on Treatment Plan : BILIARY TRACT Cisplatin + Gemcitabine D1,8 q21d     10/19/2022 - 12/02/2022 Chemotherapy   Patient is on Treatment Plan : BLADDER Durvalumab (10) q14d     12/23/2022 Imaging    IMPRESSION: 1. Interval increase in size of right lower quadrant ventral abdominal wall mass compatible with progression of metastatic disease. 2. Round nodular density is only seen on the first and second images localizing to the right middle lobe measuring 1.2 cm. This is incompletely characterized and may represent a new lung nodule. Consider more definitive characterization with CT of the chest. 3. Nonobstructing left renal calculi. 4. Fat containing umbilical hernia.   01/03/2023 -  Chemotherapy   Patient is on Treatment Plan : COLORECTAL FOLFOX q14d x 6 months     01/07/2023 Imaging    IMPRESSION: 1. Examination is somewhat limited by breath motion artifact throughout. 2. Spiculated appearing fissural nodule of the posterolateral segment right middle lobe abutting the minor fissure measuring 1.0 x 1.0 cm corresponding to finding of prior CT abdomen pelvis. This is new compared to most recent prior imaging of the chest and prior PET-CT, and is worrisome for a pulmonary  metastasis, or perhaps alternately primary lung malignancy. 3. No evidence of lymph      INTERVAL HISTORY:  Michele Arnold is here for a follow up of recurrent gallbladder cancer   She was last seen by me on 12/27/2021 She presents to the clinic family  and interpreter. Pt state she is very tired every day. Pt reports that she doesn't feel good after radiation. Pt states the pain is the same after radiation . She reports that she takes the Morphine in the morning and night. Pt takes oxycodone one in the morning and one at night. Pt doesn't do much at home she always in bed. Pt thinks its the radiation that makes her tired and dizzy and she see blurry and starts.Pt appetite is not good she only drinks one ensure the whole day. Pt states her tongue hurts.      All other systems were reviewed with the patient and are negative.  MEDICAL HISTORY:  Past Medical History:  Diagnosis Date   GB CA    Hypertension     SURGICAL HISTORY: Past Surgical History:  Procedure Laterality Date   CHOLECYSTECTOMY  07/23/2021   INTRAOPERATIVE CHOLANGIOGRAM N/A 09/04/2021   Procedure: INTRAOPERATIVE CHOLANGIOGRAM;  Surgeon: Dwan Bolt, MD;  Location: [place];  Service: General;  Laterality: N/A;   LAPAROSCOPY N/A 09/04/2021   Procedure: STAGING LAPAROSCOPY;  Surgeon: Dwan Bolt, MD;  Location: [place];  Service: General;  Laterality: N/A;   LYMPH NODE DISSECTION N/A 09/04/2021   Procedure: PORTAL LYMPH NODE DISSECTION;  Surgeon: Dwan Bolt, MD;  Location: [place];  Service: General;  Laterality: N/A;   OPEN HEPATECTOMY  N/A 09/04/2021   Procedure: OPEN PARTIAL CENTRAL HEPATECTOMY;  Surgeon: Dwan Bolt, MD;  Location: [place];  Service: General;  Laterality: N/A;   PORTACATH PLACEMENT Right 10/15/2022   Procedure: INSERTION PORT-A-CATH;  Surgeon: Dwan Bolt, MD;  Location: [place];  Service: General;  Laterality: Right;    I have reviewed the social history and family history with the  patient and they are unchanged from previous note.  ALLERGIES:  has No Known Allergies.  MEDICATIONS:  Current Outpatient Medications  Medication Sig Dispense Refill   acetaminophen (TYLENOL) 325 MG tablet Take 2 tablets (650 mg total) by mouth every 6 (six) hours as needed for mild pain or fever. (Patient not taking: Reported on 10/14/2022)     atorvastatin (LIPITOR) 40 MG tablet Take 40 mg by mouth daily.     dexamethasone (DECADRON) 4 MG tablet Take 1 tablet (4 mg total) by mouth 2 (two) times daily with a meal. 30 tablet 0   hydrochlorothiazide (MICROZIDE) 12.5 MG capsule Take 12.5 mg by mouth daily.     HYDROcodone-acetaminophen (NORCO/VICODIN) 5-325 MG tablet Take 1 tablet by mouth every 6 (six) hours as needed for severe pain. (Patient not taking: Reported on 12/30/2022) 90 tablet 0   lisinopril (ZESTRIL) 10 MG tablet Take 1 tablet (10 mg total) by mouth daily. 30 tablet 0   megestrol (MEGACE) 40 MG tablet Take 10 tablets (400 mg total) by mouth daily. 150 tablet 1   morphine (MS CONTIN) 15 MG 12 hr tablet Take 1 tablet (15 mg total) by mouth every 12 (twelve) hours. 60 tablet 0   nystatin (MYCOSTATIN) 100000 UNIT/ML suspension Take 5 mLs by mouth 4 times daily. Swish and swallow. 60 mL 0   oxyCODONE (OXY IR/ROXICODONE) 5 MG immediate release tablet Take 1 tablet (5 mg total) by mouth every 6 (six) hours as needed for severe pain. 60 tablet 0   urea (CARMOL) 10 % cream Apply topically to hands and feet 2 to 3 times daily as needed 71 g 2   No current facility-administered medications for this visit.    PHYSICAL EXAMINATION: ECOG PERFORMANCE STATUS: 3 - Symptomatic, >50% confined to bed  Vitals:   01/14/23 1419  BP: 121/62  Pulse: (!) 115  Resp: 18  Temp: 97.8 F (36.6 C)  SpO2: 100%   Wt Readings from Last 3 Encounters:  01/07/23 133 lb 12.8 oz (60.7 kg)  12/30/22 139 lb 4 oz (63.2 kg)  12/27/22 141 lb 1.6 oz (64 kg)     LUNGS: (-)clear to auscultation and percussion  with normal breathing effort HEART: (-) regular rate & rhythm and no murmurs and no lower extremity edema ABDOMEN:abdomen soft, (+) tender and normal bowel sounds   LABORATORY DATA:  I have reviewed the data as listed    Latest Ref Rng & Units 12/17/2022    9:21 AM 12/02/2022    8:39 AM 11/22/2022    8:44 AM  CBC  WBC 4.0 - 10.5 K/uL 7.6  10.1  14.2   Hemoglobin 12.0 - 15.0 g/dL 9.5  10.5  11.7   Hematocrit 36.0 - 46.0 % 28.0  29.8  34.3   Platelets 150 - 400 K/uL 492  184  209  Latest Ref Rng & Units 12/17/2022    8:51 AM 12/02/2022    8:39 AM 11/22/2022    8:44 AM  CMP  Glucose 70 - 99 mg/dL 129  129  98   BUN 6 - 20 mg/dL '9  7  11   '$ Creatinine 0.44 - 1.00 mg/dL 0.48  0.55  0.62   Sodium 135 - 145 mmol/L 132  128  133   Potassium 3.5 - 5.1 mmol/L 4.3  3.6  3.3   Chloride 98 - 111 mmol/L 99  98  99   CO2 22 - 32 mmol/L '24  21  25   '$ Calcium 8.9 - 10.3 mg/dL 9.1  8.5  9.1   Total Protein 6.5 - 8.1 g/dL 6.6  6.1  6.2   Total Bilirubin 0.3 - 1.2 mg/dL 0.6  0.7  0.5   Alkaline Phos 38 - 126 U/L 85  94  96   AST 15 - 41 U/L '21  18  22   '$ ALT 0 - 44 U/L '13  18  27       '$ RADIOGRAPHIC STUDIES: I have personally reviewed the radiological images as listed and agreed with the findings in the report. No results found.    No orders of the defined types were placed in this encounter.  All questions were answered. The patient knows to call the clinic with any problems, questions or concerns. No barriers to learning was detected. The total time spent in the appointment was 30 minutes.     Truitt Merle, MD 01/14/2023   Felicity Coyer, CMA, am acting as scribe for Truitt Merle, MD.   I have reviewed the above documentation for accuracy and completeness, and I agree with the above.

## 2023-01-15 ENCOUNTER — Other Ambulatory Visit (HOSPITAL_COMMUNITY): Payer: Self-pay

## 2023-01-15 ENCOUNTER — Encounter: Payer: Self-pay | Admitting: Hematology

## 2023-01-17 ENCOUNTER — Other Ambulatory Visit (HOSPITAL_COMMUNITY): Payer: Self-pay

## 2023-01-17 ENCOUNTER — Other Ambulatory Visit: Payer: Self-pay

## 2023-01-17 ENCOUNTER — Inpatient Hospital Stay: Payer: Medicaid Other

## 2023-01-17 ENCOUNTER — Ambulatory Visit
Admission: RE | Admit: 2023-01-17 | Discharge: 2023-01-17 | Disposition: A | Discharge status: Home / Self Care | Payer: Medicaid Other | Source: Ambulatory Visit | Attending: Radiation Oncology | Admitting: Radiation Oncology

## 2023-01-17 VITALS — BP 115/56 | HR 95 | Temp 98.4°F | Resp 17

## 2023-01-17 DIAGNOSIS — G893 Neoplasm related pain (acute) (chronic): Secondary | ICD-10-CM | POA: Diagnosis not present

## 2023-01-17 DIAGNOSIS — Z95828 Presence of other vascular implants and grafts: Secondary | ICD-10-CM

## 2023-01-17 DIAGNOSIS — C23 Malignant neoplasm of gallbladder: Secondary | ICD-10-CM

## 2023-01-17 LAB — RAD ONC ARIA SESSION SUMMARY
Course Elapsed Days: 12
Plan Fractions Treated to Date: 9
Plan Prescribed Dose Per Fraction: 3 Gy
Plan Total Fractions Prescribed: 10
Plan Total Prescribed Dose: 30 Gy
Reference Point Dosage Given to Date: 27 Gy
Reference Point Session Dosage Given: 3 Gy
Session Number: 9

## 2023-01-17 MED ORDER — SODIUM CHLORIDE 0.9% FLUSH
10.0000 mL | Freq: Once | INTRAVENOUS | Status: AC
  Administered 2023-01-17: 10 mL

## 2023-01-17 MED ORDER — HEPARIN SOD (PORK) LOCK FLUSH 100 UNIT/ML IV SOLN
500.0000 [IU] | Freq: Once | INTRAVENOUS | Status: AC
  Administered 2023-01-17: 500 [IU]

## 2023-01-17 MED ORDER — SODIUM CHLORIDE 0.9 % IV SOLN: INTRAVENOUS | Status: AC

## 2023-01-17 NOTE — Patient Instructions (Signed)
Deshidratacin en los adultos Dehydration, Adult La deshidratacin es una afeccin que se caracteriza por una cantidad insuficiente de agua u otros lquidos en el organismo. Esto sucede cuando una persona pierde ms lquido del que consume. Los rganos importantes no pueden funcionar correctamente sin una cantidad [place] de lquidos. Cualquier prdida de lquidos del organismo puede causar deshidratacin. La deshidratacin puede ser leve, grave o muy grave. Debe tratarse de inmediato para evitar que sea muy grave. Cules son las causas? Afecciones que causan prdida de agua en el cuerpo. Entre ellas, se incluyen las siguientes: Materia fecal lquida (diarrea). Vmitos. Sudoracin abundante. Cristy Hilts. Infeccin. Hacer mucho pis (orinar). No beber la cantidad suficiente de lquidos. Determinados medicamentos, como aquellos que eliminan el exceso de lquido del organismo (diurticos). Falta de agua potable segura. No poder obtener suficiente agua y alimentos. Qu incrementa el riesgo? Tener una enfermedad prolongada (crnica) que no se ha tratado de Estate agent, como: Diabetes. Enfermedad cardaca. Enfermedad renal. Ser mayor de 20 aos de edad. Tener una discapacidad. Vivir en un lugar alto con respecto al suelo o al nivel del mar (de gran altitud). El aire menos denso y ms seco causa ms prdida de lquidos. Hacer ejercicios que sobrecargan el cuerpo [employer]. Estar activo cuando se encuentra en lugares calurosos. Cules son los signos o sntomas? Los sntomas de deshidratacin dependen de su gravedad. Deshidratacin leve o grave Sed. Sequedad en los labios o la boca. Sentirse mareado o aturdido. Calambres musculares. Producir muy poca cantidad de [place], o de color oscuro. Orina que puede tener el color del t. Dolor de [place]. Deshidratacin muy grave Cambios en la piel. La piel puede: Estar fra al tacto (pegajosa). Tener manchas o estar plida. No  volver a la normalidad de inmediato despus de pellizcarla y soltarla. Escasa produccin o ausencia de lgrimas, orina o sudor. Respiracin acelerada. Presin arterial baja. Pulso dbil. Pulso que supera los 100 latidos por minuto cuando est sentado y [place]. Otros cambios, por ejemplo: Sentir mucha sed. Ojos que se ven huecos (hundidos). Manos y pies fros. Confusin. Estar muy cansado (aletargado) o tener problemas para despertarse. Bajar de [place]. Prdida de la conciencia. Cmo se trata? El tratamiento de esta afeccin depende de la gravedad de la deshidratacin. El tratamiento debe comenzar de inmediato. No espere hasta que su afeccin sea muy grave. La deshidratacin muy grave es Engineer, maintenance (IT). Tendr que ir al hospital. La deshidratacin leve o grave puede tratarse en la casa. Le pedirn que: Beba ms lquidos. Beba una solucin de rehidratacin oral (SRO). Esta bebida le proporciona la cantidad correcta de lquidos, sales y Optometrist (electrolitos). La deshidratacin muy grave puede tratarse: Con lquidos a travs de [employer]. Corrigiendo los niveles bajos de electrolitos en el cuerpo. Tratando el problema que provoc la deshidratacin. Siga estas instrucciones en su casa: Solucin de rehidratacin oral Si se lo indica el mdico, beba una SRO: Prepare una SRO. Use las instrucciones del envase. Comience por beber pequeas cantidades, aproximadamente  taza (120 ml) cada 5 a 10 minutos. Bonnita Nasuti de a Firefighter a la cantidad indicada por el mdico.  Comida y bebida  Beba suficiente lquido transparente como para mantener la orina de color amarillo plido. Si le indicaron que tome una SRO, termine primero la solucin. Luego, comience a beber lentamente otros lquidos transparentes. Beba lquidos, por ejemplo: Agua. No beba solamente agua. Esto puede [employer] niveles de sal (sodio) en el organismo bajen demasiado. Agua de trocitos de [employer]  usted  succiona. Jugo de frutas con agregado de agua (diluido). Bebidas deportivas de bajas caloras. Coma alimentos que contengan cantidades adecuadas de sales y [place], como bananas, [place], papas, tomates o espinaca. No beba alcohol. Evite las bebidas que contengan cafena o azcar. Estas incluyen las siguientes: Bebidas deportivas ricas en caloras. Jugo de frutas sin agregado de agua. Gaseosas. Caf o bebidas energticas. Evite los alimentos grasos o que contienen mucha grasa o Location manager. Instrucciones generales [employer] de venta libre y los recetados solamente como se lo haya indicado el mdico. No tome comprimidos de [employer]. Esto puede hacer que los niveles de sal en el cuerpo suban demasiado. Retome sus actividades normales segn lo indicado por el mdico. Pregntele al mdico qu actividades son seguras para usted. Concurra a [place]. Es posible que el mdico controle y [place]. Comunquese con un mdico si: Tiene dolor de vientre (abdomen) y el dolor: Empeora. Permanece en un solo Environmental consultant. Tiene una erupcin cutnea. Presenta rigidez en el cuello. Se siente enojado o molesto con ms facilidad de lo normal. Est ms cansado o le cuesta despertarse ms de lo normal. Se siente dbil o mareado. Tiene mucha sed. Solicite ayuda de inmediato si: Tiene sntomas de una deshidratacin muy grave. Vomita cada vez que come o bebe. Los vmitos empeoran, no desaparecen o vomita sangre o una sustancia verde. Recibe tratamiento, pero los sntomas empeoran. Tiene fiebre. Tiene un dolor de cabeza muy intenso. Tiene lo siguiente: Diarrea que empeora o que no desaparece. Sangre en las heces (materia fecal). Esto puede hacer que la materia fecal sea negra y tenga aspecto alquitranado. No orina en un lapso de 6 a 8 horas. Solo produce una cantidad pequea de orina en el trmino de 6 a 8 horas, y la orina es [place]. Tiene dificultad para  respirar. Estos sntomas pueden Sales executive. Solicite ayuda de inmediato. Llame al 911. No espere a ver si los sntomas desaparecen. No conduzca por sus propios medios [employer]. Esta informacin no tiene Marine scientist el consejo del mdico. Asegrese de hacerle al mdico cualquier pregunta que tenga. Document Revised: 07/05/2022 Document Reviewed: 07/05/2022 Elsevier Patient Education  [place].

## 2023-01-18 ENCOUNTER — Ambulatory Visit
Admission: RE | Admit: 2023-01-18 | Discharge: 2023-01-18 | Disposition: A | Discharge status: Home / Self Care | Payer: Medicaid Other | Source: Ambulatory Visit | Attending: Radiation Oncology | Admitting: Radiation Oncology

## 2023-01-18 ENCOUNTER — Other Ambulatory Visit: Payer: Self-pay

## 2023-01-18 ENCOUNTER — Encounter: Payer: Self-pay | Admitting: Radiation Oncology

## 2023-01-18 DIAGNOSIS — G893 Neoplasm related pain (acute) (chronic): Secondary | ICD-10-CM | POA: Diagnosis not present

## 2023-01-18 LAB — RAD ONC ARIA SESSION SUMMARY
Course Elapsed Days: 13
Plan Fractions Treated to Date: 10
Plan Prescribed Dose Per Fraction: 3 Gy
Plan Total Fractions Prescribed: 10
Plan Total Prescribed Dose: 30 Gy
Reference Point Dosage Given to Date: 30 Gy
Reference Point Session Dosage Given: 3 Gy
Session Number: 10

## 2023-01-18 NOTE — Progress Notes (Signed)
                                                                                                                                                            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 She did continue to have some pain during her treatment in the area she had pain in prior to radiation. She also underwent a CT of the chest 01/07/23 showed a 1 cm nodule in the RML abutting the minor fissure concerning for metastatic disease.   Plan: The patient will receive a call in about one month from the radiation oncology department. She will continue follow up with Dr. Mosetta Putt as well; at this point we would not anticipate a role for radiation to the chest but could consider this depending on her response to treatment overall.   ________________________________________________    Osker Mason, PAC

## 2023-01-19 ENCOUNTER — Other Ambulatory Visit: Payer: Self-pay

## 2023-01-19 ENCOUNTER — Other Ambulatory Visit (HOSPITAL_COMMUNITY): Payer: Self-pay

## 2023-01-19 NOTE — Progress Notes (Signed)
[facility]   [phone] [phone]   Clinic Follow up Note   Patient Care Team: Joseph Art, DO as PCP - General (Internal Medicine) Fritzi Mandes, MD as Consulting Physician (General Surgery) Malachy Mood, MD as Consulting Physician (Hematology) Diagnostic Radiology & Imaging, [facility] as Consulting Physician (Radiology)  Date of Service:  01/20/2023  CHIEF COMPLAINT: f/u of recurrent gallbladder cancer     CURRENT THERAPY:  First line gemcitabine/cisplatin on days 1 and 8 q21 days and durvalumab q21 days; starting 10/19/22       ASSESSMENT:  Michele Arnold is a 62 y.o. 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 -she started neoadjuvant chemo cisplatin, gemcitabine and durvalumab on 10/19/22, she initially tolerated well but developed more side effects later on, stopped due to cancer progression  -Her recent restaging CT showed cancer progression and a new right lung nodule, I reviewed with her  -Due to her significant pain from her abdominal wall metastasis, she recently finished palliative RT -FO showed low mutation burden, PIK3CA mutation(+), no other targetable mutations  -her overall PS has dropped significantly since RT, not adequate for chemo, will give IVF and blood transfusion today   Abdominal pain -secondary to cancer recurrence at abdominal wall  -on Norco as needed, I will change to oxycodone 5 mg as needed -she is on MS contin 15mg  q12h, pain is overall controlled.     PLAN: - Discuss CT chest scan findings  -IV hydration and  unit of blood today, no  chemo -lab, f/u and first cycle chemo FOLFOX on  01/31/2023, if her PS remains to be 3, will discuss hospice with her on next visit     SUMMARY OF ONCOLOGIC HISTORY: Oncology History Overview Note   Cancer Staging  Adenocarcinoma of gallbladder [facility]) Staging form: Gallbladder, AJCC 8th Edition - Clinical stage from 09/04/2021: Stage IIA (cT2a, cN0, cM0) - Signed by Pollyann Samples, NP on 09/21/2021 Stage prefix: Initial diagnosis Total positive nodes: 0 Histologic grade (G): G3 Histologic grading system: 3 grade system Histologic sub-type: Adenocarcinoma     Adenocarcinoma of gallbladder (HCC)  07/21/2021 Imaging   CT AP IMPRESSION: 1. Cholelithiasis with distended gallbladder. No CT evidence of acute cholecystitis. Consider further evaluation with right upper quadrant ultrasound as clinically indicated. 2. Punctate nonobstructive left nephrolithiasis. 3. Aortic Atherosclerosis (ICD10-I70.0).   07/21/2021 Imaging   ABD [place] RUQ IMPRESSION: 1. Cholelithiasis without evidence of acute cholecystitis. 2. Mild hepatic steatosis.     07/21/2021 Imaging   MRA/MRCP IMPRESSION: 1. No biliary ductal dilation. No choledocholithiasis. 2. Mild hepatic steatosis. 3. Cholelithiasis with dilation of the gallbladder and trace pericholecystic fluid. No gallbladder wall thickening or abnormal wall enhancement. Findings which are equivocal for acute cholecystitis. Consider further evaluation with nuclear medicine HIDA scan to assess cystic duct patency if clinically indicated.   07/22/2021 Imaging   HIDA SCAN IMPRESSION: Scintigraphic findings most consistent with acute cholecystitis.   07/23/2021 Surgery   Preop Dx:        Acute cholecystitis Postop Dx:      Acute cholecystitis with 1 cm stone impacted in the infundibulum Procedure:      [employer]  robotic cholecystectomy with ICG By Dr. Luretha Murphy   07/23/2021 Pathology Results   FINAL MICROSCOPIC DIAGNOSIS:  A. GALLBLADDER, CHOLECYSTECTOMY:  - Invasive  poorly differentiated adenocarcinoma, 4.3 cm, involving  gallbladder neck  - Carcinoma invades perimuscular soft tissue  - Cystic duct margin is focally positive for carcinoma  - Focally suspicious for lymphovascular invasion   Procedure: Cholecystectomy  Tumor Site: Gallbladder neck  Tumor Size: 4.3 cm  Histologic Type: Adenocarcinoma  Histologic Grade: G3: Poorly differentiated  Tumor Extension: Tumor invades perimuscular connective tissue on the peritoneal side without serosal involvement  Margins:       Margin Status for Invasive Carcinoma: Cystic duct margin is positive for carcinoma  Regional Lymph Nodes: Not applicable (no lymph nodes submitted or found)  Distant Metastasis:       Distant Site(s) Involved: Not applicable  Pathologic Stage Classification (pTNM, AJCC 8th Edition): pT2a, pN not assigned    08/20/2021 Imaging   CT chest IMPRESSION: Negative. No CT evidence for acute intrathoracic abnormality. Negative for pulmonary nodule or evidence for metastatic disease to the chest.   09/01/2021 Tumor Marker   CA 19-9: 8 (normal)   09/04/2021 Initial Diagnosis   Adenocarcinoma of gallbladder (HCC)   09/04/2021 Definitive Surgery   Procedure: by Dr. Sophronia Simas Staging laparoscopy Exploratory laparotomy with intraoperative cholangiogram Excision of new cystic duct margin Partial central hepatectomy (resection of gallbladder fossa - segments 4b and 5) Portal lymph node dissection   09/04/2021 Pathology Results   FINAL MICROSCOPIC DIAGNOSIS:  A. CYSTIC DUCT, NEW MARGIN, EXCISION:  -  No adenocarcinoma identified  B. LYMPH NODE, PORTAL, EXCISION:  -  Nodular fat necrosis and fibrosis  -  No nodal tissue identified  C. GALLBLADDER, FOSSA, EXCISION:  -  Benign liver with periductular chronic inflammation and  macrovesicular steatosis  -  No adenocarcinoma identified  D. LYMPH NODE, STATION EIGHT, EXCISION:  -  No adenocarcinoma identified in one lymph node (0/1)     09/04/2021 Cancer Staging   Staging form: Gallbladder, AJCC 8th Edition - Clinical stage from 09/04/2021: Stage IIA (cT2a, cN0, cM0) - Signed by Pollyann Samples, NP on 09/21/2021 Stage prefix: Initial diagnosis Total positive nodes: 0 Histologic grade (G): G3 Histologic grading system: 3 grade system Histologic sub-type: Adenocarcinoma   09/28/2021 - 09/28/2021 Chemotherapy   Patient is on Treatment Plan : BREAST Capecitabine q21d     09/30/2022 Relapse/Recurrence   FINAL MICROSCOPIC DIAGNOSIS:  A. 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  It is composed of solid sheets and nests of atypical cells with variably enlarged round to oval irregular nuclei and a variable amount of eosinophilic focally vacuolated cytoplasm, and these sheets and nests are admixed with infiltrating irregular angulated glands lined by an atypical columnar epithelium composed of similar-appearing cells.  Glandular lumens contain mucin.  Focally these epithelia are contiguous and are present within the same sheet and/or gland. Five immunohistochemical stains performed with adequate control. The tumor is positive for cytokeratin 7 and the solid sheets and nests are positive for the squamous marker p40.  The tumor is negative for cytokeratin 20.  The tumor is also negative for the GI marker CDX2.  The tumor is negative for the hepatic marker arginase.  The prior cholecystectomy specimen ([id number]) is reviewed and this carcinoma shows identical cytohistomorphology as the previously diagnosed gallbladder adenocarcinoma.  10/01/2022 PET scan   IMPRESSION: 1. Progressive soft tissue mass involving the right lower quadrant ventral abdominal wall is intensely FDG avid compatible with recurrent tumor. 2. No signs of  FDG avid nodal or solid organ metastasis. 3. Multiple fat containing ventral abdominal wall hernias. 4.  Aortic Atherosclerosis (ICD10-I70.0).   10/12/2022 - 10/12/2022 Chemotherapy   Patient is on Treatment Plan : BILIARY TRACT Cisplatin + Gemcitabine D1,8 q21d     10/12/2022 - 10/12/2022 Chemotherapy   Patient is on Treatment Plan : BLADDER Durvalumab (10) q14d     10/19/2022 - 12/02/2022 Chemotherapy   Patient is on Treatment Plan : BILIARY TRACT Cisplatin + Gemcitabine D1,8 q21d     10/19/2022 - 12/02/2022 Chemotherapy   Patient is on Treatment Plan : BLADDER Durvalumab (10) q14d     12/23/2022 Imaging    IMPRESSION: 1. Interval increase in size of right lower quadrant ventral abdominal wall mass compatible with progression of metastatic disease. 2. Round nodular density is only seen on the first and second images localizing to the right middle lobe measuring 1.2 cm. This is incompletely characterized and may represent a new lung nodule. Consider more definitive characterization with CT of the chest. 3. Nonobstructing left renal calculi. 4. Fat containing umbilical hernia.   01/07/2023 Imaging    IMPRESSION: 1. Examination is somewhat limited by breath motion artifact throughout. 2. Spiculated appearing fissural nodule of the posterolateral segment right middle lobe abutting the minor fissure measuring 1.0 x 1.0 cm corresponding to finding of prior CT abdomen pelvis. This is new compared to most recent prior imaging of the chest and prior PET-CT, and is worrisome for a pulmonary metastasis, or perhaps alternately primary lung malignancy. 3. No evidence of lymph   01/31/2023 -  Chemotherapy   Patient is on Treatment Plan : COLORECTAL FOLFOX q14d x 6 months        INTERVAL HISTORY:  Michele Arnold is here for a follow up of recurrent gallbladder cancer    She was last seen by me on 01/14/2023 She presents to the clinic accompanied by family an interpreter. Pt stated she  finished radiation on Tuesday. Pt reports she feels a little better since last week. Pt stated she is still  eating very little. Pt reports the pain in the abdomen is still there , some days its a little stronger. Pt states she take oxycodone 2x a day for pain. Pt state she felt a little better after receiving IV hydration as well.      All other systems were reviewed with the patient and are negative.  MEDICAL HISTORY:  Past Medical History:  Diagnosis Date   GB CA    Hypertension     SURGICAL HISTORY: Past Surgical History:  Procedure Laterality Date   CHOLECYSTECTOMY  07/23/2021   INTRAOPERATIVE CHOLANGIOGRAM N/A 09/04/2021   Procedure: INTRAOPERATIVE CHOLANGIOGRAM;  Surgeon: Fritzi Mandes, MD;  Location: [facility];  Service: General;  Laterality: N/A;   LAPAROSCOPY N/A 09/04/2021   Procedure: STAGING LAPAROSCOPY;  Surgeon: Fritzi Mandes, MD;  Location: [facility] OR;  Service: General;  Laterality: N/A;   LYMPH NODE DISSECTION N/A 09/04/2021   Procedure: PORTAL LYMPH NODE DISSECTION;  Surgeon: Fritzi Mandes, MD;  Location: [facility] OR;  Service: General;  Laterality: N/A;   OPEN HEPATECTOMY  N/A 09/04/2021   Procedure: OPEN PARTIAL CENTRAL HEPATECTOMY;  Surgeon: Fritzi Mandes, MD;  Location: [facility] OR;  Service: General;  Laterality: N/A;   PORTACATH PLACEMENT Right 10/15/2022  Procedure: INSERTION PORT-A-CATH;  Surgeon: Fritzi Mandes, MD;  Location: [facility] OR;  Service: General;  Laterality: Right;    I have reviewed the social history and family history with the patient and they are unchanged from previous note.  ALLERGIES:  has No Known Allergies.  MEDICATIONS:  Current Outpatient Medications  Medication Sig Dispense Refill   acetaminophen (TYLENOL) 325 MG tablet Take 2 tablets (650 mg total) by mouth every 6 (six) hours as needed for mild pain or fever. (Patient not taking: Reported on 10/14/2022)     atorvastatin (LIPITOR) 40 MG tablet Take 40 mg by mouth daily.     dexamethasone  (DECADRON) 4 MG tablet Take 1 tablet (4 mg total) by mouth 2 (two) times daily with a meal. 30 tablet 0   hydrochlorothiazide (MICROZIDE) 12.5 MG capsule Take 12.5 mg by mouth daily.     HYDROcodone-acetaminophen (NORCO/VICODIN) 5-325 MG tablet Take 1 tablet by mouth every 6 (six) hours as needed for severe pain. (Patient not taking: Reported on 12/30/2022) 90 tablet 0   lisinopril (ZESTRIL) 10 MG tablet Take 1 tablet (10 mg total) by mouth daily. 30 tablet 0   magic mouthwash (nystatin, lidocaine, diphenhydrAMINE, alum & mag hydroxide) suspension Swish and swallow 5 mLs by mouth 3 (three) times daily as needed for mouth pain. 140 mL 1   megestrol (MEGACE) 40 MG tablet Take 10 tablets (400 mg total) by mouth daily. 150 tablet 1   morphine (MS CONTIN) 15 MG 12 hr tablet Take 1 tablet (15 mg total) by mouth every 12 (twelve) hours. 60 tablet 0   nystatin (MYCOSTATIN) 100000 UNIT/ML suspension Take 5 mLs by mouth 4 times daily. Swish and swallow. 60 mL 0   oxyCODONE (OXY IR/ROXICODONE) 5 MG immediate release tablet Take 1 tablet (5 mg total) by mouth every 6 (six) hours as needed for severe pain. 60 tablet 0   urea (CARMOL) 10 % cream Apply topically to hands and feet 2 to 3 times daily as needed 71 g 2   No current facility-administered medications for this visit.   Facility-Administered Medications Ordered in Other Visits  Medication Dose Route Frequency Provider Last Rate Last Admin   0.9 %  sodium chloride infusion (Manually program via Guardrails IV Fluids)  250 mL Intravenous Once Malachy Mood, MD       0.9 %  sodium chloride infusion   Intravenous Continuous Malachy Mood, MD 500 mL/hr at 01/20/23 0837 New Bag at 01/20/23 0837   acetaminophen (TYLENOL) tablet 650 mg  650 mg Oral Once Malachy Mood, MD       diphenhydrAMINE (BENADRYL) capsule 25 mg  25 mg Oral Once Malachy Mood, MD       heparin lock flush 100 unit/mL  500 Units Intracatheter Once Malachy Mood, MD       sodium chloride flush (NS) 0.9 %  injection 10 mL  10 mL Intracatheter Once Malachy Mood, MD        PHYSICAL EXAMINATION: ECOG PERFORMANCE STATUS: 3 - Symptomatic, >50% confined to bed  Vitals:   01/20/23 0817  BP: (!) 125/57  Pulse: (!) 110  Resp: 18  Temp: 98.4 F (36.9 C)  SpO2: 100%   Wt Readings from Last 3 Encounters:  01/20/23 136 lb (61.7 kg)  01/07/23 133 lb 12.8 oz (60.7 kg)  12/30/22 139 lb 4 oz (63.2 kg)     ABDOMEN:abdomen soft, (+) tender and normal bowel sounds, (+) Palpable mass size of grapefruit in right upper quadrant.  LABORATORY DATA:  I have reviewed the data as listed    Latest Ref Rng & Units 01/20/2023    7:55 AM 01/14/2023    4:01 PM 12/17/2022    9:21 AM  CBC  WBC 4.0 - 10.5 K/uL 16.1  19.1  7.6   Hemoglobin 12.0 - 15.0 g/dL 7.4  8.1  9.5   Hematocrit 36.0 - 46.0 % 21.5  24.6  28.0   Platelets 150 - 400 K/uL 358  263  492         Latest Ref Rng & Units 01/20/2023    7:55 AM 01/14/2023    4:01 PM 12/17/2022    8:51 AM  CMP  Glucose 70 - 99 mg/dL 409  811  914   BUN 6 - 20 mg/dL 5  9  9    Creatinine 0.44 - 1.00 mg/dL 7.82  9.56  2.13   Sodium 135 - 145 mmol/L 133  130  132   Potassium 3.5 - 5.1 mmol/L 3.9  4.3  4.3   Chloride 98 - 111 mmol/L 101  97  99   CO2 22 - 32 mmol/L 24  26  24    Calcium 8.9 - 10.3 mg/dL 8.7  08.6  9.1   Total Protein 6.5 - 8.1 g/dL 6.8  6.9  6.6   Total Bilirubin 0.3 - 1.2 mg/dL 1.3  1.3  0.6   Alkaline Phos 38 - 126 U/L 87  98  85   AST 15 - 41 U/L 15  16  21    ALT 0 - 44 U/L 7  7  13        RADIOGRAPHIC STUDIES: I have personally reviewed the radiological images as listed and agreed with the findings in the report. No results found.    Orders Placed This Encounter  Procedures   CBC with Differential (Cancer Center Only)    Standing Status:   Future    Standing Expiration Date:   01/31/2024   CMP (Cancer Center only)    Standing Status:   Future    Standing Expiration Date:   01/31/2024   CBC with Differential (Cancer Center Only)     Standing Status:   Future    Standing Expiration Date:   02/16/2024   CMP (Cancer Center only)    Standing Status:   Future    Standing Expiration Date:   02/16/2024   Informed Consent Details: Physician/Practitioner Attestation; Transcribe to consent form and obtain patient signature    Order Specific Question:   Physician/Practitioner attestation of informed consent for blood and or blood product transfusion    Answer:   I, the physician/practitioner, attest that I have discussed with the patient the benefits, risks, side effects, alternatives, likelihood of achieving goals and potential problems during recovery for the procedure that I have provided informed consent.    Order Specific Question:   Product(s)    Answer:   All Product(s)   All questions were answered. The patient knows to call the clinic with any problems, questions or concerns. No barriers to learning was detected. The total time spent in the appointment was 30 minutes.     Malachy Mood, MD 01/20/2023   Carolin Coy, CMA, am acting as scribe for Malachy Mood, MD.   I have reviewed the above documentation for accuracy and completeness, and I agree with the above.

## 2023-01-19 NOTE — Assessment & Plan Note (Addendum)
With metastatic recurrence in 09/2022, MSS -Initially stage II diagnosed in 07/2021, s/p surgical resection  -she received adjuvant chemotherapy with Xeloda for 6 months.  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  CT scan also showed a new right lower lobe lung nodule, I will obtain a CT chest. -Due to her significant pain from her abdominal wall metastasis, she recently finished palliative RT -FO showed low mutation burden, PIK3CA mutation(+), no other targetable mutations  -her overall PS has dropped significantly since RT,

## 2023-01-19 NOTE — Assessment & Plan Note (Signed)
-  secondary to cancer recurrence at abdominal wall  -on Norco as needed, I will change to oxycodone 5 mg as needed -she is on MS contin '15mg'$  q12h, pain is overall controlled.

## 2023-01-20 ENCOUNTER — Inpatient Hospital Stay: Payer: Medicaid Other

## 2023-01-20 ENCOUNTER — Inpatient Hospital Stay: Payer: Medicaid Other | Admitting: Nutrition

## 2023-01-20 ENCOUNTER — Other Ambulatory Visit (HOSPITAL_COMMUNITY): Payer: Self-pay

## 2023-01-20 ENCOUNTER — Encounter: Payer: Self-pay | Admitting: Hematology

## 2023-01-20 ENCOUNTER — Other Ambulatory Visit: Payer: Self-pay

## 2023-01-20 ENCOUNTER — Inpatient Hospital Stay (HOSPITAL_BASED_OUTPATIENT_CLINIC_OR_DEPARTMENT_OTHER): Payer: Medicaid Other | Admitting: Hematology

## 2023-01-20 VITALS — BP 103/46 | HR 75 | Temp 98.7°F | Resp 20

## 2023-01-20 VITALS — BP 125/57 | HR 110 | Temp 98.4°F | Resp 18 | Ht 64.0 in | Wt 136.0 lb

## 2023-01-20 DIAGNOSIS — G893 Neoplasm related pain (acute) (chronic): Secondary | ICD-10-CM | POA: Diagnosis not present

## 2023-01-20 DIAGNOSIS — D649 Anemia, unspecified: Secondary | ICD-10-CM

## 2023-01-20 DIAGNOSIS — C23 Malignant neoplasm of gallbladder: Secondary | ICD-10-CM

## 2023-01-20 DIAGNOSIS — R1010 Upper abdominal pain, unspecified: Secondary | ICD-10-CM

## 2023-01-20 DIAGNOSIS — Z95828 Presence of other vascular implants and grafts: Secondary | ICD-10-CM

## 2023-01-20 LAB — COMPREHENSIVE METABOLIC PANEL
ALT: 7 U/L (ref 0–44)
AST: 15 U/L (ref 15–41)
Albumin: 2.5 g/dL — ABNORMAL LOW (ref 3.5–5.0)
Alkaline Phosphatase: 87 U/L (ref 38–126)
Anion gap: 8 (ref 5–15)
BUN: 5 mg/dL — ABNORMAL LOW (ref 6–20)
CO2: 24 mmol/L (ref 22–32)
Calcium: 8.7 mg/dL — ABNORMAL LOW (ref 8.9–10.3)
Chloride: 101 mmol/L (ref 98–111)
Creatinine, Ser: 0.39 mg/dL — ABNORMAL LOW (ref 0.44–1.00)
GFR, Estimated: >60 mL/min (ref >60)
Glucose, Bld: 133 mg/dL — ABNORMAL HIGH (ref 70–99)
Potassium: 3.9 mmol/L (ref 3.5–5.1)
Sodium: 133 mmol/L — ABNORMAL LOW (ref 135–145)
Total Bilirubin: 1.3 mg/dL — ABNORMAL HIGH (ref 0.3–1.2)
Total Protein: 6.8 g/dL (ref 6.5–8.1)

## 2023-01-20 LAB — CBC WITH DIFFERENTIAL/PLATELET
Abs Immature Granulocytes: 0.16 10*3/uL — ABNORMAL HIGH (ref 0.00–0.07)
Basophils Absolute: 0 10*3/uL (ref 0.0–0.1)
Basophils Relative: 0 %
Eosinophils Absolute: 0.1 10*3/uL (ref 0.0–0.5)
Eosinophils Relative: 0 %
HCT: 21.5 % — ABNORMAL LOW (ref 36.0–46.0)
Hemoglobin: 7.4 g/dL — ABNORMAL LOW (ref 12.0–15.0)
Immature Granulocytes: 1 %
Lymphocytes Relative: 4 %
Lymphs Abs: 0.6 10*3/uL — ABNORMAL LOW (ref 0.7–4.0)
MCH: 31.6 pg (ref 26.0–34.0)
MCHC: 34.4 g/dL (ref 30.0–36.0)
MCV: 91.9 fL (ref 80.0–100.0)
Monocytes Absolute: 0.7 10*3/uL (ref 0.1–1.0)
Monocytes Relative: 4 %
Neutro Abs: 14.6 10*3/uL — ABNORMAL HIGH (ref 1.7–7.7)
Neutrophils Relative %: 91 %
Platelets: 358 10*3/uL (ref 150–400)
RBC: 2.34 MIL/uL — ABNORMAL LOW (ref 3.87–5.11)
RDW: 16.8 % — ABNORMAL HIGH (ref 11.5–15.5)
WBC: 16.1 10*3/uL — ABNORMAL HIGH (ref 4.0–10.5)
nRBC: 0 % (ref 0.0–0.2)

## 2023-01-20 LAB — SAMPLE TO BLOOD BANK

## 2023-01-20 LAB — PREPARE RBC (CROSSMATCH)

## 2023-01-20 MED ORDER — SODIUM CHLORIDE 0.9% FLUSH
10.0000 mL | Freq: Once | INTRAVENOUS | Status: AC
  Administered 2023-01-20: 10 mL

## 2023-01-20 MED ORDER — ACETAMINOPHEN 325 MG PO TABS
650.0000 mg | ORAL_TABLET | Freq: Once | ORAL | Status: AC
  Administered 2023-01-20: 650 mg via ORAL
  Filled 2023-01-20: qty 2

## 2023-01-20 MED ORDER — DIPHENHYDRAMINE HCL 25 MG PO CAPS
25.0000 mg | ORAL_CAPSULE | Freq: Once | ORAL | Status: AC
  Administered 2023-01-20: 25 mg via ORAL
  Filled 2023-01-20: qty 1

## 2023-01-20 MED ORDER — HEPARIN SOD (PORK) LOCK FLUSH 100 UNIT/ML IV SOLN
500.0000 [IU] | Freq: Once | INTRAVENOUS | Status: AC
  Administered 2023-01-20: 500 [IU]

## 2023-01-20 MED ORDER — SODIUM CHLORIDE 0.9 % IV SOLN: INTRAVENOUS | Status: AC

## 2023-01-20 MED ORDER — SODIUM CHLORIDE 0.9% IV SOLUTION
250.0000 mL | Freq: Once | INTRAVENOUS | Status: AC
  Administered 2023-01-20: 250 mL via INTRAVENOUS

## 2023-01-20 MED ORDER — MORPHINE SULFATE ER 15 MG PO TBCR: 15.0000 mg | EXTENDED_RELEASE_TABLET | Freq: Two times a day (BID) | ORAL | 0 refills | Status: DC

## 2023-01-20 NOTE — Patient Instructions (Addendum)
Rehidratacin en los adultos Rehydration, Adult La rehidratacin es la reposicin de lquidos, sales y minerales del cuerpo (electrolitos) que se pierden Biochemist, clinical. La deshidratacin se da cuando hay una cantidad insuficiente de agua u otros lquidos en el organismo. Esto ocurre cuando se pierden ms lquidos de los que se ingieren. [place] causas frecuentes de deshidratacin, se incluyen: No beber la cantidad suficiente de lquidos. Esto puede suceder cuando se enferma o hace actividades que requieren mucha energa, especialmente cuando hace calor. Afecciones que causan prdida de agua u otros lquidos. Estas incluyen diarrea, vmitos, sudoracin y [place]. Otras enfermedades, como fiebre o infeccin. Determinados medicamentos, como aquellos que eliminan el exceso de lquido del cuerpo (diurticos). Los sntomas de la deshidratacin leve o moderada pueden incluir sed, sequedad de los labios y de la boca, y Tree surgeon. Los sntomas de la deshidratacin grave pueden incluir aumento de la frecuencia cardaca, confusin, desmayos e imposibilidad de Garment/textile technologist. En casos graves, es posible que deba recibir lquidos por va intravenosa en el hospital. En casos leves o moderados, generalmente puede rehidratarse en su casa tomando ciertos lquidos que le haya indicado el mdico. Naschitti son los riesgos? El [employer] El [employer] Esto puede incluir tomar demasiado lquido (sobrehidratacin). Esto es poco frecuente. La sobrehidratacin puede causar un desequilibrio de los electrolitos, insuficiencia renal o una disminucin de los niveles de sal (sodio) en el cuerpo. Materiales necesarios: Designer, industrial/product una solucin de rehidratacin oral (SRO) si el mdico se lo indica. Se trata de una bebida que es para tratar la deshidratacin. Se puede conseguir en farmacias y tiendas. Cmo rehidratarse [place] instrucciones del mdico respecto de  qu beber. El tipo y la cantidad de lquido que debe beber dependen de su afeccin. En general, debe elegir las bebidas que prefiera. Si se lo indic el mdico, tome una SRO. Para preparar una SRO, siga las instrucciones del envase. Comience por beber pequeas cantidades, aproximadamente  taza (120 ml) cada 5 a 10 minutos. Aumente lentamente la cantidad que bebe hasta que haya ingerido la cantidad recomendada por el mdico. Beba suficiente lquidos transparentes como para mantener la orina de color amarillo plido. Si le indicaron que beba una SRO, termnela primero y [place] [employer] a beber lentamente otros lquidos transparentes. Beba lquidos, por ejemplo: Agua. Esto incluye agua con gas y agua saborizada. Si bebe solo agua, puede disminuir mucho la cantidad de sodio en el cuerpo (hiponatremia). Siga el consejo del mdico. Agua de trocitos de hielo que usted succiona. Jugo de frutas con agua agregada (diluido). Bebidas deportivas. Ts de hierbas calientes o fros. [place]opas a base de caldo. Leche o productos lcteos. [place] instrucciones del mdico respecto de lo que puede comer [place] se Electrical engineer. El mdico puede recomendarle que comience a comer despacio alimentos habituales en pequeas cantidades. Consuma los alimentos que contienen un equilibrio saludable de Brewing technologist, como las bananas, las [place], las papas, los tomates y Nurse, mental health. Evite los alimentos grasos y muy azucarados. En algunos casos, puede alimentarse a travs de una sonda de alimentacin que se coloca a travs de la nariz hasta el estmago (sonda nasogstrica o sonda NG). Esto puede hacerse si tiene vmitos o diarrea que no pueden controlarse. Bebidas que se deben evitar  Ciertas bebidas pueden empeorar la deshidratacin. Mientras se rehidrata, evite consumir alcohol. Cmo saber si se est recuperando de la deshidratacin Es posible que est mejorando si: Orina con ms frecuencia que antes de  comenzar la  rehidratacin. Su orina es de [employer] plido. Mejora el nivel de [place]. Vomita con menos frecuencia. Tiene diarrea con menos frecuencia. El apetito mejora o vuelve a la normalidad. Se siente menos mareado o menos aturdido. El color y el tono de la piel comienzan a lucir ms normales. Siga estas instrucciones en su casa: Use los medicamentos de venta libre y los recetados solamente como se lo haya indicado el mdico. No tome comprimidos de sodio. Hacer esto puede aumentar la concentracin de sodio en el organismo (hipernatremia). Comunquese con un mdico si: Contina teniendo sntomas de deshidratacin leve o moderada, por ejemplo: Sed. Labios secos. Sequedad leve en la boca. Mareos. Orina de color oscuro o menos orina que lo normal. Calambres musculares. Sigue con vmitos o diarrea. Solicite ayuda de inmediato si: Tiene sntomas de deshidratacin que empeoran. Tiene fiebre. Tiene un dolor de cabeza intenso. Ha estado vomitando y tiene problemas, por ejemplo: Los vmitos empeoran o no desaparecen. El vmito tiene sangre o una sustancia verde (bilis). No puede comer ni beber sin vomitar. Tiene problemas con la miccin o las deposiciones, por ejemplo: Diarrea que empeora o que no desaparece. Sangre en la materia fecal (heces). Esto puede hacer que la materia fecal sea negra y de aspecto alquitranado. No orina u orina solamente una pequea cantidad de color muy oscuro en el trmino de 6 a 8 horas. Tiene dificultad para respirar. Tiene sntomas que empeoran con [employer]. Estos sntomas pueden Sales executive. Solicite ayuda de inmediato. Llame al 911. No espere a ver si los sntomas desaparecen. No conduzca por sus propios medios [employer]. Esta informacin no tiene Marine scientist el consejo del mdico. Asegrese de hacerle al mdico cualquier pregunta que tenga. Document Revised: 04/13/2022 Document Reviewed: 04/13/2022 Elsevier Patient Education   [place] en los adultos, [place] Blood Transfusion, Adult, Care After Despus de una transfusin de [place], es comn presentar lo siguiente: [place] y Management consultant de la va intravenosa (IV). Dolor de [place]. Siga estas instrucciones en su casa: El mdico podr darle ms instrucciones. Si tiene problemas, llame al [employer]. Cuidados del lugar de la insercin     Siga las instrucciones del mdico en lo que respecta al cuidado del Environmental consultant de insercin. Este es el lugar donde se coloc un tubo (catter) intravenoso en la vena. Asegrese de hacer lo siguiente: Lvese las manos con agua y jabn durante al menos 20 segundos antes y despus de cambiarse la venda. Use un desinfectante para manos si no dispone de [place] y [place]. Cambie las vendas como se lo haya indicado el mdico. Psychiatric nurse de insercin todos los das para detectar signos de infeccin. Est atento a los siguientes signos: Dolor, hinchazn o enrojecimiento. Sangrado proveniente del Environmental consultant. Calor. Pus o mal olor. Instrucciones generales Use los medicamentos de venta libre y los recetados solamente como se lo haya indicado el mdico. Haga reposo como se lo haya indicado el mdico. Retome sus actividades habituales como se lo haya indicado el mdico. Concurra a todas las visitas de seguimiento. Es posible que deba hacerse anlisis en ciertos momentos para Materials engineer. Comunquese con un mdico si: Tiene picazn o zonas enrojecidas e hinchadas en la piel (urticaria). Tiene fiebre o escalofros. Tiene dolor de cabeza, en la espalda o el pecho. Est preocupado o nervioso (ansioso). Se siente dbil despus de realizar sus actividades habituales. Tiene alguno de los siguientes Scientist, clinical (histocompatibility and immunogenetics)  de la insercin: Enrojecimiento, hinchazn, calor o dolor. Sangrado que no se detiene al [employer] presin. Pus o mal olor. Si recibi la transfusin de sangre en un entorno  para pacientes ambulatorios, le indicarn con quin debe ponerse en contacto para informar cualquier reaccin. Solicite ayuda de inmediato si: Tiene signos de una reaccin grave. Puede deberse a IT trainer o provenir del sistema de defensa del cuerpo (sistema inmunitario). Algunos signos son los siguientes: Dificultad para respirar o falta de aire. Hinchazn en la cara o sensacin de calor (sofoco). Una erupcin cutnea generalizada. Pis (orina) de color oscuro o sangre en el pis. Latidos cardacos acelerados. Estos sntomas pueden Sales executive. Solicite ayuda de inmediato. Llame al 911. No espere a ver si los sntomas desaparecen. No conduzca por sus propios medios [employer]. Resumen Es normal tener moretones y Management consultant donde se coloc la va intravenosa (IV). Psychiatric nurse de insercin todos los das para detectar signos de infeccin. Haga reposo como se lo haya indicado el mdico. Retome sus actividades habituales como se lo haya indicado el mdico. Obtenga ayuda de inmediato si tiene signos de [place] reaccin grave. Esta informacin no tiene Marine scientist el consejo del mdico. Asegrese de hacerle al mdico cualquier pregunta que tenga. Document Revised: 02/25/2022 Document Reviewed: 02/25/2022 Elsevier Patient Education  [place].

## 2023-01-20 NOTE — Progress Notes (Signed)
Nutrition follow-up completed with patient and interpreter during infusion.  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  Weight documented as 136 pounds on March 7.  This is decreased from 144 pounds 3.2 ounces February 2.  Noted labs: Sodium 133, BUN 5, creatinine 0.39, and albumin 2.5.  Patient reports her tongue hurts all the time.  It is very pink.  Reports taste alterations results in her not eating.  She adamantly denies issues chewing and swallowing.  She denies nausea and vomiting.  She reports constipation has improved.  She has not been using Magic mouthwash as prescribed and states she maybe uses it twice a day but she does not feel like it helps.  She has not used baking soda and salt water gargles to help with taste alterations.  I am unclear if patient is taking appetite stimulant. Patient has a 6% weight loss over 1 month.    Nutrition diagnosis: Food and nutrition related knowledge deficit, ongoing.  Intervention: Attempted to stress importance of patient's compliance to MD recommendations for medications and RD recommendations for improving taste alterations.  Explained it is difficult to know if strategies are effective if patient is not using medications/strategies as prescribed/recommended. Encourage patient to try baking soda and salt water rinses frequently throughout the day.  Reviewed additional strategies for taste alterations. Utilize Magic mouthwash as prescribed. Consider Carafate to coat mouth and throat. Drink Ensure Plus or equivalent 3-4 cartons daily. Consume soft foods utilizing strategies for improving taste. Provided samples of strawberry and vanilla Ensure complete along with Spanish facts sheets on increasing calories and protein and managing taste alterations.  Monitoring, evaluation, goals: Patient will increase calories and protein to minimize further weight loss.  Next visit: Will continue to follow with  upcoming treatments as needed.  **Disclaimer: This note was dictated with voice recognition software. Similar sounding words can inadvertently be transcribed and this note may contain transcription errors which may not have been corrected upon publication of note.**

## 2023-01-21 LAB — BPAM RBC
Blood Product Expiration Date: 202404092359
Blood Product Expiration Date: 202404092359
ISSUE DATE / TIME: 202403071117
ISSUE DATE / TIME: 202403071313
Unit Type and Rh: 5100
Unit Type and Rh: 5100

## 2023-01-21 LAB — TYPE AND SCREEN
ABO/RH(D): O POS
Antibody Screen: NEGATIVE
Unit division: 0
Unit division: 0

## 2023-02-01 MED FILL — Dexamethasone Sodium Phosphate Inj 100 MG/10ML: INTRAMUSCULAR | Qty: 1 | Status: AC

## 2023-02-02 ENCOUNTER — Inpatient Hospital Stay: Payer: Medicaid Other

## 2023-02-02 ENCOUNTER — Other Ambulatory Visit: Payer: Self-pay

## 2023-02-02 ENCOUNTER — Encounter: Payer: Self-pay | Admitting: Nurse Practitioner

## 2023-02-02 ENCOUNTER — Inpatient Hospital Stay (HOSPITAL_BASED_OUTPATIENT_CLINIC_OR_DEPARTMENT_OTHER): Payer: Medicaid Other | Admitting: Nurse Practitioner

## 2023-02-02 DIAGNOSIS — C23 Malignant neoplasm of gallbladder: Secondary | ICD-10-CM

## 2023-02-02 DIAGNOSIS — G893 Neoplasm related pain (acute) (chronic): Secondary | ICD-10-CM | POA: Diagnosis not present

## 2023-02-02 MED ORDER — DEXAMETHASONE 4 MG PO TABS: 4.0000 mg | ORAL_TABLET | Freq: Every day | ORAL | 1 refills | Status: DC

## 2023-02-02 MED ORDER — SODIUM CHLORIDE 0.9% FLUSH: 10.0000 mL | Freq: Once | INTRAVENOUS | Status: DC

## 2023-02-02 MED ORDER — MORPHINE SULFATE ER 15 MG PO TBCR: 15.0000 mg | EXTENDED_RELEASE_TABLET | Freq: Three times a day (TID) | ORAL | 0 refills | Status: DC

## 2023-02-02 NOTE — Progress Notes (Signed)
Verbal order given from Cira Rue, NP for Hospice with [facility].  Margaretmary Eddy RN with [facility] responded to [employer] stating she will start the admission process with her intake team.

## 2023-02-02 NOTE — Progress Notes (Addendum)
Patient Care Team: Geradine Girt, DO as PCP - General (Internal Medicine) Dwan Bolt, MD as Consulting Physician (General Surgery) Truitt Merle, MD as Consulting Physician (Hematology) Grant as Consulting Physician (Radiology)   CHIEF COMPLAINT: Follow up recurrent gallbladder cancer   Oncology History Overview Note   Cancer Staging  Adenocarcinoma of gallbladder [facility]) Staging form: Gallbladder, AJCC 8th Edition - Clinical stage from 09/04/2021: Stage IIA (cT2a, cN0, cM0) - Signed by Alla Feeling, NP on 09/21/2021 Stage prefix: Initial diagnosis Total positive nodes: 0 Histologic grade (G): G3 Histologic grading system: 3 grade system Histologic sub-type: Adenocarcinoma     Adenocarcinoma of gallbladder ([place])  07/21/2021 Imaging   CT AP IMPRESSION: 1. Cholelithiasis with distended gallbladder. No CT evidence of acute cholecystitis. Consider further evaluation with right upper quadrant ultrasound as clinically indicated. 2. Punctate nonobstructive left nephrolithiasis. 3. Aortic Atherosclerosis (ICD10-I70.0).   07/21/2021 Imaging   ABD [place] RUQ IMPRESSION: 1. Cholelithiasis without evidence of acute cholecystitis. 2. Mild hepatic steatosis.     07/21/2021 Imaging   MRA/MRCP IMPRESSION: 1. No biliary ductal dilation. No choledocholithiasis. 2. Mild hepatic steatosis. 3. Cholelithiasis with dilation of the gallbladder and trace pericholecystic fluid. No gallbladder wall thickening or abnormal wall enhancement. Findings which are equivocal for acute cholecystitis. Consider further evaluation with nuclear medicine HIDA scan to assess cystic duct patency if clinically indicated.   07/22/2021 Imaging   HIDA SCAN IMPRESSION: Scintigraphic findings most consistent with acute cholecystitis.   07/23/2021 Surgery   Preop Dx:        Acute cholecystitis Postop Dx:      Acute cholecystitis with 1 cm stone impacted in the infundibulum Procedure:      Xi  robotic cholecystectomy with ICG By Dr. Johnathan Hausen   07/23/2021 Pathology Results   FINAL MICROSCOPIC DIAGNOSIS:  A. GALLBLADDER, CHOLECYSTECTOMY:  - Invasive poorly differentiated adenocarcinoma, 4.3 cm, involving  gallbladder neck  - Carcinoma invades perimuscular soft tissue  - Cystic duct margin is focally positive for carcinoma  - Focally suspicious for lymphovascular invasion   Procedure: Cholecystectomy  Tumor Site: Gallbladder neck  Tumor Size: 4.3 cm  Histologic Type: Adenocarcinoma  Histologic Grade: G3: Poorly differentiated  Tumor Extension: Tumor invades perimuscular connective tissue on the peritoneal side without serosal involvement  Margins:       Margin Status for Invasive Carcinoma: Cystic duct margin is positive for carcinoma  Regional Lymph Nodes: Not applicable (no lymph nodes submitted or found)  Distant Metastasis:       Distant Site(s) Involved: Not applicable  Pathologic Stage Classification (pTNM, AJCC 8th Edition): pT2a, pN not assigned    08/20/2021 Imaging   CT chest IMPRESSION: Negative. No CT evidence for acute intrathoracic abnormality. Negative for pulmonary nodule or evidence for metastatic disease to the chest.   09/01/2021 Tumor Marker   CA 19-9: 8 (normal)   09/04/2021 Initial Diagnosis   Adenocarcinoma of gallbladder ([place])   09/04/2021 Definitive Surgery   Procedure: by Dr. Michaelle Birks Staging laparoscopy Exploratory laparotomy with intraoperative cholangiogram Excision of new cystic duct margin Partial central hepatectomy (resection of gallbladder fossa - segments 4b and 5) Portal lymph node dissection   09/04/2021 Pathology Results   FINAL MICROSCOPIC DIAGNOSIS:  A. CYSTIC DUCT, NEW MARGIN, EXCISION:  -  No adenocarcinoma identified  B. LYMPH NODE, PORTAL, EXCISION:  -  Nodular fat necrosis and fibrosis  -  No nodal tissue identified  C. GALLBLADDER, FOSSA, EXCISION:  -  Benign  liver with periductular chronic inflammation  and  macrovesicular steatosis  -  No adenocarcinoma identified  D. LYMPH NODE, STATION EIGHT, EXCISION:  -  No adenocarcinoma identified in one lymph node (0/1)    09/04/2021 Cancer Staging   Staging form: Gallbladder, AJCC 8th Edition - Clinical stage from 09/04/2021: Stage IIA (cT2a, cN0, cM0) - Signed by Alla Feeling, NP on 09/21/2021 Stage prefix: Initial diagnosis Total positive nodes: 0 Histologic grade (G): G3 Histologic grading system: 3 grade system Histologic sub-type: Adenocarcinoma   09/28/2021 - 09/28/2021 Chemotherapy   Patient is on Treatment Plan : BREAST Capecitabine q21d     09/30/2022 Relapse/Recurrence   FINAL MICROSCOPIC DIAGNOSIS:  A. 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  It is composed of solid sheets and nests of atypical cells with variably enlarged round to oval irregular nuclei and a variable amount of eosinophilic focally vacuolated cytoplasm, and these sheets and nests are admixed with infiltrating irregular angulated glands lined by an atypical columnar epithelium composed of similar-appearing cells.  Glandular lumens contain mucin.  Focally these epithelia are contiguous and are present within the same sheet and/or gland. Five immunohistochemical stains performed with adequate control. The tumor is positive for cytokeratin 7 and the solid sheets and nests are positive for the squamous marker p40.  The tumor is negative for cytokeratin 20.  The tumor is also negative for the GI marker CDX2.  The tumor is negative for the hepatic marker arginase.  The prior cholecystectomy specimen [id number]) is reviewed and this carcinoma shows identical cytohistomorphology as the previously diagnosed gallbladder adenocarcinoma.    10/01/2022 PET scan    IMPRESSION: 1. Progressive soft tissue mass involving the right lower quadrant ventral abdominal wall is intensely FDG avid compatible with recurrent tumor. 2. No signs of FDG avid nodal or solid organ metastasis. 3. Multiple fat containing ventral abdominal wall hernias. 4.  Aortic Atherosclerosis (ICD10-I70.0).   10/12/2022 - 10/12/2022 Chemotherapy   Patient is on Treatment Plan : BILIARY TRACT Cisplatin + Gemcitabine D1,8 q21d     10/12/2022 - 10/12/2022 Chemotherapy   Patient is on Treatment Plan : BLADDER Durvalumab (10) q14d     10/19/2022 - 12/02/2022 Chemotherapy   Patient is on Treatment Plan : BILIARY TRACT Cisplatin + Gemcitabine D1,8 q21d     10/19/2022 - 12/02/2022 Chemotherapy   Patient is on Treatment Plan : BLADDER Durvalumab (10) q14d     12/23/2022 Imaging    IMPRESSION: 1. Interval increase in size of right lower quadrant ventral abdominal wall mass compatible with progression of metastatic disease. 2. Round nodular density is only seen on the first and second images localizing to the right middle lobe measuring 1.2 cm. This is incompletely characterized and may represent a new lung nodule. Consider more definitive characterization with CT of the chest. 3. Nonobstructing left renal calculi. 4. Fat containing umbilical hernia.   01/07/2023 Imaging    IMPRESSION: 1. Examination is somewhat limited by breath motion artifact throughout. 2. Spiculated appearing fissural nodule of the posterolateral segment right middle lobe abutting the minor fissure measuring 1.0 x 1.0 cm corresponding to finding of prior CT abdomen pelvis. This is new compared to most recent prior imaging of the chest and prior PET-CT, and is worrisome for a pulmonary metastasis, or perhaps alternately primary lung malignancy. 3. No evidence of lymph   01/31/2023 -  01/31/2023 Chemotherapy   Patient is on Treatment Plan : COLORECTAL FOLFOX q14d x 6 months        CURRENT THERAPY:  -First line  gemcitabine/cisplatin on days 1 and 8 q21 days and durvalumab q21 days; starting 10/19/22  - 01/20/23 stopped due to progression  -PENDING second line FOLFOX q14 days  -Palliative RT to abdominal wall mass 01/05/23 - 01/18/23  INTERVAL HISTORY Michele Arnold returns for follow up as scheduled. Last seen by Dr. Burr Medico 01/20/23. Chemo was held due to poor PS, dehydration, and anemia.  She received fluids and blood transfusion.  Today she presents in a wheelchair with her husband and Spanish speaking interpreter.  Feels worse since last visit.  Taking Megace, but appetite still low.  Anytime she eats or drinks he feels pain in her back and lungs.  Feels she has been suffering.  She is taking MS Contin twice daily and oxycodone 1-2 times a day.  She declined port access and any treatment today, she wants to go home and focus on not suffering.  \   ROS  All other systems reviewed and negative  Past Medical History:  Diagnosis Date   GB CA    Hypertension      Past Surgical History:  Procedure Laterality Date   CHOLECYSTECTOMY  07/23/2021   INTRAOPERATIVE CHOLANGIOGRAM N/A 09/04/2021   Procedure: INTRAOPERATIVE CHOLANGIOGRAM;  Surgeon: Dwan Bolt, MD;  Location: [place];  Service: General;  Laterality: N/A;   LAPAROSCOPY N/A 09/04/2021   Procedure: STAGING LAPAROSCOPY;  Surgeon: Dwan Bolt, MD;  Location: [place];  Service: General;  Laterality: N/A;   LYMPH NODE DISSECTION N/A 09/04/2021   Procedure: PORTAL LYMPH NODE DISSECTION;  Surgeon: Dwan Bolt, MD;  Location: [place];  Service: General;  Laterality: N/A;   OPEN HEPATECTOMY  N/A 09/04/2021   Procedure: OPEN PARTIAL CENTRAL HEPATECTOMY;  Surgeon: Dwan Bolt, MD;  Location: [place];  Service: General;  Laterality: N/A;   PORTACATH PLACEMENT Right 10/15/2022   Procedure: INSERTION PORT-A-CATH;  Surgeon: Dwan Bolt, MD;  Location: [place];  Service: General;  Laterality: Right;     Outpatient Encounter Medications as of 02/02/2023   Medication Sig Note   megestrol (MEGACE) 40 MG tablet Take 10 tablets (400 mg total) by mouth daily.    oxyCODONE (OXY IR/ROXICODONE) 5 MG immediate release tablet Take 1 tablet (5 mg total) by mouth every 6 (six) hours as needed for severe pain.    prochlorperazine (COMPAZINE) 10 MG tablet Take 10 mg by mouth every 6 (six) hours as needed for nausea or vomiting.    [DISCONTINUED] morphine (MS CONTIN) 15 MG 12 hr tablet Take 1 tablet (15 mg total) by mouth every 12 (twelve) hours.    acetaminophen (TYLENOL) 325 MG tablet Take 2 tablets (650 mg total) by mouth every 6 (six) hours as needed for mild pain or fever. (Patient not taking: Reported on 02/02/2023)    dexamethasone (DECADRON) 4 MG tablet Take 1 tablet (4 mg total) by mouth daily.    magic mouthwash (nystatin, lidocaine, diphenhydrAMINE, alum & mag hydroxide) suspension Swish and swallow 5 mLs by mouth 3 (three) times daily as needed for mouth pain. (Patient not taking: Reported on 02/02/2023)    morphine (MS CONTIN) 15 MG 12 hr tablet Take 1 tablet (15 mg total) by mouth every 8 (eight) hours.    nystatin (MYCOSTATIN) 100000 UNIT/ML suspension Take 5 mLs by mouth 4 times daily. Swish and swallow. (Patient not  taking: Reported on 02/02/2023)    urea (CARMOL) 10 % cream Apply topically to hands and feet 2 to 3 times daily as needed (Patient not taking: Reported on 02/02/2023)    [DISCONTINUED] atorvastatin (LIPITOR) 40 MG tablet Take 40 mg by mouth daily. (Patient not taking: Reported on 02/02/2023) 02/02/2023: hospice   [DISCONTINUED] dexamethasone (DECADRON) 4 MG tablet Take 1 tablet (4 mg total) by mouth 2 (two) times daily with a meal. (Patient not taking: Reported on 02/02/2023)    [DISCONTINUED] dexamethasone (DECADRON) 4 MG tablet Take 1 tablet (4 mg total) by mouth daily. 02/02/2023: Pt is now on Hospice therefore no longer receiving chemotherapy   [DISCONTINUED] hydrochlorothiazide (MICROZIDE) 12.5 MG capsule Take 12.5 mg by mouth daily.  (Patient not taking: Reported on 02/02/2023) 02/02/2023: hospice   [DISCONTINUED] HYDROcodone-acetaminophen (NORCO/VICODIN) 5-325 MG tablet Take 1 tablet by mouth every 6 (six) hours as needed for severe pain. (Patient not taking: Reported on 12/30/2022) 02/02/2023: on oxycodone   [DISCONTINUED] lisinopril (ZESTRIL) 10 MG tablet Take 1 tablet (10 mg total) by mouth daily. (Patient not taking: Reported on 02/02/2023) 02/02/2023: hospice   [DISCONTINUED] sodium chloride flush (NS) 0.9 % injection 10 mL     No facility-administered encounter medications on file as of 02/02/2023.     Today's Vitals   02/02/23 1120  BP: 132/72  Pulse: (!) 105  Resp: 14  Temp: 98.1 F (36.7 C)  TempSrc: Temporal  SpO2: 98%  Weight: 125 lb 3.2 oz (56.8 kg)  Height: 5\' 4"  (1.626 m)   Body mass index is 21.49 kg/m.   PHYSICAL EXAM GENERAL: Frail, chronically ill-appearing female in no distress but uncomfortable, frequently shifting in the wheelchair  SKIN: no rash  EYES: sclera clear LUNGS: clear with normal breathing effort HEART: regular rate & rhythm, no lower extremity edema ABDOMEN: Large palpable abdominal mass over the right upper quadrant  NEURO: Awake but drowsy, oriented x 3 with fluent speech.  Generalized weakness PAC without erythema    CBC    Component Value Date/Time   WBC 16.1 (H) 01/20/2023 0755   RBC 2.34 (L) 01/20/2023 0755   HGB 7.4 (L) 01/20/2023 0755   HGB 9.5 (L) 12/17/2022 0921   HCT 21.5 (L) 01/20/2023 0755   PLT 358 01/20/2023 0755   PLT 492 (H) 12/17/2022 0921   MCV 91.9 01/20/2023 0755   MCH 31.6 01/20/2023 0755   MCHC 34.4 01/20/2023 0755   RDW 16.8 (H) 01/20/2023 0755   LYMPHSABS 0.6 (L) 01/20/2023 0755   MONOABS 0.7 01/20/2023 0755   EOSABS 0.1 01/20/2023 0755   BASOSABS 0.0 01/20/2023 0755     CMP     Component Value Date/Time   NA 133 (L) 01/20/2023 0755   K 3.9 01/20/2023 0755   CL 101 01/20/2023 0755   CO2 24 01/20/2023 0755   GLUCOSE 133 (H) 01/20/2023  0755   BUN 5 (L) 01/20/2023 0755   CREATININE 0.39 (L) 01/20/2023 0755   CREATININE 0.48 12/17/2022 0851   CALCIUM 8.7 (L) 01/20/2023 0755   PROT 6.8 01/20/2023 0755   ALBUMIN 2.5 (L) 01/20/2023 0755   AST 15 01/20/2023 0755   AST 21 12/17/2022 0851   ALT 7 01/20/2023 0755   ALT 13 12/17/2022 0851   ALKPHOS 87 01/20/2023 0755   BILITOT 1.3 (H) 01/20/2023 0755   BILITOT 0.6 12/17/2022 0851   GFRNONAA >60 01/20/2023 0755   GFRNONAA >60 12/17/2022 0851     ASSESSMENT & PLAN: 62 year old female   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 Zenia Resides 09/04/2021  -S/p 6 months adjuvant chemotherapy with Xeloda -She developed abdominal pain, bx abd wall mass 09/20/22 confirmed metastatic recurrence of gallbladder cancer  -PET scan 10/01/22 showed no other areas of hypermetabolism.  -She began first line systemic chemotherapy with cisplatin and gemcitabine on days 1, 8 w21 days and durvalumab q21 days on 10/19/22. -she developed more side effects and this was stopped due to progression -Restaging CT in 12/2022 showed progression of the abdominal wall mass and a new right lung nodule. -Michele Arnold appears frail and weak, with anorexia and weight loss.   -Unfortunately due to her persistently low performance status she is no longer a candidate for chemotherapy/cancer treatment -I recommend to shift focus from prolonging her life to improving quality of life, with comfort and supportive care at home with hospice.  This was discussed at length with patient and her husband and they agree -We discussed CODE STATUS, I recommend DNR.  She will discuss with her husband but likely will sign form with admission team at home -They had many questions which I answered to the best of my ability -Pt seen with Dr. Burr Medico who will remain the attending  2. Abdominal  pain -secondary to abdominal wall mass recurrence, #1 -Progressed on first-line chemo and completed palliative radiation -Persistent pain despite radiation, on MS Contin twice daily which I increased to every 8 hours.  Continue oxycodone every 6 hours as needed for breakthrough     PLAN: -Urgent referral to hospice -Increased MS Contin to 1 tab every 8 hours, and continue oxycodone every 6 hours as needed -Discussed DNR, she will likely sign with hospice admission team at home -Rx: Dex 4 mg po daily for appetite  -Pt seen with Dr. Burr Medico  -Will cancel future appointments.      All questions were answered. The patient knows to call the clinic with any problems, questions or concerns. No barriers to learning were detected.  Cira Rue, NP-C 02/02/2023  Addendum I have seen the patient, examined her. I agree with the assessment and and plan and have edited the notes.   Michele Arnold's overall condition has deteriorated further, she is very weak, not eating much, has significant pain.  Her performance status is for now.  She is not a candidate for further chemotherapy.  I recommend hospice and comfort care, she is agreeable.  CODE STATUS reviewed with her, hospice referral made today.  All questions were answered.  I will be happy to maintain as her attending when she is under hospice care.  Truitt Merle  02/02/2023

## 2023-02-16 ENCOUNTER — Other Ambulatory Visit: Payer: Medicaid Other

## 2023-02-16 ENCOUNTER — Encounter: Payer: Medicaid Other | Admitting: Dietician

## 2023-02-16 ENCOUNTER — Ambulatory Visit: Payer: Medicaid Other

## 2023-02-16 ENCOUNTER — Ambulatory Visit: Payer: Medicaid Other | Admitting: Nurse Practitioner

## 2023-02-28 ENCOUNTER — Ambulatory Visit
Admission: RE | Admit: 2023-02-28 | Discharge: 2023-02-28 | Disposition: A | Discharge status: Home / Self Care | Payer: Medicaid Other | Source: Ambulatory Visit | Attending: Radiation Oncology | Admitting: Radiation Oncology

## 2023-02-28 ENCOUNTER — Other Ambulatory Visit: Payer: Self-pay

## 2023-02-28 ENCOUNTER — Telehealth: Payer: Self-pay | Admitting: Hematology

## 2023-02-28 NOTE — Progress Notes (Addendum)
  Radiation Oncology         [phone] ________________________________  Name: Michele Arnold MRN: [id number]  Date of Service: 02/28/2023  DOB: 1961-05-01  Post Treatment Telephone Note  Diagnosis:  Palliative  Radiation Treatment Dates: 01/05/2023 through 01/18/2023 Site Technique Total Dose (Gy) Dose per Fx (Gy) Completed Fx Beam Energies  Abdomen: Abd 3D 30/30 3 10/10 15X   (as documented in provider EOT note)   The patient was not available for call today. Using [employer] (spanish) w/ Ms. Claudia-ID#[id number]. Voicemail unavailable.  The patient does not have a scheduled follow up with her medical oncologist Dr. Mosetta Putt for ongoing surveillance, due to entering hospice care.   Ruel Favors, LPN

## 2023-02-28 NOTE — Telephone Encounter (Signed)
Contacted patient to scheduled appointments. Patient is aware of appointments that are scheduled.   

## 2023-03-03 ENCOUNTER — Inpatient Hospital Stay (HOSPITAL_BASED_OUTPATIENT_CLINIC_OR_DEPARTMENT_OTHER): Payer: Medicaid Other | Admitting: Hematology

## 2023-03-03 ENCOUNTER — Other Ambulatory Visit: Payer: Self-pay

## 2023-03-03 ENCOUNTER — Encounter: Payer: Self-pay | Admitting: Hematology

## 2023-03-03 ENCOUNTER — Other Ambulatory Visit: Payer: Self-pay | Admitting: *Deleted

## 2023-03-03 ENCOUNTER — Other Ambulatory Visit (HOSPITAL_COMMUNITY): Payer: Self-pay

## 2023-03-03 ENCOUNTER — Inpatient Hospital Stay: Payer: Medicaid Other | Attending: Hematology

## 2023-03-03 VITALS — BP 142/83 | HR 86 | Temp 99.0°F | Resp 15 | Ht 64.0 in | Wt 110.2 lb

## 2023-03-03 DIAGNOSIS — G893 Neoplasm related pain (acute) (chronic): Secondary | ICD-10-CM | POA: Insufficient documentation

## 2023-03-03 DIAGNOSIS — R5383 Other fatigue: Secondary | ICD-10-CM | POA: Diagnosis not present

## 2023-03-03 DIAGNOSIS — C23 Malignant neoplasm of gallbladder: Secondary | ICD-10-CM

## 2023-03-03 DIAGNOSIS — B370 Candidal stomatitis: Secondary | ICD-10-CM | POA: Diagnosis not present

## 2023-03-03 DIAGNOSIS — C792 Secondary malignant neoplasm of skin: Secondary | ICD-10-CM | POA: Insufficient documentation

## 2023-03-03 DIAGNOSIS — D649 Anemia, unspecified: Secondary | ICD-10-CM

## 2023-03-03 DIAGNOSIS — Z95828 Presence of other vascular implants and grafts: Secondary | ICD-10-CM

## 2023-03-03 LAB — CBC WITH DIFFERENTIAL/PLATELET
Abs Immature Granulocytes: 0.05 10*3/uL (ref 0.00–0.07)
Basophils Absolute: 0.1 10*3/uL (ref 0.0–0.1)
Basophils Relative: 1 %
Eosinophils Absolute: 0 10*3/uL (ref 0.0–0.5)
Eosinophils Relative: 0 %
HCT: 28.5 % — ABNORMAL LOW (ref 36.0–46.0)
Hemoglobin: 10.2 g/dL — ABNORMAL LOW (ref 12.0–15.0)
Immature Granulocytes: 1 %
Lymphocytes Relative: 9 %
Lymphs Abs: 0.8 10*3/uL (ref 0.7–4.0)
MCH: 31.9 pg (ref 26.0–34.0)
MCHC: 35.8 g/dL (ref 30.0–36.0)
MCV: 89.1 fL (ref 80.0–100.0)
Monocytes Absolute: 0.6 10*3/uL (ref 0.1–1.0)
Monocytes Relative: 6 %
Neutro Abs: 7.4 10*3/uL (ref 1.7–7.7)
Neutrophils Relative %: 83 %
Platelets: 182 10*3/uL (ref 150–400)
RBC: 3.2 MIL/uL — ABNORMAL LOW (ref 3.87–5.11)
RDW: 17.8 % — ABNORMAL HIGH (ref 11.5–15.5)
Smear Review: NORMAL
WBC: 8.8 10*3/uL (ref 4.0–10.5)
nRBC: 0.2 % (ref 0.0–0.2)

## 2023-03-03 LAB — COMPREHENSIVE METABOLIC PANEL
ALT: 11 U/L (ref 0–44)
AST: 15 U/L (ref 15–41)
Albumin: 3.2 g/dL — ABNORMAL LOW (ref 3.5–5.0)
Alkaline Phosphatase: 104 U/L (ref 38–126)
Anion gap: 10 (ref 5–15)
BUN: 12 mg/dL (ref 6–20)
CO2: 27 mmol/L (ref 22–32)
Calcium: 9.5 mg/dL (ref 8.9–10.3)
Chloride: 95 mmol/L — ABNORMAL LOW (ref 98–111)
Creatinine, Ser: 0.36 mg/dL — ABNORMAL LOW (ref 0.44–1.00)
GFR, Estimated: >60 mL/min (ref >60)
Glucose, Bld: 128 mg/dL — ABNORMAL HIGH (ref 70–99)
Potassium: 3.4 mmol/L — ABNORMAL LOW (ref 3.5–5.1)
Sodium: 132 mmol/L — ABNORMAL LOW (ref 135–145)
Total Bilirubin: 1.3 mg/dL — ABNORMAL HIGH (ref 0.3–1.2)
Total Protein: 6.9 g/dL (ref 6.5–8.1)

## 2023-03-03 LAB — SAMPLE TO BLOOD BANK

## 2023-03-03 MED ORDER — HEPARIN SOD (PORK) LOCK FLUSH 100 UNIT/ML IV SOLN
500.0000 [IU] | INTRAVENOUS | Status: AC | PRN
  Administered 2023-03-03: 500 [IU]

## 2023-03-03 MED ORDER — SODIUM CHLORIDE 0.9% FLUSH
10.0000 mL | INTRAVENOUS | Status: AC | PRN
  Administered 2023-03-03: 10 mL

## 2023-03-03 MED ORDER — NYSTATIN 100000 UNIT/ML MT SUSP: 5.0000 mL | Freq: Three times a day (TID) | OROMUCOSAL | 1 refills | Status: DC | PRN

## 2023-03-03 MED ORDER — FLUCONAZOLE 100 MG PO TABS: 100.0000 mg | ORAL_TABLET | Freq: Every day | ORAL | 1 refills | Status: DC

## 2023-03-03 MED ORDER — NYSTATIN 100000 UNIT/ML MT SUSP
5.0000 mL | Freq: Three times a day (TID) | OROMUCOSAL | 1 refills | Status: DC | PRN
  Filled 2023-03-03: qty 140, 10d supply, fill #0

## 2023-03-03 MED ORDER — ONDANSETRON 4 MG PO TBDP: 4.0000 mg | ORAL_TABLET | Freq: Three times a day (TID) | ORAL | 1 refills | Status: DC | PRN

## 2023-03-03 MED ORDER — SCOPOLAMINE 1 MG/3DAYS TD PT72: 1.0000 | MEDICATED_PATCH | TRANSDERMAL | 1 refills | Status: DC

## 2023-03-03 NOTE — Assessment & Plan Note (Signed)
With metastatic recurrence in 09/2022, MSS -Initially stage II diagnosed in 07/2021, s/p surgical resection  -she received adjuvant chemotherapy with Xeloda for 6 months.  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 -she started neoadjuvant chemo cisplatin, gemcitabine and durvalumab on 10/19/22, she initially tolerated well but developed more side effects later on, stopped due to cancer progression  -Her recent restaging CT in 12/2022 showed cancer progression and a new right lung nodule -Due to her significant pain from her abdominal wall metastasis, she recently finished palliative RT -FO showed low mutation burden, PIK3CA mutation(+), no other targetable mutations

## 2023-03-03 NOTE — Progress Notes (Signed)
[facility]   [phone] [phone]   Clinic Follow up Note   Patient Care Team: Joseph Art, DO as PCP - General (Internal Medicine) Fritzi Mandes, MD as Consulting Physician (General Surgery) Malachy Mood, MD as Consulting Physician (Hematology) Diagnostic Radiology & Imaging, [facility] as Consulting Physician (Radiology)  Date of Service:  03/03/2023  CHIEF COMPLAINT: f/u of recurrent gallbladder cancer   CURRENT THERAPY:    ASSESSMENT:  Michele Arnold is a 62 y.o. 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 -she started neoadjuvant chemo cisplatin, gemcitabine and durvalumab on 10/19/22, she initially tolerated well but developed more side effects later on, stopped due to cancer progression  -Her recent restaging CT in 12/2022 showed cancer progression and a new right lung nodule -Due to her significant pain from her abdominal wall metastasis, she recently finished palliative RT -FO showed low mutation burden, PIK3CA mutation(+), no other targetable mutations  -Due to her overall poor performance status, she was referred to home hospice a month ago, she is pleased with the hospice service at home. -She is very weak, not eating food, limited to oral liquid intake, she has oral thrush, we discussed symptom management.  I recommend continue home hospice.  We also reviewed the residential hospice if she needs.  Her life expectancy is likely a few weeks to months  Oral thrush -He could not tolerate oral nystatin liquid due to severe burning sensation from medicine -I will call  in Magic mouthwash with lidocaine and nystatin, and oral fluconazole 100 mg a day for 2 weeks.  Nausea, pain, and oral secretion -Continue current pain medication, I encouraged her to use oxycodone as needed for breakthrough -I also called in Zofran and ODT -I called in a scopolamine patch for her increased oral secretion   PLAN: -I prescribe Magic Mouth wash for pt due to oral thrush  -I prescribe Fluconazole 100 mg due to oral thrush -I prescribe Zofran ODT for nausea and vomiting. -I recommend increase Hydration intake when fungal gets better -I prescribe Scopolamine for secretion -lab reviewed-stable -She will continue home hospice care, I will talk to her hospice nurse today. -Follow-up as needed.   SUMMARY OF ONCOLOGIC HISTORY: Oncology History Overview Note   Cancer Staging  Adenocarcinoma of gallbladder [facility]) Staging form: Gallbladder, AJCC 8th Edition - Clinical stage from 09/04/2021: Stage IIA (cT2a, cN0, cM0) - Signed by Pollyann Samples, NP on 09/21/2021 Stage prefix: Initial diagnosis Total positive nodes: 0 Histologic grade (G): G3 Histologic grading system: 3 grade system Histologic sub-type: Adenocarcinoma     Adenocarcinoma of gallbladder  07/21/2021 Imaging   CT AP IMPRESSION: 1. Cholelithiasis with distended gallbladder. No CT evidence of acute cholecystitis. Consider further evaluation with right upper quadrant ultrasound as clinically indicated. 2. Punctate nonobstructive left nephrolithiasis. 3. Aortic Atherosclerosis (ICD10-I70.0).   07/21/2021 Imaging   ABD [place] RUQ IMPRESSION: 1. Cholelithiasis without evidence of acute cholecystitis. 2. Mild hepatic steatosis.     07/21/2021 Imaging   MRA/MRCP IMPRESSION: 1. No biliary ductal dilation. No choledocholithiasis. 2. Mild hepatic steatosis. 3. Cholelithiasis with dilation of the gallbladder and trace pericholecystic fluid. No gallbladder wall thickening or abnormal  wall enhancement. Findings which are  equivocal for acute cholecystitis. Consider further evaluation with nuclear medicine HIDA scan to assess cystic duct patency if clinically indicated.   07/22/2021 Imaging   HIDA SCAN IMPRESSION: Scintigraphic findings most consistent with acute cholecystitis.   07/23/2021 Surgery   Preop Dx:        Acute cholecystitis Postop Dx:      Acute cholecystitis with 1 cm stone impacted in the infundibulum Procedure:      Xi robotic cholecystectomy with ICG By Dr. Luretha Murphy   07/23/2021 Pathology Results   FINAL MICROSCOPIC DIAGNOSIS:  A. GALLBLADDER, CHOLECYSTECTOMY:  - Invasive poorly differentiated adenocarcinoma, 4.3 cm, involving  gallbladder neck  - Carcinoma invades perimuscular soft tissue  - Cystic duct margin is focally positive for carcinoma  - Focally suspicious for lymphovascular invasion   Procedure: Cholecystectomy  Tumor Site: Gallbladder neck  Tumor Size: 4.3 cm  Histologic Type: Adenocarcinoma  Histologic Grade: G3: Poorly differentiated  Tumor Extension: Tumor invades perimuscular connective tissue on the peritoneal side without serosal involvement  Margins:       Margin Status for Invasive Carcinoma: Cystic duct margin is positive for carcinoma  Regional Lymph Nodes: Not applicable (no lymph nodes submitted or found)  Distant Metastasis:       Distant Site(s) Involved: Not applicable  Pathologic Stage Classification (pTNM, AJCC 8th Edition): pT2a, pN not assigned    08/20/2021 Imaging   CT chest IMPRESSION: Negative. No CT evidence for acute intrathoracic abnormality. Negative for pulmonary nodule or evidence for metastatic disease to the chest.   09/01/2021 Tumor Marker   CA 19-9: 8 (normal)   09/04/2021 Initial Diagnosis   Adenocarcinoma of gallbladder (HCC)   09/04/2021 Definitive Surgery   Procedure: by Dr. Sophronia Simas Staging laparoscopy Exploratory laparotomy with intraoperative cholangiogram Excision of new cystic duct margin Partial central  hepatectomy (resection of gallbladder fossa - segments 4b and 5) Portal lymph node dissection   09/04/2021 Pathology Results   FINAL MICROSCOPIC DIAGNOSIS:  A. CYSTIC DUCT, NEW MARGIN, EXCISION:  -  No adenocarcinoma identified  B. LYMPH NODE, PORTAL, EXCISION:  -  Nodular fat necrosis and fibrosis  -  No nodal tissue identified  C. GALLBLADDER, FOSSA, EXCISION:  -  Benign liver with periductular chronic inflammation and  macrovesicular steatosis  -  No adenocarcinoma identified  D. LYMPH NODE, STATION EIGHT, EXCISION:  -  No adenocarcinoma identified in one lymph node (0/1)    09/04/2021 Cancer Staging   Staging form: Gallbladder, AJCC 8th Edition - Clinical stage from 09/04/2021: Stage IIA (cT2a, cN0, cM0) - Signed by Pollyann Samples, NP on 09/21/2021 Stage prefix: Initial diagnosis Total positive nodes: 0 Histologic grade (G): G3 Histologic grading system: 3 grade system Histologic sub-type: Adenocarcinoma   09/28/2021 - 09/28/2021 Chemotherapy   Patient is on Treatment Plan : BREAST Capecitabine q21d     09/30/2022 Relapse/Recurrence   FINAL MICROSCOPIC DIAGNOSIS:  A. 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  It is composed of solid sheets and nests of atypical cells with variably enlarged round to oval irregular nuclei and a variable amount of eosinophilic focally vacuolated cytoplasm, and these sheets and nests are admixed with infiltrating irregular angulated glands lined by an atypical columnar epithelium composed of similar-appearing cells.  Glandular lumens contain mucin.  Focally these epithelia are contiguous and are present within the  same sheet and/or gland. Five immunohistochemical stains performed with adequate control. The tumor is positive for cytokeratin 7  and the solid sheets and nests are positive for the squamous marker p40.  The tumor is negative for cytokeratin 20.  The tumor is also negative for the GI marker CDX2.  The tumor is negative for the hepatic marker arginase.  The prior cholecystectomy specimen ([id number]) is reviewed and this carcinoma shows identical cytohistomorphology as the previously diagnosed gallbladder adenocarcinoma.    10/01/2022 PET scan   IMPRESSION: 1. Progressive soft tissue mass involving the right lower quadrant ventral abdominal wall is intensely FDG avid compatible with recurrent tumor. 2. No signs of FDG avid nodal or solid organ metastasis. 3. Multiple fat containing ventral abdominal wall hernias. 4.  Aortic Atherosclerosis (ICD10-I70.0).   10/12/2022 - 10/12/2022 Chemotherapy   Patient is on Treatment Plan : BILIARY TRACT Cisplatin + Gemcitabine D1,8 q21d     10/12/2022 - 10/12/2022 Chemotherapy   Patient is on Treatment Plan : BLADDER Durvalumab (10) q14d     10/19/2022 - 12/02/2022 Chemotherapy   Patient is on Treatment Plan : BILIARY TRACT Cisplatin + Gemcitabine D1,8 q21d     10/19/2022 - 12/02/2022 Chemotherapy   Patient is on Treatment Plan : BLADDER Durvalumab (10) q14d     12/23/2022 Imaging    IMPRESSION: 1. Interval increase in size of right lower quadrant ventral abdominal wall mass compatible with progression of metastatic disease. 2. Round nodular density is only seen on the first and second images localizing to the right middle lobe measuring 1.2 cm. This is incompletely characterized and may represent a new lung nodule. Consider more definitive characterization with CT of the chest. 3. Nonobstructing left renal calculi. 4. Fat containing umbilical hernia.   01/07/2023 Imaging    IMPRESSION: 1. Examination is somewhat limited by breath motion artifact throughout. 2. Spiculated appearing fissural nodule of the posterolateral segment right middle lobe abutting the minor fissure  measuring 1.0 x 1.0 cm corresponding to finding of prior CT abdomen pelvis. This is new compared to most recent prior imaging of the chest and prior PET-CT, and is worrisome for a pulmonary metastasis, or perhaps alternately primary lung malignancy. 3. No evidence of lymph   01/31/2023 - 01/31/2023 Chemotherapy   Patient is on Treatment Plan : COLORECTAL FOLFOX q14d x 6 months        INTERVAL HISTORY:  Michele Arnold is here for a follow up of recurrent gallbladder cancer . She was last seen by  NP Lacie on 02/02/2023. She presents to the clinic accompanied with family. Pt tongue is inflamed with thrush, she is unable to eat and drink . Pt state that everything taste sour. Pt reports of having stomach pain , and she take her pain medication q12 hrs. Pt state that Hospice care has been a big help. Pt also looking into getting a home health aid. Pt husband states that she has vomiting a yellow substance about a week ago.     All other systems were reviewed with the patient and are negative.  MEDICAL HISTORY:  Past Medical History:  Diagnosis Date   GB CA    Hypertension     SURGICAL HISTORY: Past Surgical History:  Procedure Laterality Date   CHOLECYSTECTOMY  07/23/2021   INTRAOPERATIVE CHOLANGIOGRAM N/A 09/04/2021   Procedure: INTRAOPERATIVE CHOLANGIOGRAM;  Surgeon: Fritzi Mandes, MD;  Location: [facility] OR;  Service: General;  Laterality: N/A;   LAPAROSCOPY N/A 09/04/2021   Procedure: STAGING LAPAROSCOPY;  Surgeon:  Fritzi Mandes, MD;  Location: [facility] OR;  Service: General;  Laterality: N/A;   LYMPH NODE DISSECTION N/A 09/04/2021   Procedure: PORTAL LYMPH NODE DISSECTION;  Surgeon: Fritzi Mandes, MD;  Location: [facility] OR;  Service: General;  Laterality: N/A;   OPEN HEPATECTOMY  N/A 09/04/2021   Procedure: OPEN PARTIAL CENTRAL HEPATECTOMY;  Surgeon: Fritzi Mandes, MD;  Location: [facility] OR;  Service: General;  Laterality: N/A;   PORTACATH PLACEMENT Right 10/15/2022   Procedure: INSERTION  PORT-A-CATH;  Surgeon: Fritzi Mandes, MD;  Location: [facility] OR;  Service: General;  Laterality: Right;    I have reviewed the social history and family history with the patient and they are unchanged from previous note.  ALLERGIES:  has No Known Allergies.  MEDICATIONS:  Current Outpatient Medications  Medication Sig Dispense Refill   fluconazole (DIFLUCAN) 100 MG tablet Take 1 tablet (100 mg total) by mouth daily. For oral fungal infection 14 tablet 1   ondansetron (ZOFRAN-ODT) 4 MG disintegrating tablet Take 1 tablet (4 mg total) by mouth every 8 (eight) hours as needed for nausea or vomiting. 20 tablet 1   scopolamine (TRANSDERM-SCOP) 1 MG/3DAYS Place 1 patch (1.5 mg total) onto the skin every 3 (three) days. 10 patch 1   acetaminophen (TYLENOL) 325 MG tablet Take 2 tablets (650 mg total) by mouth every 6 (six) hours as needed for mild pain or fever. (Patient not taking: Reported on 02/02/2023)     dexamethasone (DECADRON) 4 MG tablet Take 1 tablet (4 mg total) by mouth daily. 30 tablet 1   magic mouthwash (nystatin, lidocaine, diphenhydrAMINE, alum & mag hydroxide) suspension Swish and swallow 5 mLs by mouth 3 (three) times daily as needed for mouth pain. 140 mL 1   megestrol (MEGACE) 40 MG tablet Take 10 tablets (400 mg total) by mouth daily. 150 tablet 1   morphine (MS CONTIN) 15 MG 12 hr tablet Take 1 tablet (15 mg total) by mouth every 8 (eight) hours. 90 tablet 0   nystatin (MYCOSTATIN) 100000 UNIT/ML suspension Take 5 mLs by mouth 4 times daily. Swish and swallow. (Patient not taking: Reported on 02/02/2023) 60 mL 0   oxyCODONE (OXY IR/ROXICODONE) 5 MG immediate release tablet Take 1 tablet (5 mg total) by mouth every 6 (six) hours as needed for severe pain. 60 tablet 0   prochlorperazine (COMPAZINE) 10 MG tablet Take 10 mg by mouth every 6 (six) hours as needed for nausea or vomiting.     urea (CARMOL) 10 % cream Apply topically to hands and feet 2 to 3 times daily as needed (Patient not  taking: Reported on 02/02/2023) 71 g 2   No current facility-administered medications for this visit.    PHYSICAL EXAMINATION: ECOG PERFORMANCE STATUS: 3 - Symptomatic, >50% confined to bed  Vitals:   03/03/23 1007  BP: (!) 142/83  Pulse: 86  Resp: 15  Temp: 99 F (37.2 C)  SpO2: 100%   Wt Readings from Last 3 Encounters:  03/03/23 110 lb 3.2 oz (50 kg)  02/02/23 125 lb 3.2 oz (56.8 kg)  01/20/23 136 lb (61.7 kg)    GENERAL:alert, no distress and comfortable SKIN: skin color, texture, turgor are normal, no rashes or significant lesions EYES: normal, Conjunctiva are pink and non-injected, sclera clear {OROPHARYNX:(-) no exudate, (-)no erythema and lips, buccal mucosa, and(+)  tongue fungal infection ABDOMEN:(+) abdomen soft,(+) tender and normal bowel sounds LABORATORY DATA:  I have reviewed the data as listed    Latest Ref  Rng & Units 03/03/2023    9:36 AM 01/20/2023    7:55 AM 01/14/2023    4:01 PM  CBC  WBC 4.0 - 10.5 K/uL 8.8  16.1  19.1   Hemoglobin 12.0 - 15.0 g/dL 16.1  7.4  8.1   Hematocrit 36.0 - 46.0 % 28.5  21.5  24.6   Platelets 150 - 400 K/uL 182  358  263         Latest Ref Rng & Units 03/03/2023    9:36 AM 01/20/2023    7:55 AM 01/14/2023    4:01 PM  CMP  Glucose 70 - 99 mg/dL 096  045  409   BUN 6 - 20 mg/dL 12  5  9    Creatinine 0.44 - 1.00 mg/dL 8.11  9.14  7.82   Sodium 135 - 145 mmol/L 132  133  130   Potassium 3.5 - 5.1 mmol/L 3.4  3.9  4.3   Chloride 98 - 111 mmol/L 95  101  97   CO2 22 - 32 mmol/L 27  24  26    Calcium 8.9 - 10.3 mg/dL 9.5  8.7  95.6   Total Protein 6.5 - 8.1 g/dL 6.9  6.8  6.9   Total Bilirubin 0.3 - 1.2 mg/dL 1.3  1.3  1.3   Alkaline Phos 38 - 126 U/L 104  87  98   AST 15 - 41 U/L 15  15  16    ALT 0 - 44 U/L 11  7  7        RADIOGRAPHIC STUDIES: I have personally reviewed the radiological images as listed and agreed with the findings in the report. No results found.    No orders of the defined types were placed in this  encounter.  All questions were answered. The patient knows to call the clinic with any problems, questions or concerns. No barriers to learning was detected. The total time spent in the appointment was 25 minutes.     Malachy Mood, MD 03/03/2023   Carolin Coy, CMA, am acting as scribe for Malachy Mood, MD.   I have reviewed the above documentation for accuracy and completeness, and I agree with the above.

## 2023-03-04 ENCOUNTER — Other Ambulatory Visit (HOSPITAL_COMMUNITY): Payer: Self-pay

## 2023-04-08 NOTE — Progress Notes (Signed)
[facility] Form for Request for Assessment for Personal Care Services completed as requested by Patient. Fax transmission confirmation received. Copy of form mailed to Patient per request. No other needs or concerns noted at this time.

## 2023-04-16 DEATH — deceased

## 2023-08-18 IMAGING — MR MR ABDOMEN WO/W CM MRCP
19 of 20 series · 46 of 48 positions shown · IV contrast (7ml GADAVIST)
Comparison: 07/21/2021

CLINICAL DATA: Gallbladder adenocarcinoma

EXAM:
MRI ABDOMEN WITHOUT AND WITH CONTRAST (INCLUDING MRCP)
TECHNIQUE: Multiplanar multisequence MR imaging of the abdomen was performed
both before and after the administration of intravenous contrast.
Heavily T2-weighted images of the biliary and pancreatic ducts were
obtained, and three-dimensional MRCP images were rendered by post
processing.
CONTRAST:  7mL GADAVIST GADOBUTROL 1 MMOL/ML IV SOLN

[Series 3: DWI · axial · 6.0mm · 1.49mm/px · z∈[-63,+189]mm · 3 of 72 slices shown (1 of 2)]
[im 1/72]
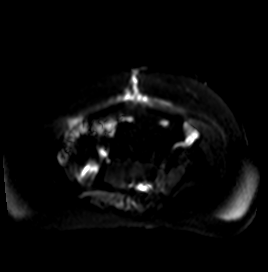
[im 36/72]
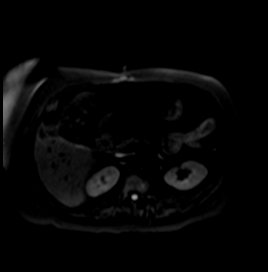
[im 72/72]
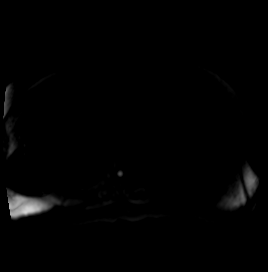

[Series 4: DWI · axial · 6.0mm · 1.49mm/px · z∈[-63,+189]mm · 2 of 36 slices shown (2 of 2)]
[im 1/36]
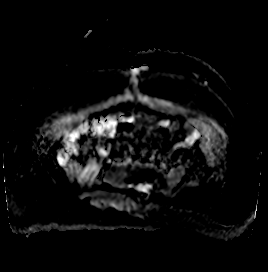
[im 36/36]
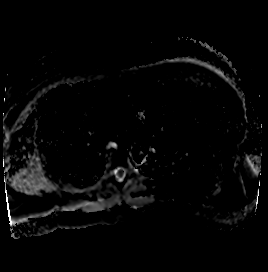

[Series 5: T2 fat-sat · axial · 6.0mm · 1.31mm/px · 1 of 34 slices shown]
[im 1/34]
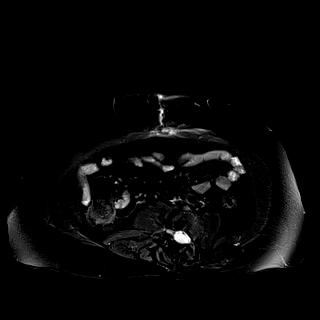

[Series 8: cor_3d_spc_trig · coronal · 1.0mm · 0.49mm/px · 2 of 72 slices shown]
[im 1/72]
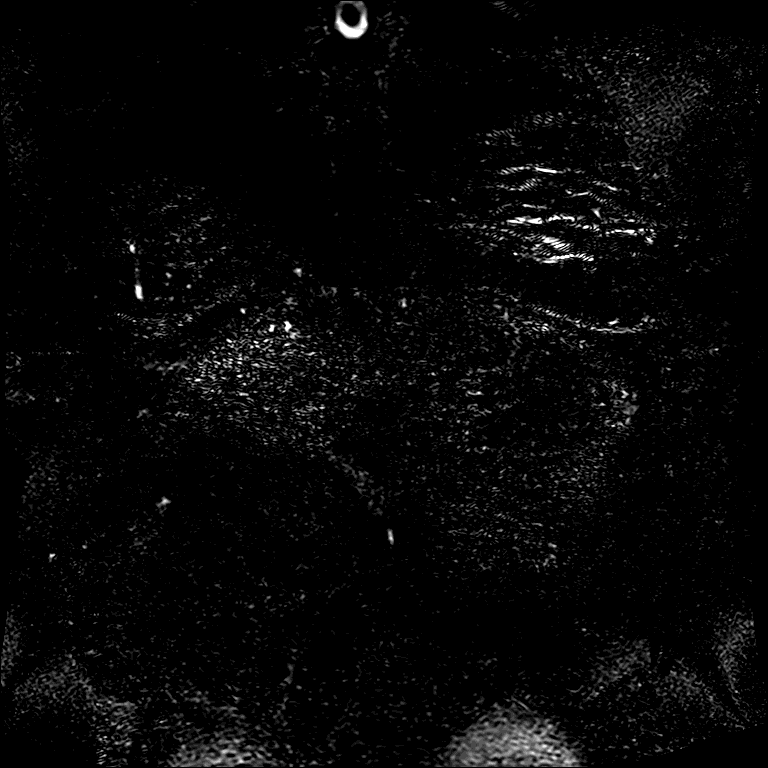
[im 72/72]
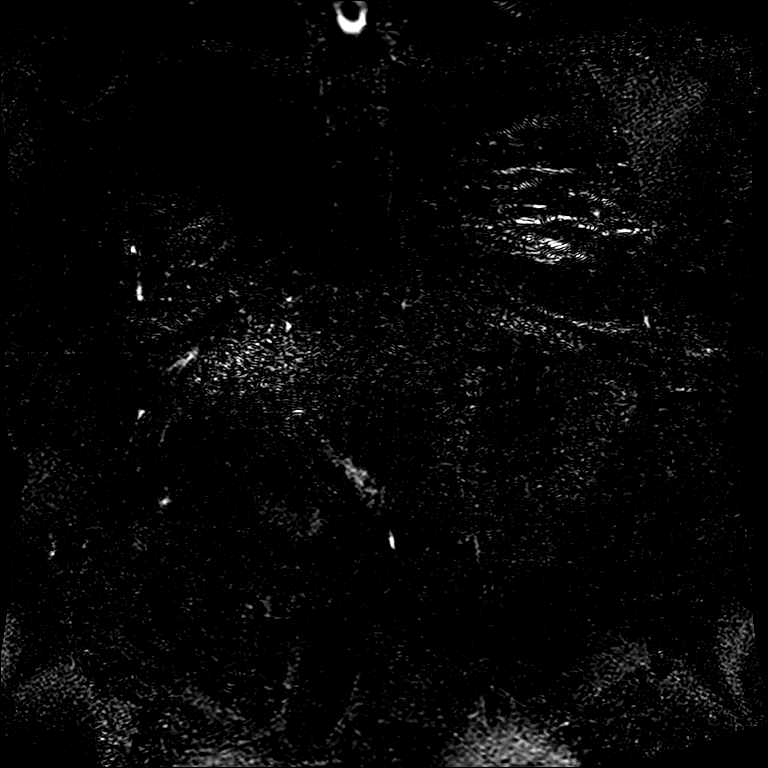

[Series 11: T2 · coronal · 6.0mm · 1.48mm/px · 1 of 30 slices shown (1 of 2)]
[im 1/30]
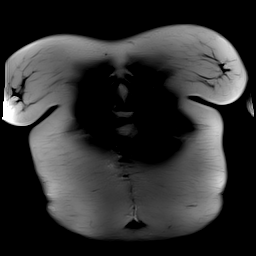

[Series 12: T1 · axial · 3.0mm · 1.25mm/px · z∈[-62,+199]mm · 3 of 88 slices shown (1 of 2)]
[im 1/88]
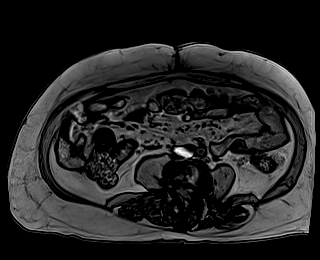
[im 44/88]
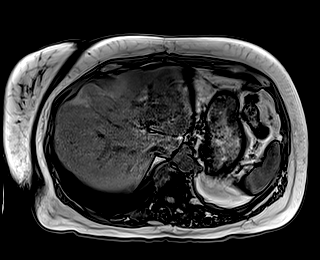
[im 88/88]
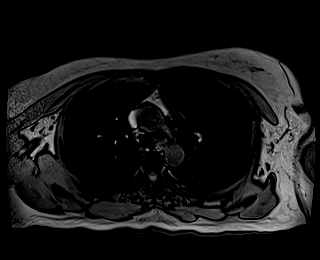

[Series 13: T1 · axial · 3.0mm · 1.25mm/px · z∈[-62,+199]mm · 3 of 88 slices shown (2 of 2)]
[im 1/88]
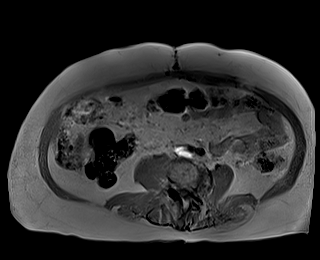
[im 44/88]
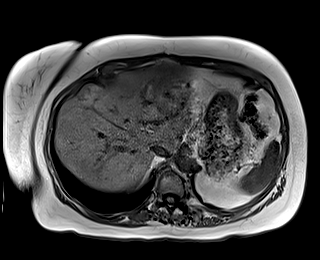
[im 88/88]
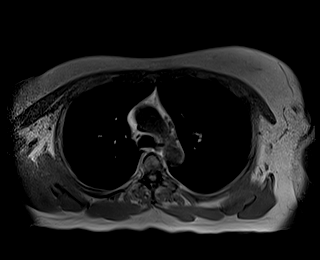

[Series 14: T2 · axial · 6.0mm · 1.56mm/px · 1 of 40 slices shown (2 of 2)]
[im 1/40]
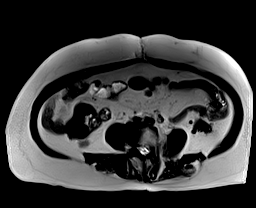

[Series 17: T1 dynamic · axial · 3.0mm · 1.25mm/px · z∈[-66,+195]mm · 3 of 88 slices shown (1 of 10)]
[im 1/88]
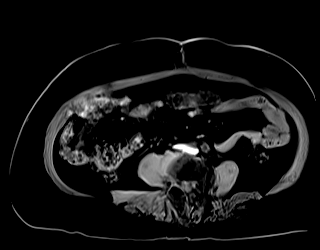
[im 44/88]
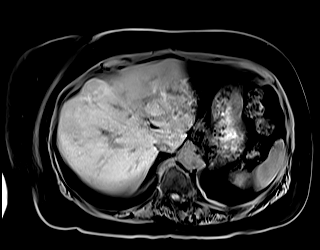
[im 88/88]
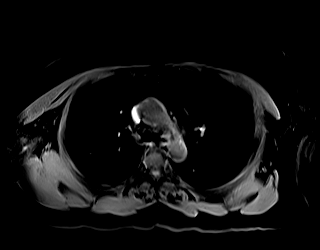

[Series 18: cor obl thk · sagittal · 50.0mm · 0.78mm/px · 1 of 8 slices shown]
[im 1/8]
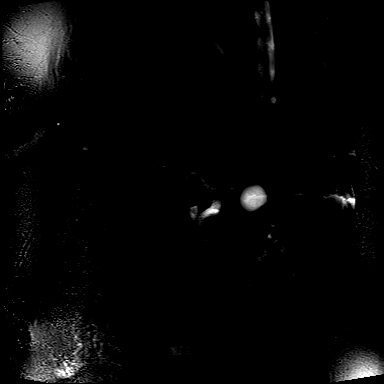

[Series 22: T1 dynamic · axial · 3.0mm · 1.25mm/px · z∈[-66,+195]mm · 3 of 88 slices shown (2 of 10)]
[im 1/88]
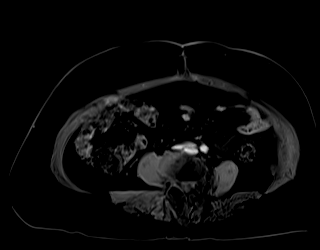
[im 44/88]
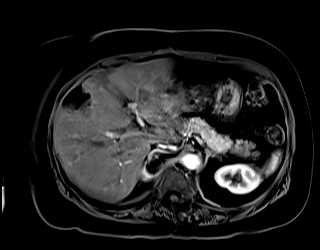
[im 88/88]
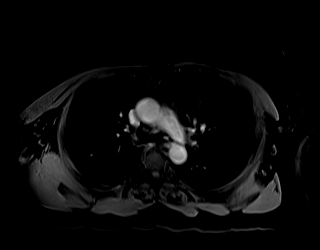

[Series 23: T1 dynamic · axial · 3.0mm · 1.25mm/px · z∈[-66,+195]mm · 3 of 88 slices shown (3 of 10)]
[im 1/88]
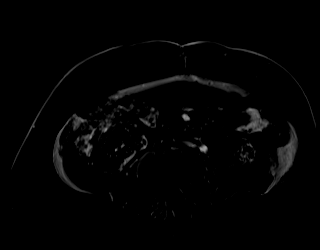
[im 44/88]
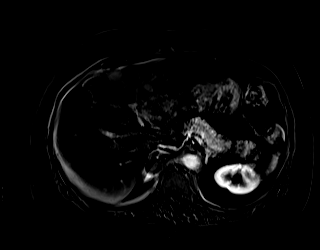
[im 88/88]
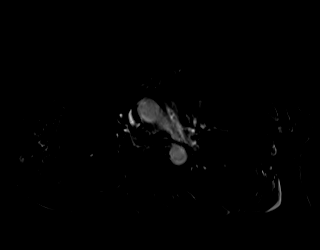

[Series 26: T1 dynamic · axial · 3.0mm · 1.25mm/px · z∈[-66,+195]mm · 3 of 88 slices shown (4 of 10)]
[im 1/88]
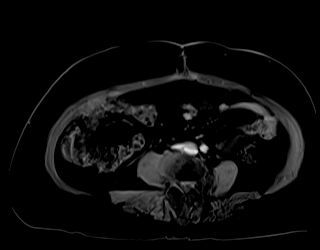
[im 44/88]
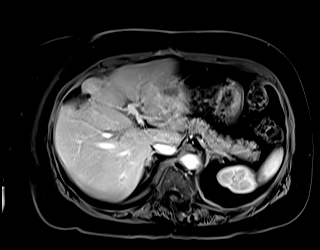
[im 88/88]
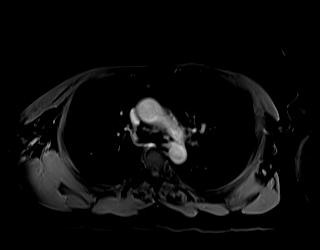

[Series 27: T1 dynamic · axial · 3.0mm · 1.25mm/px · z∈[-66,+195]mm · 3 of 88 slices shown (5 of 10)]
[im 1/88]
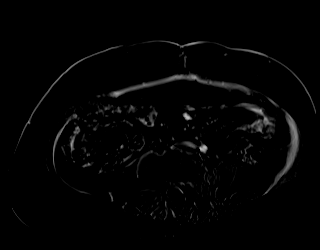
[im 44/88]
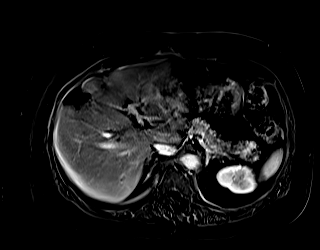
[im 88/88]
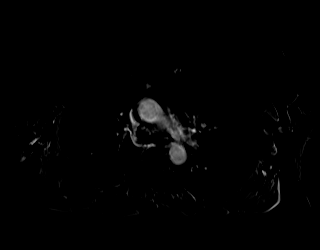

[Series 30: T1 dynamic · axial · 3.0mm · 1.25mm/px · z∈[-66,+195]mm · 3 of 88 slices shown (6 of 10)]
[im 1/88]
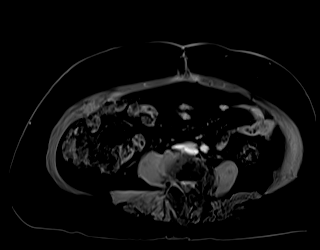
[im 44/88]
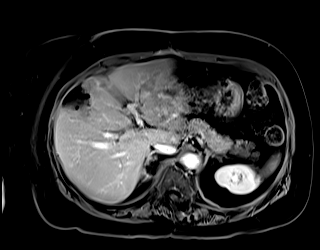
[im 88/88]
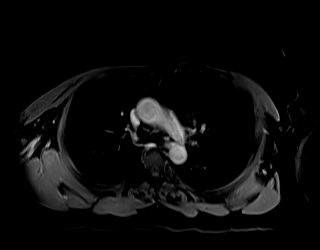

[Series 31: T1 dynamic · axial · 3.0mm · 1.25mm/px · z∈[-66,+195]mm · 3 of 88 slices shown (7 of 10)]
[im 1/88]
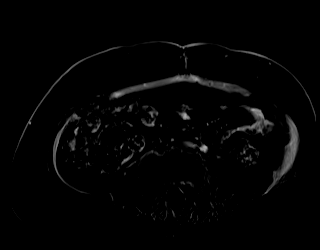
[im 44/88]
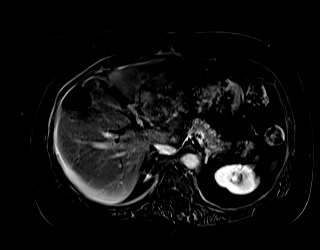
[im 88/88]
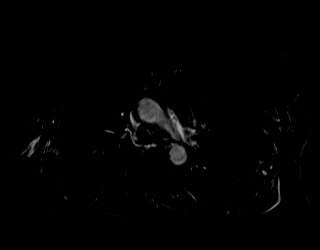

[Series 33: T1 dynamic · coronal · 3.0mm · 1.41mm/px · 2 of 72 slices shown (8 of 10)]
[im 1/72]
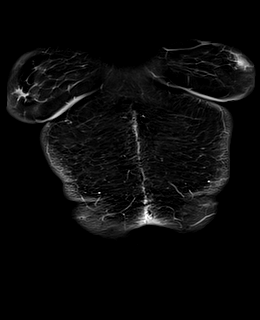
[im 72/72]
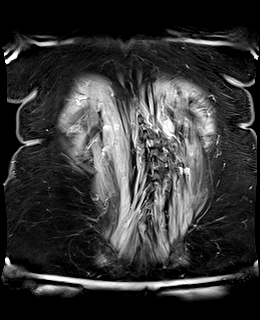

[Series 36: T1 dynamic · axial · 3.0mm · 1.25mm/px · z∈[-66,+195]mm · 3 of 88 slices shown (9 of 10)]
[im 1/88]
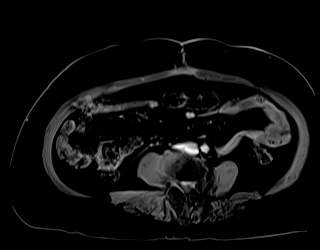
[im 44/88]
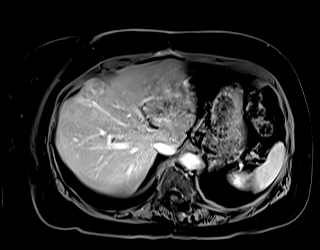
[im 88/88]
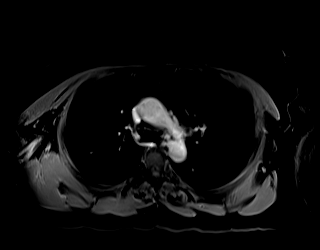

[Series 37: T1 dynamic · axial · 3.0mm · 1.25mm/px · z∈[-66,+195]mm · 3 of 88 slices shown (10 of 10)]
[im 1/88]
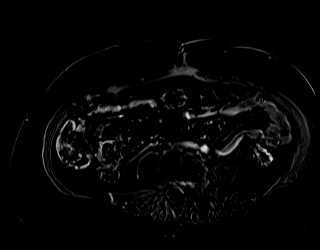
[im 44/88]
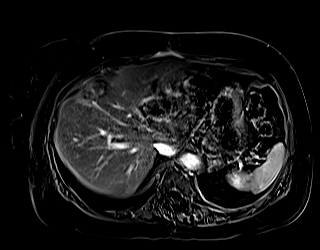
[im 88/88]
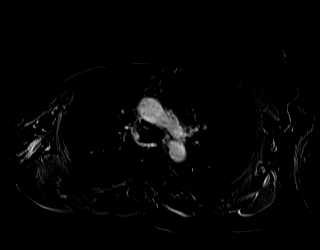

[46 of 48 positions shown; findings below may reference images not displayed]

FINDINGS: Lower chest: No acute findings.

Hepatobiliary: No mass or other parenchymal abnormality identified.
Status post interval cholecystectomy. No biliary ductal dilatation.

Pancreas: No mass, inflammatory changes, or other parenchymal
abnormality identified.No pancreatic ductal dilatation.

Spleen:  Within normal limits in size and appearance.

Adrenals/Urinary Tract: Normal adrenal glands. Benign simple cyst of
the superior pole of the right kidney. No renal masses or suspicious
contrast enhancement identified. No evidence of hydronephrosis.

Stomach/Bowel: Visualized portions within the abdomen are
unremarkable.

Vascular/Lymphatic: No pathologically enlarged lymph nodes
identified. No abdominal aortic aneurysm demonstrated.

Other:  None.

Musculoskeletal: No suspicious osseous lesions identified.
Levoscoliosis of the lumbar spine.
IMPRESSION: 1. Status post interval cholecystectomy. No biliary ductal
dilatation.
2. No evidence of intra-abdominal mass, suspicious contrast
enhancement, lymphadenopathy, or metastatic disease per report of
gallbladder adenocarcinoma.
